# Patient Record
Sex: Female | Born: 1950 | Race: White | Hispanic: No | Smoking: Never smoker
Health system: Southern US, Community
[De-identification: ages and names within clinical notes are randomized; demographics above are authoritative.]

## PROBLEM LIST (undated history)

## (undated) DIAGNOSIS — I10 Essential (primary) hypertension: Secondary | ICD-10-CM

## (undated) DIAGNOSIS — E119 Type 2 diabetes mellitus without complications: Secondary | ICD-10-CM

## (undated) HISTORY — PX: CARDIAC CATHETERIZATION: SHX172

## (undated) HISTORY — PX: CHOLECYSTECTOMY: SHX55

---

## 2018-01-09 DIAGNOSIS — Z794 Long term (current) use of insulin: Secondary | ICD-10-CM

## 2018-01-10 DIAGNOSIS — E0590 Thyrotoxicosis, unspecified without thyrotoxic crisis or storm: Secondary | ICD-10-CM | POA: Diagnosis present

## 2018-02-15 DIAGNOSIS — I483 Typical atrial flutter: Secondary | ICD-10-CM | POA: Insufficient documentation

## 2018-02-16 DIAGNOSIS — Z6841 Body Mass Index (BMI) 40.0 and over, adult: Secondary | ICD-10-CM

## 2018-02-16 DIAGNOSIS — Z7901 Long term (current) use of anticoagulants: Secondary | ICD-10-CM

## 2018-02-16 DIAGNOSIS — I25810 Atherosclerosis of coronary artery bypass graft(s) without angina pectoris: Secondary | ICD-10-CM | POA: Diagnosis present

## 2018-09-26 DIAGNOSIS — J9611 Chronic respiratory failure with hypoxia: Secondary | ICD-10-CM | POA: Diagnosis present

## 2018-09-26 DIAGNOSIS — E785 Hyperlipidemia, unspecified: Secondary | ICD-10-CM | POA: Diagnosis present

## 2018-09-26 DIAGNOSIS — I480 Paroxysmal atrial fibrillation: Secondary | ICD-10-CM | POA: Diagnosis present

## 2019-02-05 DIAGNOSIS — I361 Nonrheumatic tricuspid (valve) insufficiency: Secondary | ICD-10-CM

## 2019-02-05 DIAGNOSIS — I340 Nonrheumatic mitral (valve) insufficiency: Secondary | ICD-10-CM | POA: Diagnosis not present

## 2019-12-07 ENCOUNTER — Encounter (HOSPITAL_COMMUNITY): Payer: Self-pay | Admitting: Emergency Medicine

## 2019-12-07 ENCOUNTER — Inpatient Hospital Stay (HOSPITAL_COMMUNITY)
Admission: EM | Admit: 2019-12-07 | Discharge: 2019-12-17 | DRG: 481 | Disposition: A | Discharge status: Skilled Nursing Facility | Payer: Medicare PPO | Attending: Internal Medicine | Admitting: Internal Medicine

## 2019-12-07 ENCOUNTER — Other Ambulatory Visit: Payer: Self-pay

## 2019-12-07 ENCOUNTER — Emergency Department (HOSPITAL_COMMUNITY): Payer: Medicare PPO

## 2019-12-07 DIAGNOSIS — K5900 Constipation, unspecified: Secondary | ICD-10-CM | POA: Diagnosis present

## 2019-12-07 DIAGNOSIS — J45909 Unspecified asthma, uncomplicated: Secondary | ICD-10-CM | POA: Diagnosis present

## 2019-12-07 DIAGNOSIS — Y92018 Other place in single-family (private) house as the place of occurrence of the external cause: Secondary | ICD-10-CM | POA: Diagnosis not present

## 2019-12-07 DIAGNOSIS — D62 Acute posthemorrhagic anemia: Secondary | ICD-10-CM | POA: Diagnosis not present

## 2019-12-07 DIAGNOSIS — Y839 Surgical procedure, unspecified as the cause of abnormal reaction of the patient, or of later complication, without mention of misadventure at the time of the procedure: Secondary | ICD-10-CM | POA: Diagnosis not present

## 2019-12-07 DIAGNOSIS — Z880 Allergy status to penicillin: Secondary | ICD-10-CM | POA: Diagnosis not present

## 2019-12-07 DIAGNOSIS — Z20822 Contact with and (suspected) exposure to covid-19: Secondary | ICD-10-CM | POA: Diagnosis present

## 2019-12-07 DIAGNOSIS — M62838 Other muscle spasm: Secondary | ICD-10-CM | POA: Diagnosis present

## 2019-12-07 DIAGNOSIS — Z6841 Body Mass Index (BMI) 40.0 and over, adult: Secondary | ICD-10-CM

## 2019-12-07 DIAGNOSIS — S72142A Displaced intertrochanteric fracture of left femur, initial encounter for closed fracture: Principal | ICD-10-CM

## 2019-12-07 DIAGNOSIS — Z881 Allergy status to other antibiotic agents status: Secondary | ICD-10-CM

## 2019-12-07 DIAGNOSIS — T148XXA Other injury of unspecified body region, initial encounter: Secondary | ICD-10-CM

## 2019-12-07 DIAGNOSIS — Z951 Presence of aortocoronary bypass graft: Secondary | ICD-10-CM | POA: Diagnosis not present

## 2019-12-07 DIAGNOSIS — S40011A Contusion of right shoulder, initial encounter: Secondary | ICD-10-CM | POA: Diagnosis present

## 2019-12-07 DIAGNOSIS — Z79899 Other long term (current) drug therapy: Secondary | ICD-10-CM

## 2019-12-07 DIAGNOSIS — I252 Old myocardial infarction: Secondary | ICD-10-CM

## 2019-12-07 DIAGNOSIS — I25810 Atherosclerosis of coronary artery bypass graft(s) without angina pectoris: Secondary | ICD-10-CM | POA: Diagnosis present

## 2019-12-07 DIAGNOSIS — E785 Hyperlipidemia, unspecified: Secondary | ICD-10-CM | POA: Diagnosis present

## 2019-12-07 DIAGNOSIS — E0590 Thyrotoxicosis, unspecified without thyrotoxic crisis or storm: Secondary | ICD-10-CM | POA: Diagnosis present

## 2019-12-07 DIAGNOSIS — Z955 Presence of coronary angioplasty implant and graft: Secondary | ICD-10-CM | POA: Diagnosis not present

## 2019-12-07 DIAGNOSIS — I110 Hypertensive heart disease with heart failure: Secondary | ICD-10-CM | POA: Diagnosis present

## 2019-12-07 DIAGNOSIS — Z8781 Personal history of (healed) traumatic fracture: Secondary | ICD-10-CM | POA: Diagnosis not present

## 2019-12-07 DIAGNOSIS — W010XXA Fall on same level from slipping, tripping and stumbling without subsequent striking against object, initial encounter: Secondary | ICD-10-CM | POA: Diagnosis present

## 2019-12-07 DIAGNOSIS — Z794 Long term (current) use of insulin: Secondary | ICD-10-CM

## 2019-12-07 DIAGNOSIS — I480 Paroxysmal atrial fibrillation: Secondary | ICD-10-CM | POA: Diagnosis present

## 2019-12-07 DIAGNOSIS — I5032 Chronic diastolic (congestive) heart failure: Secondary | ICD-10-CM | POA: Diagnosis present

## 2019-12-07 DIAGNOSIS — E119 Type 2 diabetes mellitus without complications: Secondary | ICD-10-CM

## 2019-12-07 DIAGNOSIS — W19XXXA Unspecified fall, initial encounter: Secondary | ICD-10-CM

## 2019-12-07 DIAGNOSIS — I10 Essential (primary) hypertension: Secondary | ICD-10-CM | POA: Diagnosis present

## 2019-12-07 DIAGNOSIS — N3281 Overactive bladder: Secondary | ICD-10-CM | POA: Diagnosis present

## 2019-12-07 DIAGNOSIS — Z6838 Body mass index (BMI) 38.0-38.9, adult: Secondary | ICD-10-CM

## 2019-12-07 DIAGNOSIS — I2510 Atherosclerotic heart disease of native coronary artery without angina pectoris: Secondary | ICD-10-CM | POA: Diagnosis present

## 2019-12-07 DIAGNOSIS — J9611 Chronic respiratory failure with hypoxia: Secondary | ICD-10-CM | POA: Diagnosis present

## 2019-12-07 DIAGNOSIS — Z419 Encounter for procedure for purposes other than remedying health state, unspecified: Secondary | ICD-10-CM

## 2019-12-07 DIAGNOSIS — E039 Hypothyroidism, unspecified: Secondary | ICD-10-CM | POA: Diagnosis present

## 2019-12-07 DIAGNOSIS — D649 Anemia, unspecified: Secondary | ICD-10-CM

## 2019-12-07 DIAGNOSIS — Z9981 Dependence on supplemental oxygen: Secondary | ICD-10-CM

## 2019-12-07 DIAGNOSIS — M25562 Pain in left knee: Secondary | ICD-10-CM

## 2019-12-07 DIAGNOSIS — Z7901 Long term (current) use of anticoagulants: Secondary | ICD-10-CM

## 2019-12-07 HISTORY — DX: Essential (primary) hypertension: I10

## 2019-12-07 HISTORY — DX: Type 2 diabetes mellitus without complications: E11.9

## 2019-12-07 LAB — CBC WITH DIFFERENTIAL/PLATELET
Abs Immature Granulocytes: 0.09 10*3/uL — ABNORMAL HIGH (ref 0.00–0.07)
Basophils Absolute: 0 10*3/uL (ref 0.0–0.1)
Basophils Relative: 0 %
Eosinophils Absolute: 0.1 10*3/uL (ref 0.0–0.5)
Eosinophils Relative: 1 %
HCT: 36.2 % (ref 36.0–46.0)
Hemoglobin: 11.6 g/dL — ABNORMAL LOW (ref 12.0–15.0)
Immature Granulocytes: 1 %
Lymphocytes Relative: 17 %
Lymphs Abs: 1.9 10*3/uL (ref 0.7–4.0)
MCH: 28.9 pg (ref 26.0–34.0)
MCHC: 32 g/dL (ref 30.0–36.0)
MCV: 90.3 fL (ref 80.0–100.0)
Monocytes Absolute: 0.6 10*3/uL (ref 0.1–1.0)
Monocytes Relative: 6 %
Neutro Abs: 8.4 10*3/uL — ABNORMAL HIGH (ref 1.7–7.7)
Neutrophils Relative %: 75 %
Platelets: 245 10*3/uL (ref 150–400)
RBC: 4.01 MIL/uL (ref 3.87–5.11)
RDW: 13.2 % (ref 11.5–15.5)
WBC: 11.2 10*3/uL — ABNORMAL HIGH (ref 4.0–10.5)
nRBC: 0 % (ref 0.0–0.2)

## 2019-12-07 LAB — HEMOGLOBIN A1C
Hgb A1c MFr Bld: 7.5 % — ABNORMAL HIGH (ref 4.8–5.6)
Mean Plasma Glucose: 168.55 mg/dL

## 2019-12-07 LAB — TYPE AND SCREEN
ABO/RH(D): O POS
Antibody Screen: NEGATIVE

## 2019-12-07 LAB — RESPIRATORY PANEL BY RT PCR (FLU A&B, COVID)
Influenza A by PCR: NEGATIVE
Influenza B by PCR: NEGATIVE
SARS Coronavirus 2 by RT PCR: NEGATIVE

## 2019-12-07 LAB — COMPREHENSIVE METABOLIC PANEL
ALT: 12 U/L (ref 0–44)
AST: 16 U/L (ref 15–41)
Albumin: 3.4 g/dL — ABNORMAL LOW (ref 3.5–5.0)
Alkaline Phosphatase: 52 U/L (ref 38–126)
Anion gap: 6 (ref 5–15)
BUN: 14 mg/dL (ref 8–23)
CO2: 27 mmol/L (ref 22–32)
Calcium: 8.4 mg/dL — ABNORMAL LOW (ref 8.9–10.3)
Chloride: 103 mmol/L (ref 98–111)
Creatinine, Ser: 0.73 mg/dL (ref 0.44–1.00)
GFR calc Af Amer: >60 mL/min (ref >60)
GFR calc non Af Amer: >60 mL/min (ref >60)
Glucose, Bld: 180 mg/dL — ABNORMAL HIGH (ref 70–99)
Potassium: 4.3 mmol/L (ref 3.5–5.1)
Sodium: 136 mmol/L (ref 135–145)
Total Bilirubin: 0.8 mg/dL (ref 0.3–1.2)
Total Protein: 6.7 g/dL (ref 6.5–8.1)

## 2019-12-07 LAB — ABO/RH: ABO/RH(D): O POS

## 2019-12-07 LAB — GLUCOSE, CAPILLARY: Glucose-Capillary: 286 mg/dL — ABNORMAL HIGH (ref 70–99)

## 2019-12-07 MED ORDER — PANTOPRAZOLE SODIUM 40 MG PO TBEC
40.0000 mg | DELAYED_RELEASE_TABLET | Freq: Every day | ORAL | Status: DC
  Administered 2019-12-08 – 2019-12-17 (×9): 40 mg via ORAL
  Filled 2019-12-07 (×10): qty 1

## 2019-12-07 MED ORDER — EZETIMIBE 10 MG PO TABS
10.0000 mg | ORAL_TABLET | Freq: Every day | ORAL | Status: DC
  Administered 2019-12-08 – 2019-12-16 (×9): 10 mg via ORAL
  Filled 2019-12-07 (×10): qty 1

## 2019-12-07 MED ORDER — ONDANSETRON HCL 4 MG PO TABS: 4.0000 mg | ORAL_TABLET | Freq: Four times a day (QID) | ORAL | Status: DC | PRN

## 2019-12-07 MED ORDER — MIRABEGRON ER 50 MG PO TB24
50.0000 mg | ORAL_TABLET | Freq: Every day | ORAL | Status: DC
  Administered 2019-12-08 – 2019-12-17 (×9): 50 mg via ORAL
  Filled 2019-12-07 (×11): qty 1

## 2019-12-07 MED ORDER — TIZANIDINE HCL 4 MG PO TABS
4.0000 mg | ORAL_TABLET | Freq: Three times a day (TID) | ORAL | Status: DC | PRN
  Administered 2019-12-08 – 2019-12-16 (×13): 4 mg via ORAL
  Filled 2019-12-07 (×13): qty 1

## 2019-12-07 MED ORDER — FUROSEMIDE 20 MG PO TABS
20.0000 mg | ORAL_TABLET | Freq: Every day | ORAL | Status: DC
  Administered 2019-12-10 – 2019-12-17 (×5): 20 mg via ORAL
  Filled 2019-12-07 (×10): qty 1

## 2019-12-07 MED ORDER — ONDANSETRON HCL 4 MG/2ML IJ SOLN: 4.0000 mg | Freq: Four times a day (QID) | INTRAMUSCULAR | Status: DC | PRN

## 2019-12-07 MED ORDER — LINAGLIPTIN 5 MG PO TABS
5.0000 mg | ORAL_TABLET | Freq: Every day | ORAL | Status: DC
  Administered 2019-12-08 – 2019-12-17 (×9): 5 mg via ORAL
  Filled 2019-12-07 (×10): qty 1

## 2019-12-07 MED ORDER — HYDROMORPHONE HCL 1 MG/ML IJ SOLN
1.0000 mg | Freq: Once | INTRAMUSCULAR | Status: AC
  Administered 2019-12-07: 1 mg via INTRAVENOUS
  Filled 2019-12-07: qty 1

## 2019-12-07 MED ORDER — RANOLAZINE ER 500 MG PO TB12
500.0000 mg | ORAL_TABLET | Freq: Two times a day (BID) | ORAL | Status: DC
  Administered 2019-12-08 – 2019-12-17 (×18): 500 mg via ORAL
  Filled 2019-12-07 (×21): qty 1

## 2019-12-07 MED ORDER — ONDANSETRON HCL 4 MG/2ML IJ SOLN
4.0000 mg | Freq: Once | INTRAMUSCULAR | Status: AC
  Administered 2019-12-07: 17:00:00 4 mg via INTRAVENOUS
  Filled 2019-12-07: qty 2

## 2019-12-07 MED ORDER — INSULIN ASPART 100 UNIT/ML ~~LOC~~ SOLN
0.0000 [IU] | Freq: Every day | SUBCUTANEOUS | Status: DC
  Administered 2019-12-07: 3 [IU] via SUBCUTANEOUS
  Administered 2019-12-09: 4 [IU] via SUBCUTANEOUS
  Administered 2019-12-10: 22:00:00 3 [IU] via SUBCUTANEOUS

## 2019-12-07 MED ORDER — INSULIN ASPART 100 UNIT/ML ~~LOC~~ SOLN
0.0000 [IU] | Freq: Three times a day (TID) | SUBCUTANEOUS | Status: DC
  Administered 2019-12-08: 8 [IU] via SUBCUTANEOUS
  Administered 2019-12-08 (×2): 3 [IU] via SUBCUTANEOUS
  Administered 2019-12-09: 18:00:00 5 [IU] via SUBCUTANEOUS
  Administered 2019-12-09: 08:00:00 3 [IU] via SUBCUTANEOUS
  Administered 2019-12-10: 8 [IU] via SUBCUTANEOUS
  Administered 2019-12-10: 5 [IU] via SUBCUTANEOUS

## 2019-12-07 MED ORDER — INSULIN GLARGINE 100 UNIT/ML ~~LOC~~ SOLN
33.0000 [IU] | Freq: Every day | SUBCUTANEOUS | Status: DC
  Administered 2019-12-07 – 2019-12-09 (×3): 33 [IU] via SUBCUTANEOUS
  Filled 2019-12-07 (×4): qty 0.33

## 2019-12-07 MED ORDER — HYDROMORPHONE HCL 1 MG/ML IJ SOLN
1.0000 mg | INTRAMUSCULAR | Status: DC | PRN
  Administered 2019-12-07 – 2019-12-09 (×8): 1 mg via INTRAVENOUS
  Filled 2019-12-07 (×8): qty 1

## 2019-12-07 MED ORDER — PRAVASTATIN SODIUM 40 MG PO TABS
40.0000 mg | ORAL_TABLET | Freq: Every day | ORAL | Status: DC
  Administered 2019-12-08 – 2019-12-16 (×9): 40 mg via ORAL
  Filled 2019-12-07 (×9): qty 1

## 2019-12-07 MED ORDER — METOPROLOL SUCCINATE ER 25 MG PO TB24
25.0000 mg | ORAL_TABLET | Freq: Every day | ORAL | Status: DC
  Administered 2019-12-09 – 2019-12-17 (×6): 25 mg via ORAL
  Filled 2019-12-07 (×10): qty 1

## 2019-12-07 NOTE — ED Triage Notes (Signed)
Pt arrives via EMS from home with reports of a fall. Pt got tangled in O2 cord and fell. Deformity to left hip. 100 mcg fentanyl given.

## 2019-12-07 NOTE — ED Notes (Signed)
ED TO INPATIENT HANDOFF REPORT  ED Nurse Name and Phone #:  [phone]   S Name/Age/Gender Kelly Lee 69 y.o. female Room/Bed: 025C/025C  Code Status   Code Status: Not on file  Home/SNF/Other Home Patient oriented to: self, place, time and situation Is this baseline? Yes   Triage Complete: Triage complete  Chief Complaint S/p left hip fracture [Z87.81]  Triage Note Pt arrives via EMS from home with reports of a fall. Pt got tangled in O2 cord and fell. Deformity to left hip. 100 mcg fentanyl given.     Allergies Allergies  Allergen Reactions  . Penicillins Hives and Shortness Of Breath  . Azithromycin Hives  . Drug Ingredient [Cephalexin] Hives    Level of Care/Admitting Diagnosis ED Disposition    ED Disposition Condition Comment   Admit  [facility] [[id number]]  Level of Care: Telemetry Medical [104]  Covid Evaluation: Confirmed COVID Negative  Date Laboratory Confirmed COVID Negative: 12/07/2019  Diagnosis: S/p left hip fracture [[id number]]  Admitting Physician: Truett Mainland [[id number]]  Attending Physician: Truett Mainland [[id number]]  Estimated length of stay: past midnight tomorrow  Certification:: I certify this patient will need inpatient services for at least 2 midnights       B Medical/Surgery History Past Medical History:  Diagnosis Date  . Diabetes mellitus without complication ([place])   . Hypertension    Past Surgical History:  Procedure Laterality Date  . CARDIAC CATHETERIZATION    . CHOLECYSTECTOMY       A IV Location/Drains/Wounds Patient Lines/Drains/Airways Status   Active Line/Drains/Airways    Name:   Placement date:   Placement time:   Site:   Days:   Peripheral IV 12/07/19 Right Antecubital   12/07/19    1500    Antecubital   less than 1          Intake/Output Last 24 hours No intake or output data in the 24 hours ending 12/07/19 1928  Labs/Imaging Results for orders placed or performed during  the hospital encounter of 12/07/19 (from the past 48 hour(s))  Type and screen [place]     Status: None   Collection Time: 12/07/19  3:31 PM  Result Value Ref Range   ABO/RH(D) O POS    Antibody Screen NEG    Sample Expiration      12/10/2019,2359 Performed at [facility], [place] [address], [place], [place] [address]   ABO/Rh     Status: None   Collection Time: 12/07/19  3:31 PM  Result Value Ref Range   ABO/RH(D)      O POS Performed at [facility] [address] [place], [place] [address]   Comprehensive metabolic panel     Status: Abnormal   Collection Time: 12/07/19  3:32 PM  Result Value Ref Range   Sodium 136 135 - 145 mmol/L   Potassium 4.3 3.5 - 5.1 mmol/L   Chloride 103 98 - 111 mmol/L   CO2 27 22 - 32 mmol/L   Glucose, Bld 180 (H) 70 - 99 mg/dL   BUN 14 8 - 23 mg/dL   Creatinine, Ser 0.73 0.44 - 1.00 mg/dL   Calcium 8.4 (L) 8.9 - 10.3 mg/dL   Total Protein 6.7 6.5 - 8.1 g/dL   Albumin 3.4 (L) 3.5 - 5.0 g/dL   AST 16 15 - 41 U/L   ALT 12 0 - 44 U/L   Alkaline Phosphatase 52 38 - 126  U/L   Total Bilirubin 0.8 0.3 - 1.2 mg/dL   GFR calc non Af Amer >60 >60 mL/min   GFR calc Af Amer >60 >60 mL/min   Anion gap 6 5 - 15    Comment: Performed at [facility], [address] [place], [place] [address]  CBC with Differential     Status: Abnormal   Collection Time: 12/07/19  3:32 PM  Result Value Ref Range   WBC 11.2 (H) 4.0 - 10.5 K/uL   RBC 4.01 3.87 - 5.11 MIL/uL   Hemoglobin 11.6 (L) 12.0 - 15.0 g/dL   HCT 87.5 64.3 - 32.9 %   MCV 90.3 80.0 - 100.0 fL   MCH 28.9 26.0 - 34.0 pg   MCHC 32.0 30.0 - 36.0 g/dL   RDW 51.8 84.1 - 66.0 %   Platelets 245 150 - 400 K/uL   nRBC 0.0 0.0 - 0.2 %   Neutrophils Relative % 75 %   Neutro Abs 8.4 (H) 1.7 - 7.7 K/uL   Lymphocytes Relative 17 %   Lymphs Abs 1.9 0.7 - 4.0 K/uL   Monocytes Relative 6 %   Monocytes Absolute 0.6 0.1 - 1.0 K/uL   Eosinophils Relative 1 %   Eosinophils  Absolute 0.1 0.0 - 0.5 K/uL   Basophils Relative 0 %   Basophils Absolute 0.0 0.0 - 0.1 K/uL   Immature Granulocytes 1 %   Abs Immature Granulocytes 0.09 (H) 0.00 - 0.07 K/uL    Comment: Performed at [facility], [address], [place], [place] [address]  Respiratory Panel by RT PCR (Flu A&B, Covid) - Nasopharyngeal Swab     Status: None   Collection Time: 12/07/19  4:53 PM   Specimen: Nasopharyngeal Swab  Result Value Ref Range   SARS Coronavirus 2 by RT PCR NEGATIVE NEGATIVE    Comment: (NOTE) SARS-CoV-2 target nucleic acids are NOT DETECTED. The SARS-CoV-2 RNA is generally detectable in upper respiratoy specimens during the acute phase of infection. The lowest concentration of SARS-CoV-2 viral copies this assay can detect is 131 copies/mL. A negative result does not preclude SARS-Cov-2 infection and should not be used as the sole basis for treatment or other patient management decisions. A negative result may occur with  improper specimen collection/handling, submission of specimen other than nasopharyngeal swab, presence of viral mutation(s) within the areas targeted by this assay, and inadequate number of viral copies (<131 copies/mL). A negative result must be combined with clinical observations, patient history, and epidemiological information. The expected result is Negative. Fact Sheet for Patients:  https://www.moore.com/ Fact Sheet for Healthcare Providers:  https://www.young.biz/ This test is not yet ap proved or cleared by the [place] FDA and  has been authorized for detection and/or diagnosis of SARS-CoV-2 by FDA under an Emergency Use Authorization (EUA). This EUA will remain  in effect (meaning this test can be used) for the duration of the COVID-19 declaration under Section 564(b)(1) of the Act, 21 U.S.C. section 360bbb-3(b)(1), unless the authorization is terminated or revoked sooner.    Influenza A by PCR  NEGATIVE NEGATIVE   Influenza B by PCR NEGATIVE NEGATIVE    Comment: (NOTE) The Xpert Xpress SARS-CoV-2/FLU/RSV assay is intended as an aid in  the diagnosis of influenza from Nasopharyngeal swab specimens and  should not be used as a sole basis for treatment. Nasal washings and  aspirates are unacceptable for Xpert Xpress SARS-CoV-2/FLU/RSV  testing. Fact Sheet for Patients: https://www.moore.com/ Fact Sheet for Healthcare Providers:  https://www.young.biz/ This test is not yet approved or cleared by the [place] and  has been authorized for detection and/or diagnosis of SARS-CoV-2 by  FDA under an Emergency Use Authorization (EUA). This EUA will remain  in effect (meaning this test can be used) for the duration of the  Covid-19 declaration under Section 564(b)(1) of the Act, 21  U.S.C. section 360bbb-3(b)(1), unless the authorization is  terminated or revoked. Performed at [facility], [address], [place], [place] [address]    DG Chest 1 View  Result Date: 12/07/2019 CLINICAL DATA:  Status post fall. EXAM: CHEST  1 VIEW COMPARISON:  February 15, 2018 FINDINGS: Multiple sternal wires and vascular clips are seen. Mild to moderate severity diffusely increased interstitial lung markings are noted. There is no evidence of focal consolidation, pleural effusion or pneumothorax. The cardiac silhouette is markedly enlarged. Multiple left-sided rib fractures of indeterminate age are seen. Multilevel degenerative changes seen throughout the thoracic spine. IMPRESSION: 1. Evidence of prior median sternotomy/CABG. 2. Chronic appearing increased interstitial lung markings, without evidence of acute or active cardiopulmonary disease. 3. Multiple left-sided rib fractures of indeterminate age. These are not clearly identified on the prior study and may be acute in nature. Electronically Signed   By: Aram Candela M.D.   On: 12/07/2019 16:48   DG  Shoulder Right  Result Date: 12/07/2019 CLINICAL DATA:  Status post fall. EXAM: RIGHT SHOULDER - 2+ VIEW COMPARISON:  None. FINDINGS: There is no evidence of fracture or dislocation. A single radiopaque surgical screw is seen overlying the right humeral head. Approximately 1.8 cm widening of the right acromioclavicular joint is noted. Soft tissues are unremarkable. IMPRESSION: 1. Postoperative changes without evidence of an acute osseous abnormality. Electronically Signed   By: Aram Candela M.D.   On: 12/07/2019 16:45   DG Hip Unilat With Pelvis 2-3 Views Left  Result Date: 12/07/2019 CLINICAL DATA:  Status post fall. EXAM: DG HIP (WITH OR WITHOUT PELVIS) 2-3V LEFT COMPARISON:  None. FINDINGS: Acute fracture deformity is seen extending through the inter trochanteric region of the proximal left femur. There is superior and lateral displacement of the distal fracture site with superior angulation of the fracture apex. There is no evidence of dislocation. Soft tissue structures are unremarkable. IMPRESSION: 1. Acute inter trochanteric fracture of the proximal left femur. Electronically Signed   By: Aram Candela M.D.   On: 12/07/2019 16:44    Pending Labs [employer] (From admission, onward)    Start     Ordered   Signed and Held  Hemoglobin A1c  Once,   R    Comments: To assess prior glycemic control    Signed and Held   Signed and Held  Basic metabolic panel  Tomorrow morning,   R     Signed and Held   Signed and Held  CBC  Tomorrow morning,   R     Signed and Held          Vitals/Pain Today's Vitals   12/07/19 1845 12/07/19 1900 12/07/19 1915 12/07/19 1925  BP:  (!) 107/91    Pulse: (!) 45  (!) 47   Resp: (!) 21 13 18    Temp:      TempSrc:      SpO2: 99%  97%   Weight:      Height:      PainSc:    2     Isolation Precautions No active isolations  Medications Medications  HYDROmorphone (DILAUDID)  injection 1 mg (1 mg Intravenous Given 12/07/19 1827)   HYDROmorphone (DILAUDID) injection 1 mg (1 mg Intravenous Given 12/07/19 1528)  ondansetron (ZOFRAN) injection 4 mg (4 mg Intravenous Given 12/07/19 1655)    Mobility non-ambulatory High fall risk   Focused Assessments [facility]CU   R Recommendations: See Admitting Provider Note  Report given to:   Additional Notes:

## 2019-12-07 NOTE — H&P (Signed)
History and Physical  Kelly Lee WUJ:[id number] DOB: 1951-08-28 DOA: 12/07/2019  Referring physician: Eustaquio Maize, Hershal Coria, ED physician PCP: Theodosia Blender, PA-C  Outpatient Specialists:   Patient Coming From: home  Chief Complaint: left hip pain  HPI: Kelly Lee is a 69 y.o. female with a history of atrial fib/flutter on anticoagulation, hypertension, diabetes, chronic systolic heart failure with EF 40 to 45% in 2019.  Patient had a fall at home: She tripped over her shoes and fell on her left hip.  She immediately had pain and difficulty moving.  Pain worse with movement.  Pain is gotten worse as time is progressed.  Pain is improved with rest and pain medicine.  No other palliating or provoking factors.  Emergency Department Course: X-ray shows left trochanteric fracture.  Otherwise stable  Review of Systems:   Pt denies any fevers, chills, nausea, vomiting, diarrhea, constipation, abdominal pain, shortness of breath, dyspnea on exertion, orthopnea, cough, wheezing, palpitations, headache, vision changes, lightheadedness, dizziness, melena, rectal bleeding.  Review of systems are otherwise negative  Past Medical History:  Diagnosis Date  . Diabetes mellitus without complication ([place])   . Hypertension    History of cardiac surgery, cholecystectomy Social History:  reports that she has never smoked. She has never used smokeless tobacco. She reports that she does not use drugs. No history on file for alcohol. Patient lives at home  Allergies  Allergen Reactions  . Penicillins Hives and Shortness Of Breath  . Azithromycin Hives  . Drug Ingredient [Cephalexin] Hives    No family history on file.  Family history of hypertension and diabetes  Prior to Admission medications   Medication Sig Start Date End Date Taking? Authorizing Provider  aspirin 81 MG EC tablet Take 81 mg by mouth daily as needed for pain.    Yes [provider]  ELIQUIS 5 MG TABS tablet Take  5 mg by mouth every 12 (twelve) hours. 09/24/19  Yes [provider]  ezetimibe (ZETIA) 10 MG tablet Take 10 mg by mouth daily. 11/16/19  Yes [provider]  furosemide (LASIX) 40 MG tablet Take 20 mg by mouth daily.  10/17/19  Yes [provider]  JANUVIA 100 MG tablet Take 100 mg by mouth daily. 10/17/19  Yes [provider]  LANTUS SOLOSTAR 100 UNIT/ML Solostar Pen Inject 33 Units into the skin at bedtime. 11/21/19  Yes [provider]  metFORMIN (GLUCOPHAGE) 500 MG tablet Take 500 mg by mouth 3 (three) times daily. 11/16/19  Yes [provider]  metoprolol succinate (TOPROL-XL) 50 MG 24 hr tablet Take 25 mg by mouth daily. 10/09/19  Yes [provider]  MYRBETRIQ 50 MG TB24 tablet Take 50 mg by mouth daily. 10/09/19  Yes [provider]  nitroGLYCERIN (NITROSTAT) 0.4 MG SL tablet Place 0.4 mg under the tongue every 5 (five) minutes as needed for chest pain.    Yes [provider]  pantoprazole (PROTONIX) 40 MG tablet Take 40 mg by mouth daily. 10/17/19  Yes [provider]  pravastatin (PRAVACHOL) 40 MG tablet Take 40 mg by mouth at bedtime. 11/19/19  Yes [provider]  ranolazine (RANEXA) 500 MG 12 hr tablet Take 500 mg by mouth 2 (two) times daily. 11/04/19  Yes [provider]  tiZANidine (ZANAFLEX) 4 MG tablet Take 4 mg by mouth 3 (three) times daily as needed for muscle spasms.  11/13/19  Yes [provider]    Physical Exam: BP (!) 128/95   Pulse (!) 42  Temp 98.5 F (36.9 C) (Oral)   Resp 16   Ht 5\' 1"  (1.549 m)   Wt 93 kg   SpO2 97%   BMI 38.73 kg/m   . General: Elderly female. Awake and alert and oriented x3. No acute cardiopulmonary distress.  HEENT: Normocephalic atraumatic.  Right and left ears normal in appearance.  Pupils equal, round, reactive to light. Extraocular muscles are intact. Sclerae anicteric and noninjected.  Moist mucosal membranes. No mucosal  lesions.  . Neck: Neck supple without lymphadenopathy. No carotid bruits. No masses palpated.  . Cardiovascular: Regular rate with normal S1-S2 sounds. No murmurs, rubs, gallops auscultated. No JVD.  Marland Kitchen Respiratory: Good respiratory effort with no wheezes, rales, rhonchi. Lungs clear to auscultation bilaterally.  No accessory muscle use. . Abdomen: Soft, nontender, nondistended. Active bowel sounds. No masses or hepatosplenomegaly  . Skin: No rashes, lesions, or ulcerations.  Dry, warm to touch. 2+ dorsalis pedis and radial pulses. . Musculoskeletal: Right leg shortened and externally rotated.  Pain in left hip.  Neurovascularly intact distal to the hip.  No other major joint problems no contractures  . Psychiatric: Intact judgment and insight. Pleasant and cooperative. . Neurologic: No focal neurological deficits. Strength is 5/5 and symmetric in upper and lower extremities.  Cranial nerves II through XII are grossly intact.           Labs on Admission: I have personally reviewed following labs and imaging studies  CBC: Recent Labs  Lab 12/07/19 1532  WBC 11.2*  NEUTROABS 8.4*  HGB 11.6*  HCT 36.2  MCV 90.3  PLT 245   Basic Metabolic Panel: Recent Labs  Lab 12/07/19 1532  NA 136  K 4.3  CL 103  CO2 27  GLUCOSE 180*  BUN 14  CREATININE 0.73  CALCIUM 8.4*   GFR: Estimated Creatinine Clearance: 69 mL/min (by C-G formula based on SCr of 0.73 mg/dL). Liver Function Tests: Recent Labs  Lab 12/07/19 1532  AST 16  ALT 12  ALKPHOS 52  BILITOT 0.8  PROT 6.7  ALBUMIN 3.4*   No results for input(s): LIPASE, AMYLASE in the last 168 hours. No results for input(s): AMMONIA in the last 168 hours. Coagulation Profile: No results for input(s): INR, PROTIME in the last 168 hours. Cardiac Enzymes: No results for input(s): CKTOTAL, CKMB, CKMBINDEX, TROPONINI in the last 168 hours. BNP (last 3 results) No results for input(s): PROBNP in the last 8760 hours. HbA1C: No results  for input(s): HGBA1C in the last 72 hours. CBG: No results for input(s): GLUCAP in the last 168 hours. Lipid Profile: No results for input(s): CHOL, HDL, LDLCALC, TRIG, CHOLHDL, LDLDIRECT in the last 72 hours. Thyroid Function Tests: No results for input(s): TSH, T4TOTAL, FREET4, T3FREE, THYROIDAB in the last 72 hours. Anemia Panel: No results for input(s): VITAMINB12, FOLATE, FERRITIN, TIBC, IRON, RETICCTPCT in the last 72 hours. Urine analysis: No results found for: COLORURINE, APPEARANCEUR, LABSPEC, PHURINE, GLUCOSEU, HGBUR, BILIRUBINUR, KETONESUR, PROTEINUR, UROBILINOGEN, NITRITE, LEUKOCYTESUR Sepsis Labs: @LABRCNTIP (procalcitonin:4,lacticidven:4) ) Recent Results (from the past 240 hour(s))  Respiratory Panel by RT PCR (Flu A&B, Covid) - Nasopharyngeal Swab     Status: None   Collection Time: 12/07/19  4:53 PM   Specimen: Nasopharyngeal Swab  Result Value Ref Range Status   SARS Coronavirus 2 by RT PCR NEGATIVE NEGATIVE Final    Comment: (NOTE) SARS-CoV-2 target nucleic acids are NOT DETECTED. The SARS-CoV-2 RNA is generally detectable in upper respiratoy specimens during the acute phase of infection. The lowest  concentration of SARS-CoV-2 viral copies this assay can detect is 131 copies/mL. A negative result does not preclude SARS-Cov-2 infection and should not be used as the sole basis for treatment or other patient management decisions. A negative result may occur with  improper specimen collection/handling, submission of specimen other than nasopharyngeal swab, presence of viral mutation(s) within the areas targeted by this assay, and inadequate number of viral copies (<131 copies/mL). A negative result must be combined with clinical observations, patient history, and epidemiological information. The expected result is Negative. Fact Sheet for Patients:  https://www.moore.com/ Fact Sheet for Healthcare Providers:   https://www.young.biz/ This test is not yet ap proved or cleared by the [place] FDA and  has been authorized for detection and/or diagnosis of SARS-CoV-2 by FDA under an Emergency Use Authorization (EUA). This EUA will remain  in effect (meaning this test can be used) for the duration of the COVID-19 declaration under Section 564(b)(1) of the Act, 21 U.S.C. section 360bbb-3(b)(1), unless the authorization is terminated or revoked sooner.    Influenza A by PCR NEGATIVE NEGATIVE Final   Influenza B by PCR NEGATIVE NEGATIVE Final    Comment: (NOTE) The Xpert Xpress SARS-CoV-2/FLU/RSV assay is intended as an aid in  the diagnosis of influenza from Nasopharyngeal swab specimens and  should not be used as a sole basis for treatment. Nasal washings and  aspirates are unacceptable for Xpert Xpress SARS-CoV-2/FLU/RSV  testing. Fact Sheet for Patients: https://www.moore.com/ Fact Sheet for Healthcare Providers: https://www.young.biz/ This test is not yet approved or cleared by the [place] FDA and  has been authorized for detection and/or diagnosis of SARS-CoV-2 by  FDA under an Emergency Use Authorization (EUA). This EUA will remain  in effect (meaning this test can be used) for the duration of the  Covid-19 declaration under Section 564(b)(1) of the Act, 21  U.S.C. section 360bbb-3(b)(1), unless the authorization is  terminated or revoked. Performed at [facility], [address], [place], [place] [address]      Radiological Exams on Admission: DG Chest 1 View  Result Date: 12/07/2019 CLINICAL DATA:  Status post fall. EXAM: CHEST  1 VIEW COMPARISON:  February 15, 2018 FINDINGS: Multiple sternal wires and vascular clips are seen. Mild to moderate severity diffusely increased interstitial lung markings are noted. There is no evidence of focal consolidation, pleural effusion or pneumothorax. The cardiac silhouette is  markedly enlarged. Multiple left-sided rib fractures of indeterminate age are seen. Multilevel degenerative changes seen throughout the thoracic spine. IMPRESSION: 1. Evidence of prior median sternotomy/CABG. 2. Chronic appearing increased interstitial lung markings, without evidence of acute or active cardiopulmonary disease. 3. Multiple left-sided rib fractures of indeterminate age. These are not clearly identified on the prior study and may be acute in nature. Electronically Signed   By: Aram Candela M.D.   On: 12/07/2019 16:48   DG Shoulder Right  Result Date: 12/07/2019 CLINICAL DATA:  Status post fall. EXAM: RIGHT SHOULDER - 2+ VIEW COMPARISON:  None. FINDINGS: There is no evidence of fracture or dislocation. A single radiopaque surgical screw is seen overlying the right humeral head. Approximately 1.8 cm widening of the right acromioclavicular joint is noted. Soft tissues are unremarkable. IMPRESSION: 1. Postoperative changes without evidence of an acute osseous abnormality. Electronically Signed   By: Aram Candela M.D.   On: 12/07/2019 16:45   DG Hip Unilat With Pelvis 2-3 Views Left  Result Date: 12/07/2019 CLINICAL DATA:  Status post fall. EXAM: DG HIP (WITH OR WITHOUT  PELVIS) 2-3V LEFT COMPARISON:  None. FINDINGS: Acute fracture deformity is seen extending through the inter trochanteric region of the proximal left femur. There is superior and lateral displacement of the distal fracture site with superior angulation of the fracture apex. There is no evidence of dislocation. Soft tissue structures are unremarkable. IMPRESSION: 1. Acute inter trochanteric fracture of the proximal left femur. Electronically Signed   By: Aram Candela M.D.   On: 12/07/2019 16:44    EKG: Independently reviewed.   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  We also discussed care given by the ED provider.  1. Left hip fracture a. Admit b. Pain control c. Surgery likely on Tuesday d. No anticoagulation e. SCDs 2. Diabetes a. Hold [place] b. Continue Tradjenta c. Insulin with sliding scale 3. Coronary artery disease, PAF on long-term anticoagulation a. Continue beta-blocker b. Hold anticoagulation 4. Chronic respiratory failure with hypoxia a. Continue oxygen 5. Hypothyroidism a. Continue Synthroid 6. Obesity 7. Hypertension a. Continue antihypertensives  DVT prophylaxis: SCDs Consultants: Ortho Code Status: Full code Family Communication: None Disposition Plan: SNF   Levie Heritage, DO

## 2019-12-07 NOTE — ED Provider Notes (Signed)
[facility] EMERGENCY DEPARTMENT Provider Note   CSN: [id number] Arrival date & time: 12/07/19  1501     History Chief Complaint  Patient presents with  . Fall  . Hip Injury    Kelly Lee is a 69 y.o. female with PMHx HTN and Diabetes who presents to the ED today with complaint of sudden onset, constant, 10/10, sharp, left hip pain s/p mechanical fall that occurred just PTA.  Patient reports that she thinks she either got tangled in her oxygen cord or tripped over her dogs and fell landing onto her hip causing immediate pain.  No head injury or loss of consciousness.  Patient states she was unable to get up on her own and called EMS.  Obvious deformity noted to left hip on EMS arrival.  Patient was given 100 mcg of fentanyl in route however she states that with the bumpy drive on the trunk she is having the same amount of pain prior to receiving fentanyl.  Patient denies head injury or loss of consciousness.  States her right shoulder also hurts minimally.  She denies any other complaints today.   The history is provided by the patient and the EMS personnel.       Past Medical History:  Diagnosis Date  . Diabetes mellitus without complication ([place])   . Hypertension     Patient Active Problem List   Diagnosis Date Noted  . S/p left hip fracture 12/07/2019  . Diabetes mellitus without complication ([place])   . Hypertension   . Chronic respiratory failure with hypoxia ([place]) 09/26/2018  . Dyslipidemia 09/26/2018  . PAF (paroxysmal atrial fibrillation) ([place]) 09/26/2018  . Anticoagulant long-term use 02/16/2018  . 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 Morbid obesity with BMI of 40.0-44.9, adult ([place]) 02/16/2018  . Typical atrial flutter ([place]) 02/15/2018  . Hypothyroidism 01/10/2018  . Type 2 diabetes mellitus, with long-term current use of insulin ([place]) 01/09/2018    Past  Surgical History:  Procedure Laterality Date  . CARDIAC CATHETERIZATION    . CHOLECYSTECTOMY       OB History   No obstetric history on file.     History reviewed. No pertinent family history.  Social History   Tobacco Use  . Smoking status: Never Smoker  . Smokeless tobacco: Never Used  Substance Use Topics  . Alcohol use: Not on file  . Drug use: Never    Home Medications Prior to Admission medications   Medication Sig Start Date End Date Taking? Authorizing Provider  aspirin 81 MG EC tablet Take 81 mg by mouth daily as needed for pain.    Yes [provider]  ELIQUIS 5 MG TABS tablet Take 5 mg by mouth every 12 (twelve) hours. 09/24/19  Yes [provider]  ezetimibe (ZETIA) 10 MG tablet Take 10 mg by mouth daily. 11/16/19  Yes [provider]  furosemide (LASIX) 40 MG tablet Take 20 mg by mouth daily.  10/17/19  Yes [provider]  JANUVIA 100 MG tablet Take 100 mg by mouth daily. 10/17/19  Yes [provider]  LANTUS SOLOSTAR 100 UNIT/ML Solostar Pen Inject 33 Units into the skin at bedtime. 11/21/19  Yes [provider]  metFORMIN (GLUCOPHAGE) 500 MG tablet Take 500 mg by mouth 3 (three) times daily. 11/16/19  Yes [provider]  metoprolol succinate (TOPROL-XL) 50 MG 24 hr tablet Take 25 mg by mouth daily. 10/09/19  Yes [provider]  MYRBETRIQ 50 MG TB24 tablet Take 50 mg by mouth daily. 10/09/19  Yes [provider]  nitroGLYCERIN (NITROSTAT) 0.4 MG SL tablet Place 0.4 mg under the tongue every 5 (five) minutes as needed for chest pain.    Yes [provider]  pantoprazole (PROTONIX) 40 MG tablet Take 40 mg by mouth daily. 10/17/19  Yes [provider]  pravastatin (PRAVACHOL) 40 MG tablet Take 40 mg by mouth at bedtime. 11/19/19  Yes [provider]  ranolazine (RANEXA) 500 MG 12 hr tablet Take 500 mg by mouth 2 (two) times daily. 11/04/19  Yes [provider]   tiZANidine (ZANAFLEX) 4 MG tablet Take 4 mg by mouth 3 (three) times daily as needed for muscle spasms.  11/13/19  Yes [provider]    Allergies    Penicillins, Azithromycin, and Drug ingredient [cephalexin]  Review of Systems   Review of Systems  Constitutional: Negative for chills and fever.  Gastrointestinal: Negative for abdominal pain, nausea and vomiting.  Musculoskeletal: Positive for arthralgias.  Neurological: Negative for headaches.  All other systems reviewed and are negative.   Physical Exam Updated Vital Signs BP (!) 149/86   Pulse 67   Temp (!) 97.4 F (36.3 C) (Oral)   Resp (!) 22   Ht 5\' 1"  (1.549 m)   Wt 93 kg   SpO2 98%   BMI 38.73 kg/m   Physical Exam Vitals and nursing note reviewed.  Constitutional:      Appearance: She is not ill-appearing.  HENT:     Head: Normocephalic and atraumatic.     Comments: No raccoon's sign or battle's sign. Negative hemotympanum bilaterally.     Right Ear: Tympanic membrane normal.     Left Ear: Tympanic membrane normal.  Eyes:     Extraocular Movements: Extraocular movements intact.     Conjunctiva/sclera: Conjunctivae normal.     Pupils: Pupils are equal, round, and reactive to light.  Cardiovascular:     Rate and Rhythm: Normal rate and regular rhythm.  Pulmonary:     Effort: Pulmonary effort is normal.     Breath sounds: Normal breath sounds. No wheezing, rhonchi or rales.  Abdominal:     Palpations: Abdomen is soft.     Tenderness: There is no abdominal tenderness. There is no guarding or rebound.  Musculoskeletal:     Cervical back: Neck supple. No tenderness.     Comments: Left leg externally rotated and shortened with TTP to left hip joint. No tenderness to distal femur, knee, tib/fib, ankle, or foot. Sensation intact throughout. Compartments soft. 2+ DP pulse to left. Able to wiggle toes.   Mild tenderness noted to right shoulder however no obvious deformity, ecchymosis, or swelling. ROM  intact to right arm. No tenderness throughout remainder of right arm. Grip strength 5/5 bilaterally.   No tenderness to midline spine.   Skin:    General: Skin is warm and dry.  Neurological:     General: No focal deficit present.     Mental Status: She is alert and oriented to person, place, and time.     Cranial Nerves: No cranial nerve deficit.     Sensory: No sensory deficit.     ED Results / Procedures / Treatments   Labs (all labs ordered are listed, but only abnormal results are displayed) Labs Reviewed  COMPREHENSIVE METABOLIC PANEL - Abnormal; Notable for the following components:      Result Value   Glucose, Bld 180 (*)  Calcium 8.4 (*)    Albumin 3.4 (*)    All other components within normal limits  CBC WITH DIFFERENTIAL/PLATELET - Abnormal; Notable for the following components:   WBC 11.2 (*)    Hemoglobin 11.6 (*)    Neutro Abs 8.4 (*)    Abs Immature Granulocytes 0.09 (*)    All other components within normal limits  RESPIRATORY PANEL BY RT PCR (FLU A&B, COVID)  TYPE AND SCREEN  ABO/RH    EKG EKG Interpretation  Date/Time:  Sunday December 07 2019 15:27:52 EST Ventricular Rate:  67 PR Interval:    QRS Duration: 133 QT Interval:  418 QTC Calculation: 435 R Axis:   38 Text Interpretation: Sinus rhythm No STMEI Confirmed by Alvester Chou [phone]) on 12/07/2019 3:32:48 PM   Radiology DG Chest 1 View  Result Date: 12/07/2019 CLINICAL DATA:  Status post fall. EXAM: CHEST  1 VIEW COMPARISON:  February 15, 2018 FINDINGS: Multiple sternal wires and vascular clips are seen. Mild to moderate severity diffusely increased interstitial lung markings are noted. There is no evidence of focal consolidation, pleural effusion or pneumothorax. The cardiac silhouette is markedly enlarged. Multiple left-sided rib fractures of indeterminate age are seen. Multilevel degenerative changes seen throughout the thoracic spine. IMPRESSION: 1. Evidence of prior median sternotomy/CABG.  2. Chronic appearing increased interstitial lung markings, without evidence of acute or active cardiopulmonary disease. 3. Multiple left-sided rib fractures of indeterminate age. These are not clearly identified on the prior study and may be acute in nature. Electronically Signed   By: Aram Candela M.D.   On: 12/07/2019 16:48   DG Shoulder Right  Result Date: 12/07/2019 CLINICAL DATA:  Status post fall. EXAM: RIGHT SHOULDER - 2+ VIEW COMPARISON:  None. FINDINGS: There is no evidence of fracture or dislocation. A single radiopaque surgical screw is seen overlying the right humeral head. Approximately 1.8 cm widening of the right acromioclavicular joint is noted. Soft tissues are unremarkable. IMPRESSION: 1. Postoperative changes without evidence of an acute osseous abnormality. Electronically Signed   By: Aram Candela M.D.   On: 12/07/2019 16:45   DG Hip Unilat With Pelvis 2-3 Views Left  Result Date: 12/07/2019 CLINICAL DATA:  Status post fall. EXAM: DG HIP (WITH OR WITHOUT PELVIS) 2-3V LEFT COMPARISON:  None. FINDINGS: Acute fracture deformity is seen extending through the inter trochanteric region of the proximal left femur. There is superior and lateral displacement of the distal fracture site with superior angulation of the fracture apex. There is no evidence of dislocation. Soft tissue structures are unremarkable. IMPRESSION: 1. Acute inter trochanteric fracture of the proximal left femur. Electronically Signed   By: Aram Candela M.D.   On: 12/07/2019 16:44    Procedures Procedures (including critical care time)  Medications Ordered in ED Medications  HYDROmorphone (DILAUDID) injection 1 mg (1 mg Intravenous Given 12/07/19 1827)  HYDROmorphone (DILAUDID) injection 1 mg (1 mg Intravenous Given 12/07/19 1528)  ondansetron (ZOFRAN) injection 4 mg (4 mg Intravenous Given 12/07/19 1655)    ED Course  I have reviewed the triage vital signs and the nursing notes.  Pertinent labs &  imaging results that were available during my care of the patient were reviewed by me and considered in my medical decision making (see chart for details).  69 year old female presents the ED after mechanical fall from home with obvious deformity to left hip.  Given 100 mcg of fentanyl in route.  No head injury or loss of consciousness.  Her left leg is shortened  and externally rotated with tenderness to palpation along the left hip, there is concern for fracture.  Will obtain x-ray.  Will obtain screening labs, swab for 2-hour Covid test with plan for likely admission for surgical intervention.  Patient still complaining of pain, 1 mg Dilaudid given.  X-ray with intertrochanteric fracture of the left hip.  Will consult surgery at this time.  Patient reports she is nauseated after returning from x-ray, will give Zofran.   Attending physician Dr. Renaye Rakers has evaluated patient as well and agrees with plan.   Discussed case with Dr. Charlann Boxer who plans for surgery sometime tomorrow. Pt to be made NPO at midnight tonight.   Dr. Adrian Blackwater with [facility] to admit.   Clinical Course as of Dec 06 1920  Wynelle Link Dec 07, 2019  1814 Patient was seen by myself as well as PA provider.  Briefly 69 year old female presenting to ED with mechanical fall on her right hip.  She is found to have an intratrochanteric fracture of the right hip.  She is neurovascularly intact..  She continues to report pain in her hip.  Giving her pain medicine and reach out to orthopedics.  She will be admitted to the hospital.  She denies any head trauma from her fall.  She denies any headache.  X-rays noted some possible rib fractures, she denies any chest wall pain and has no tenderness on exam, she does report she has had falls in the past and believes may be old.   [MT]    Clinical Course User Index [MT] Trifan, Kermit Balo, MD    MDM Rules/Calculators/A&P                       Final Clinical Impression(s) / ED Diagnoses Final diagnoses:   Closed displaced intertrochanteric fracture of left femur, initial encounter [facility])    Rx / DC Orders ED Discharge Orders    None       Tanda Rockers, Cordelia Poche 12/07/19 Lynnea Ferrier, MD 12/07/19 2100

## 2019-12-08 DIAGNOSIS — S72142A Displaced intertrochanteric fracture of left femur, initial encounter for closed fracture: Secondary | ICD-10-CM

## 2019-12-08 HISTORY — DX: Displaced intertrochanteric fracture of left femur, initial encounter for closed fracture: S72.142A

## 2019-12-08 LAB — CBC
HCT: 33.8 % — ABNORMAL LOW (ref 36.0–46.0)
Hemoglobin: 10.6 g/dL — ABNORMAL LOW (ref 12.0–15.0)
MCH: 28.2 pg (ref 26.0–34.0)
MCHC: 31.4 g/dL (ref 30.0–36.0)
MCV: 89.9 fL (ref 80.0–100.0)
Platelets: 229 10*3/uL (ref 150–400)
RBC: 3.76 MIL/uL — ABNORMAL LOW (ref 3.87–5.11)
RDW: 13.4 % (ref 11.5–15.5)
WBC: 10.9 10*3/uL — ABNORMAL HIGH (ref 4.0–10.5)
nRBC: 0 % (ref 0.0–0.2)

## 2019-12-08 LAB — BASIC METABOLIC PANEL
Anion gap: 11 (ref 5–15)
BUN: 12 mg/dL (ref 8–23)
CO2: 26 mmol/L (ref 22–32)
Calcium: 8.9 mg/dL (ref 8.9–10.3)
Chloride: 98 mmol/L (ref 98–111)
Creatinine, Ser: 0.92 mg/dL (ref 0.44–1.00)
GFR calc Af Amer: >60 mL/min (ref >60)
GFR calc non Af Amer: >60 mL/min (ref >60)
Glucose, Bld: 296 mg/dL — ABNORMAL HIGH (ref 70–99)
Potassium: 4.6 mmol/L (ref 3.5–5.1)
Sodium: 135 mmol/L (ref 135–145)

## 2019-12-08 LAB — GLUCOSE, CAPILLARY
Glucose-Capillary: 260 mg/dL — ABNORMAL HIGH (ref 70–99)
Glucose-Capillary: 192 mg/dL — ABNORMAL HIGH (ref 70–99)
Glucose-Capillary: 176 mg/dL — ABNORMAL HIGH (ref 70–99)
Glucose-Capillary: 158 mg/dL — ABNORMAL HIGH (ref 70–99)

## 2019-12-08 LAB — SURGICAL PCR SCREEN
MRSA, PCR: NEGATIVE
Staphylococcus aureus: NEGATIVE

## 2019-12-08 NOTE — Plan of Care (Signed)
  Problem: Pain Managment: Goal: General experience of comfort will improve Outcome: Progressing   

## 2019-12-08 NOTE — Consult Note (Signed)
Reason for Consult: Left hip fracture  Referring Physician:  Nehemiah Settle, MD (Hospitalist)  Kelly Lee is an 69 y.o. female.  HPI: Kelly Lee is a 69 y.o. female with a history of atrial fib/flutter on anticoagulation, hypertension, diabetes, chronic systolic heart failure with EF 40 to 45% in 2019.  Patient had a fall at home: She tripped over her shoes and fell on her left hip.  She immediately had pain and difficulty moving.  Pain worse with movement.  Pain is gotten worse as time is progressed.  Pain is improved with rest and pain medicine.  No other palliating or provoking factors.  Also reports some pain in the right shoulder after her fall.  History of rotator cuff surgery, distal clavicle resection in [place].   Past Medical History:  Diagnosis Date  . Diabetes mellitus without complication ([place])   . Hypertension     Past Surgical History:  Procedure Laterality Date  . CARDIAC CATHETERIZATION    . CHOLECYSTECTOMY      History reviewed. No pertinent family history.  Social History:  reports that she has never smoked. She has never used smokeless tobacco. She reports that she does not use drugs. No history on file for alcohol.  Allergies:  Allergies  Allergen Reactions  . Penicillins Hives and Shortness Of Breath  . Azithromycin Hives  . Drug Ingredient [Cephalexin] Hives    Medications:  I have reviewed the patient's current medications. Scheduled: . ezetimibe  10 mg Oral Daily  . furosemide  20 mg Oral Daily  . insulin aspart  0-15 Units Subcutaneous TID WC  . insulin aspart  0-5 Units Subcutaneous QHS  . insulin glargine  33 Units Subcutaneous QHS  . linagliptin  5 mg Oral Daily  . metoprolol succinate  25 mg Oral Daily  . mirabegron ER  50 mg Oral Daily  . pantoprazole  40 mg Oral Daily  . pravastatin  40 mg Oral QHS  . ranolazine  500 mg Oral BID    Results for orders placed or performed during the hospital encounter of 12/07/19 (from the past 24  hour(s))  Type and screen [place]     Status: None   Collection Time: 12/07/19  3:31 PM  Result Value Ref Range   ABO/RH(D) O POS    Antibody Screen NEG    Sample Expiration      12/10/2019,2359 Performed at [facility], [place] [address], [place], [place] [address]   ABO/Rh     Status: None   Collection Time: 12/07/19  3:31 PM  Result Value Ref Range   ABO/RH(D)      O POS Performed at [facility] [address] [place], [place] [address]   Comprehensive metabolic panel     Status: Abnormal   Collection Time: 12/07/19  3:32 PM  Result Value Ref Range   Sodium 136 135 - 145 mmol/L   Potassium 4.3 3.5 - 5.1 mmol/L   Chloride 103 98 - 111 mmol/L   CO2 27 22 - 32 mmol/L   Glucose, Bld 180 (H) 70 - 99 mg/dL   BUN 14 8 - 23 mg/dL   Creatinine, Ser 0.73 0.44 - 1.00 mg/dL   Calcium 8.4 (L) 8.9 - 10.3 mg/dL   Total Protein 6.7 6.5 - 8.1 g/dL   Albumin 3.4 (L) 3.5 - 5.0 g/dL   AST 16 15 - 41 U/L   ALT 12 0 - 44 U/L   Alkaline Phosphatase 52  38 - 126 U/L   Total Bilirubin 0.8 0.3 - 1.2 mg/dL   GFR calc non Af Amer >60 >60 mL/min   GFR calc Af Amer >60 >60 mL/min   Anion gap 6 5 - 15  CBC with Differential     Status: Abnormal   Collection Time: 12/07/19  3:32 PM  Result Value Ref Range   WBC 11.2 (H) 4.0 - 10.5 K/uL   RBC 4.01 3.87 - 5.11 MIL/uL   Hemoglobin 11.6 (L) 12.0 - 15.0 g/dL   HCT 40.9 81.1 - 91.4 %   MCV 90.3 80.0 - 100.0 fL   MCH 28.9 26.0 - 34.0 pg   MCHC 32.0 30.0 - 36.0 g/dL   RDW 78.2 95.6 - 21.3 %   Platelets 245 150 - 400 K/uL   nRBC 0.0 0.0 - 0.2 %   Neutrophils Relative % 75 %   Neutro Abs 8.4 (H) 1.7 - 7.7 K/uL   Lymphocytes Relative 17 %   Lymphs Abs 1.9 0.7 - 4.0 K/uL   Monocytes Relative 6 %   Monocytes Absolute 0.6 0.1 - 1.0 K/uL   Eosinophils Relative 1 %   Eosinophils Absolute 0.1 0.0 - 0.5 K/uL   Basophils Relative 0 %   Basophils Absolute 0.0 0.0 - 0.1 K/uL   Immature Granulocytes 1 %   Abs Immature  Granulocytes 0.09 (H) 0.00 - 0.07 K/uL  Hemoglobin A1c     Status: Abnormal   Collection Time: 12/07/19  3:32 PM  Result Value Ref Range   Hgb A1c MFr Bld 7.5 (H) 4.8 - 5.6 %   Mean Plasma Glucose 168.55 mg/dL  Respiratory Panel by RT PCR (Flu A&B, Covid) - Nasopharyngeal Swab     Status: None   Collection Time: 12/07/19  4:53 PM   Specimen: Nasopharyngeal Swab  Result Value Ref Range   SARS Coronavirus 2 by RT PCR NEGATIVE NEGATIVE   Influenza A by PCR NEGATIVE NEGATIVE   Influenza B by PCR NEGATIVE NEGATIVE  Glucose, capillary     Status: Abnormal   Collection Time: 12/07/19 11:25 PM  Result Value Ref Range   Glucose-Capillary 286 (H) 70 - 99 mg/dL  Surgical pcr screen     Status: None   Collection Time: 12/08/19  2:22 AM   Specimen: Nasal Mucosa; Nasal Swab  Result Value Ref Range   MRSA, PCR NEGATIVE NEGATIVE   Staphylococcus aureus NEGATIVE NEGATIVE  Basic metabolic panel     Status: Abnormal   Collection Time: 12/08/19  3:02 AM  Result Value Ref Range   Sodium 135 135 - 145 mmol/L   Potassium 4.6 3.5 - 5.1 mmol/L   Chloride 98 98 - 111 mmol/L   CO2 26 22 - 32 mmol/L   Glucose, Bld 296 (H) 70 - 99 mg/dL   BUN 12 8 - 23 mg/dL   Creatinine, Ser 0.86 0.44 - 1.00 mg/dL   Calcium 8.9 8.9 - 57.8 mg/dL   GFR calc non Af Amer >60 >60 mL/min   GFR calc Af Amer >60 >60 mL/min   Anion gap 11 5 - 15  CBC     Status: Abnormal   Collection Time: 12/08/19  3:02 AM  Result Value Ref Range   WBC 10.9 (H) 4.0 - 10.5 K/uL   RBC 3.76 (L) 3.87 - 5.11 MIL/uL   Hemoglobin 10.6 (L) 12.0 - 15.0 g/dL   HCT 46.9 (L) 62.9 - 52.8 %   MCV 89.9 80.0 - 100.0 fL   MCH  28.2 26.0 - 34.0 pg   MCHC 31.4 30.0 - 36.0 g/dL   RDW 81.8 29.9 - 37.1 %   Platelets 229 150 - 400 K/uL   nRBC 0.0 0.0 - 0.2 %  Glucose, capillary     Status: Abnormal   Collection Time: 12/08/19  6:37 AM  Result Value Ref Range   Glucose-Capillary 260 (H) 70 - 99 mg/dL     X-ray: CLINICAL DATA:  Status post  fall.  EXAM: DG HIP (WITH OR WITHOUT PELVIS) 2-3V LEFT  COMPARISON:  None.  FINDINGS: Acute fracture deformity is seen extending through the inter trochanteric region of the proximal left femur. There is superior and lateral displacement of the distal fracture site with superior angulation of the fracture apex. There is no evidence of dislocation. Soft tissue structures are unremarkable.  IMPRESSION: 1. Acute inter trochanteric fracture of the proximal left femur.   Electronically Signed   By: Aram Candela M.D.  CLINICAL DATA:  Status post fall.  EXAM: RIGHT SHOULDER - 2+ VIEW  COMPARISON:  None.  FINDINGS: There is no evidence of fracture or dislocation. A single radiopaque surgical screw is seen overlying the right humeral head. Approximately 1.8 cm widening of the right acromioclavicular joint is noted. Soft tissues are unremarkable.  IMPRESSION: 1. Postoperative changes without evidence of an acute osseous abnormality.   Electronically Signed   By: Aram Candela M.D.  ROS  As per reviewed from HPI - new onset left hip pain and right shoulder pain  Pt denies any fevers, chills, nausea, vomiting, diarrhea, constipation, abdominal pain, shortness of breath, dyspnea on exertion, orthopnea, cough, wheezing, palpitations, headache, vision changes, lightheadedness, dizziness, melena, rectal bleeding.  Review of systems are otherwise negative   Blood pressure (!) 119/51, pulse 100, temperature 98.5 F (36.9 C), temperature source Oral, resp. rate 20, height 5\' 1"  (1.549 m), weight 93 kg, SpO2 100 %.  Physical Exam   General: Elderly female. Awake and alert and oriented x3. No acute cardiopulmonary distress.   HEENT: Normocephalic atraumatic.  Right and left ears normal in appearance.  Pupils equal, round, reactive to light. Extraocular muscles are intact. Sclerae anicteric and noninjected.  Moist mucosal membranes. No mucosal lesions.   Neck:  Neck supple without lymphadenopathy. No carotid bruits. No masses palpated.   Cardiovascular: Regular rate with normal S1-S2 sounds. No murmurs, rubs, gallops auscultated. No JVD.   Respiratory: Good respiratory effort with no wheezes, rales, rhonchi. Lungs clear to auscultation bilaterally.  No accessory muscle use.  Abdomen: Soft, nontender, nondistended. Active bowel sounds. No masses or hepatosplenomegaly   Skin: No rashes, lesions, or ulcerations.  Dry, warm to touch. 2+ dorsalis pedis and radial pulses.  Musculoskeletal: LLE shortened and externally rotated.  Pain in left hip.  Neurovascularly intact distal to the hip.  No other major joint problems no contractures   Normal BUE active and passive motion.  Some tenderness to palpation around right shoulder  Psychiatric: Intact judgment and insight. Pleasant and cooperative.  Neurologic: No focal neurological deficits. Strength is 5/5 and symmetric in upper and lower extremities.  Cranial nerves II through XII are grossly intact.      Assessment/Plan: 1. Complex, comminuted peri-trochanteric left hip fracture 2. Right shoulder contusion  Plan: I appreciate the admission to the Hospitalist service during these times due to her medical co-morbidities. She will need ORIF of this left hip Due to her chronic anticoagulation, ELiquis, which we will hold for now, I will plan to take her  to the OR Tuesday. Peri-op oral intake protocol as well as urgent OR orders to be placed today. Post op we will resume her Eliquis as well as determine functional status and limitations with therapy.  Conservative treatment for right shoulder contusion  [name] 12/08/2019, 6:52 AM

## 2019-12-08 NOTE — Progress Notes (Signed)
PROGRESS NOTE    Kelly Lee  WEX:[id number] DOB: 04-10-1951 DOA: 12/07/2019 PCP: Oval Linsey, PA-C     Brief Narrative:  Patient is a 69 year old female with a history of atrial fibrillation/flutter on Eliquis, hypertension, diabetes, and CHF.  She had a fall at home on 1/24 and was admitted for a left trochanteric fracture. Pain medicine started in ED.    New events last 24 hours / Subjective: Her last dose of Dilaudid was over 2 hours ago and she is in quite a bit of pain.  She is very hungry.  She is frustrated that surgery is tomorrow (1/26).  No chest pain or shortness of breath.  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  Continue pravastatin 40 mg daily  Continue Ranexa 500 mg twice daily    OAB  Continue Myrbetriq 50 mg daily    Morbid obesity with BMI of 40.0-44.9, adult (HCC)   PAF (paroxysmal atrial fibrillation) (HCC)  Continue metoprolol    Type 2 diabetes mellitus, with long-term current use of insulin (HCC)  Sliding scale insulin  Basal bolus insulin   Tradjenta 5 mg daily  Low-carb diet when able to eat  Dyslipidemia  Zetia 10 mg daily   DVT prophylaxis: SCDs when off of Eliquis Code Status: Full Family Communication: Self Disposition Plan: Home   Consultants:   Orthopedic surgery   Objective: Vitals:   12/07/19 1915 12/07/19 2017 12/08/19 0505 12/08/19 0857  BP:  (!) 147/71  (!) 119/51 (!) 122/51  Pulse: (!) 47 (!) 44 100 (!) 52  Resp: 18 19 20 17   Temp:  98.4 F (36.9 C) 98.5 F (36.9 C) 98.7 F (37.1 C)  TempSrc:  Oral Oral Oral  SpO2: 97% 91% 100% 97%  Weight:      Height:        Intake/Output Summary (Last 24 hours) at 12/08/2019 1056 Last data filed at 12/08/2019 0500 Gross per 24 hour  Intake --  Output 0 ml  Net 0 ml   Filed Weights   12/07/19 1504  Weight: 93 kg    Examination:  General exam: Appears calm and comfortable  Respiratory system: Clear to auscultation. Respiratory effort normal. No respiratory distress. No conversational dyspnea.  Cardiovascular system: S1 & S2 heard, Irreg heartbeat. No murmurs. No pedal edema. Gastrointestinal system: Abdomen is nondistended, soft and nontender. Normal bowel sounds heard. Central nervous system: Alert and oriented. No focal neurological deficits. Speech clear.  Skin: No rashes, lesions or ulcers on exposed skin  Psychiatry: Judgement and insight appear normal. Mood & affect appropriate.   Data Reviewed: I have personally reviewed following labs and imaging studies  CBC: Recent Labs  Lab 12/07/19 1532 12/08/19 0302  WBC 11.2* 10.9*  NEUTROABS 8.4*  --   HGB 11.6* 10.6*  HCT 36.2 33.8*  MCV 90.3 89.9  PLT 245 229   Basic Metabolic Panel: Recent Labs  Lab 12/07/19 1532  12/08/19 0302  NA 136 135  K 4.3 4.6  CL 103 98  CO2 27 26  GLUCOSE 180* 296*  BUN 14 12  CREATININE 0.73 0.92  CALCIUM 8.4* 8.9   GFR: Estimated Creatinine Clearance: 60 mL/min (by C-G formula based on SCr of 0.92 mg/dL). Liver Function Tests: Recent Labs  Lab 12/07/19 1532  AST 16  ALT 12  ALKPHOS 52  BILITOT 0.8  PROT 6.7  ALBUMIN 3.4*   HbA1C: Recent Labs    12/07/19 1532  HGBA1C 7.5*   CBG: Recent Labs  Lab 12/07/19 2325 12/08/19 0637  GLUCAP 286* 260*   Recent Results (from the past 240 hour(s))  Respiratory Panel by RT PCR (Flu A&B, Covid) - Nasopharyngeal Swab     Status: None    Collection Time: 12/07/19  4:53 PM   Specimen: Nasopharyngeal Swab  Result Value Ref Range Status   SARS Coronavirus 2 by RT PCR NEGATIVE NEGATIVE Final    Comment: (NOTE) SARS-CoV-2 target nucleic acids are NOT DETECTED. The SARS-CoV-2 RNA is generally detectable in upper respiratoy specimens during the acute phase of infection. The lowest concentration of SARS-CoV-2 viral copies this assay can detect is 131 copies/mL. A negative result does not preclude SARS-Cov-2 infection and should not be used as the sole basis for treatment or other patient management decisions. A negative result may occur with  improper specimen collection/handling, submission of specimen other than nasopharyngeal swab, presence of viral mutation(s) within the areas targeted by this assay, and inadequate number of viral copies (<131 copies/mL). A negative result must be combined with clinical observations, patient history, and epidemiological information. The expected result is Negative. Fact Sheet for Patients:  https://www.moore.com/ Fact Sheet for Healthcare Providers:  https://www.young.biz/ This test is not yet ap proved or cleared by the [place] FDA and  has been authorized for detection and/or diagnosis of SARS-CoV-2 by FDA under an Emergency Use Authorization (EUA). This EUA will remain  in effect (meaning this test can be used) for the duration of the COVID-19 declaration under Section 564(b)(1) of the Act, 21 U.S.C. section 360bbb-3(b)(1), unless the authorization is terminated or revoked sooner.    Influenza A by PCR NEGATIVE NEGATIVE Final   Influenza B by PCR NEGATIVE NEGATIVE Final    Comment: (NOTE) The Xpert Xpress SARS-CoV-2/FLU/RSV assay is intended as an aid in  the diagnosis of influenza from Nasopharyngeal swab specimens and  should not be used as a sole basis for treatment. Nasal washings and  aspirates are unacceptable for Xpert Xpress  SARS-CoV-2/FLU/RSV  testing. Fact Sheet for Patients: https://www.moore.com/ Fact Sheet for Healthcare Providers: https://www.young.biz/ This test is not yet approved or cleared by the [place] FDA and  has been authorized for detection and/or diagnosis of SARS-CoV-2 by  FDA under an Emergency Use Authorization (EUA). This EUA will remain  in effect (meaning this test can be used) for the duration of the  Covid-19 declaration under Section 564(b)(1) of the Act, 21  U.S.C. section 360bbb-3(b)(1), unless the authorization is  terminated or revoked. Performed at [facility], [address], [place], [place] [address]   Surgical pcr screen     Status: None   Collection Time: 12/08/19  2:22 AM   Specimen: Nasal Mucosa; Nasal Swab  Result Value Ref Range Status   MRSA, PCR NEGATIVE NEGATIVE Final   Staphylococcus aureus NEGATIVE NEGATIVE Final    Comment: (NOTE) The Xpert SA Assay (FDA approved for NASAL specimens in patients 22 years  of age and older), is one component of a comprehensive surveillance program. It is not intended to diagnose infection nor to guide or monitor treatment. Performed at [facility], [place] [address], [place],  [address]       Radiology Studies: DG Chest 1 View  Result Date: 12/07/2019 CLINICAL DATA:  Status post fall. EXAM: CHEST  1 VIEW COMPARISON:  February 15, 2018 FINDINGS: Multiple sternal wires and vascular clips are seen. Mild to moderate severity diffusely increased interstitial lung markings are noted. There is no evidence of focal consolidation, pleural effusion or pneumothorax. The cardiac silhouette is markedly enlarged. Multiple left-sided rib fractures of indeterminate age are seen. Multilevel degenerative changes seen throughout the thoracic spine. IMPRESSION: 1. Evidence of prior median sternotomy/CABG. 2. Chronic appearing increased interstitial lung markings, without evidence of acute  or active cardiopulmonary disease. 3. Multiple left-sided rib fractures of indeterminate age. These are not clearly identified on the prior study and may be acute in nature. Electronically Signed   By: Virgina Norfolk M.D.   On: 12/07/2019 16:48   DG Shoulder Right  Result Date: 12/07/2019 CLINICAL DATA:  Status post fall. EXAM: RIGHT SHOULDER - 2+ VIEW COMPARISON:  None. FINDINGS: There is no evidence of fracture or dislocation. A single radiopaque surgical screw is seen overlying the right humeral head. Approximately 1.8 cm widening of the right acromioclavicular joint is noted. Soft tissues are unremarkable. IMPRESSION: 1. Postoperative changes without evidence of an acute osseous abnormality. Electronically Signed   By: Virgina Norfolk M.D.   On: 12/07/2019 16:45   DG Hip Unilat With Pelvis 2-3 Views Left  Result Date: 12/07/2019 CLINICAL DATA:  Status post fall. EXAM: DG HIP (WITH OR WITHOUT PELVIS) 2-3V LEFT COMPARISON:  None. FINDINGS: Acute fracture deformity is seen extending through the inter trochanteric region of the proximal left femur. There is superior and lateral displacement of the distal fracture site with superior angulation of the fracture apex. There is no evidence of dislocation. Soft tissue structures are unremarkable. IMPRESSION: 1. Acute inter trochanteric fracture of the proximal left femur. Electronically Signed   By: Virgina Norfolk M.D.   On: 12/07/2019 16:44      Scheduled Meds: . ezetimibe  10 mg Oral Daily  . furosemide  20 mg Oral Daily  . insulin aspart  0-15 Units Subcutaneous TID WC  . insulin aspart  0-5 Units Subcutaneous QHS  . insulin glargine  33 Units Subcutaneous QHS  . linagliptin  5 mg Oral Daily  . metoprolol succinate  25 mg Oral Daily  . mirabegron ER  50 mg Oral Daily  . pantoprazole  40 mg Oral Daily  . pravastatin  40 mg Oral QHS  . ranolazine  500 mg Oral BID   Continuous Infusions:   LOS: 1 day    Time spent: 20 minutes    [name], DO [facility] 12/08/2019, 10:56 AM   Available via Epic secure chat 7am-7pm After these hours, please refer to coverage provider listed on [email or web address]

## 2019-12-08 NOTE — Plan of Care (Signed)
  Problem: Education: Goal: Knowledge of General Education information will improve Description: Including pain rating scale, medication(s)/side effects and non-pharmacologic comfort measures Outcome: Progressing   Problem: Clinical Measurements: Goal: Ability to maintain clinical measurements within normal limits will improve Outcome: Progressing   Problem: Activity: Goal: Risk for activity intolerance will decrease Outcome: Progressing   Problem: Coping: Goal: Level of anxiety will decrease Outcome: Progressing   Problem: Pain Managment: Goal: General experience of comfort will improve Outcome: Progressing   Problem: Skin Integrity: Goal: Risk for impaired skin integrity will decrease Outcome: Progressing   

## 2019-12-08 NOTE — Progress Notes (Signed)
Inpatient Diabetes Program Recommendations  AACE/ADA: New Consensus Statement on Inpatient Glycemic Control (2015)  Target Ranges:  Prepandial:   less than 140 mg/dL      Peak postprandial:   less than 180 mg/dL (1-2 hours)      Critically ill patients:  140 - 180 mg/dL   Lab Results  Component Value Date   GLUCAP 192 (H) 12/08/2019   HGBA1C 7.5 (H) 12/07/2019    Review of Glycemic Control Results for Kelly Lee, Kelly Lee (MRN [id number]) as of 12/08/2019 11:39  Ref. Range 12/07/2019 23:25 12/08/2019 06:37 12/08/2019 11:22  Glucose-Capillary Latest Ref Range: 70 - 99 mg/dL 813 (H) 887 (H) 195 (H)   Diabetes history: Type 2 DM Outpatient Diabetes medications: Januvia 100 mg QD, Metformin 500 mg TID, Lantus 33 units QHS Current orders for Inpatient glycemic control: Lantus 33 units QHS, Tradjenta 5 mg QD, Novolog 0-15 units TID, Novolog 0-5 units QHS  Inpatient Diabetes Program Recommendations:    Consider increasing Lantus to 36 units QHS.   Thanks, Lujean Rave, MSN, RNC-OB Diabetes Coordinator [phone] (8a-5p)

## 2019-12-09 ENCOUNTER — Inpatient Hospital Stay (HOSPITAL_COMMUNITY): Payer: Medicare PPO

## 2019-12-09 ENCOUNTER — Encounter (HOSPITAL_COMMUNITY): Payer: Self-pay

## 2019-12-09 ENCOUNTER — Encounter (HOSPITAL_COMMUNITY)
Admission: EM | Disposition: A | Discharge status: Skilled Nursing Facility | Payer: Self-pay | Source: Home / Self Care | Attending: Family Medicine

## 2019-12-09 HISTORY — PX: FEMUR IM NAIL: SHX1597

## 2019-12-09 LAB — CBC
HCT: 28.3 % — ABNORMAL LOW (ref 36.0–46.0)
Hemoglobin: 9 g/dL — ABNORMAL LOW (ref 12.0–15.0)
MCH: 28.8 pg (ref 26.0–34.0)
MCHC: 31.8 g/dL (ref 30.0–36.0)
MCV: 90.7 fL (ref 80.0–100.0)
Platelets: 183 10*3/uL (ref 150–400)
RBC: 3.12 MIL/uL — ABNORMAL LOW (ref 3.87–5.11)
RDW: 13.6 % (ref 11.5–15.5)
WBC: 10.6 10*3/uL — ABNORMAL HIGH (ref 4.0–10.5)
nRBC: 0 % (ref 0.0–0.2)

## 2019-12-09 LAB — GLUCOSE, CAPILLARY
Glucose-Capillary: 161 mg/dL — ABNORMAL HIGH (ref 70–99)
Glucose-Capillary: 172 mg/dL — ABNORMAL HIGH (ref 70–99)
Glucose-Capillary: 180 mg/dL — ABNORMAL HIGH (ref 70–99)
Glucose-Capillary: 240 mg/dL — ABNORMAL HIGH (ref 70–99)
Glucose-Capillary: 306 mg/dL — ABNORMAL HIGH (ref 70–99)

## 2019-12-09 LAB — BASIC METABOLIC PANEL
Anion gap: 7 (ref 5–15)
BUN: 16 mg/dL (ref 8–23)
CO2: 29 mmol/L (ref 22–32)
Calcium: 8.7 mg/dL — ABNORMAL LOW (ref 8.9–10.3)
Chloride: 99 mmol/L (ref 98–111)
Creatinine, Ser: 0.82 mg/dL (ref 0.44–1.00)
GFR calc Af Amer: >60 mL/min (ref >60)
GFR calc non Af Amer: >60 mL/min (ref >60)
Glucose, Bld: 169 mg/dL — ABNORMAL HIGH (ref 70–99)
Potassium: 4.5 mmol/L (ref 3.5–5.1)
Sodium: 135 mmol/L (ref 135–145)

## 2019-12-09 SURGERY — INSERTION, INTRAMEDULLARY ROD, FEMUR
Anesthesia: General | Laterality: Left

## 2019-12-09 MED ORDER — VANCOMYCIN HCL 1000 MG IV SOLR
INTRAVENOUS | Status: DC | PRN
  Administered 2019-12-09: 13:00:00 1000 mg via INTRAVENOUS

## 2019-12-09 MED ORDER — ACETAMINOPHEN 325 MG PO TABS
325.0000 mg | ORAL_TABLET | Freq: Four times a day (QID) | ORAL | Status: DC | PRN
  Administered 2019-12-10: 23:00:00 650 mg via ORAL
  Filled 2019-12-09: qty 2

## 2019-12-09 MED ORDER — PHENOL 1.4 % MT LIQD: 1.0000 | OROMUCOSAL | Status: DC | PRN

## 2019-12-09 MED ORDER — 0.9 % SODIUM CHLORIDE (POUR BTL) OPTIME
TOPICAL | Status: DC | PRN
  Administered 2019-12-09: 1000 mL

## 2019-12-09 MED ORDER — FENTANYL CITRATE (PF) 100 MCG/2ML IJ SOLN
INTRAMUSCULAR | Status: DC | PRN
  Administered 2019-12-09: 50 ug via INTRAVENOUS

## 2019-12-09 MED ORDER — PROPOFOL 1000 MG/100ML IV EMUL
INTRAVENOUS | Status: AC
  Filled 2019-12-09: qty 100

## 2019-12-09 MED ORDER — ONDANSETRON HCL 4 MG/2ML IJ SOLN: 4.0000 mg | Freq: Four times a day (QID) | INTRAMUSCULAR | Status: DC | PRN

## 2019-12-09 MED ORDER — HYDROCODONE-ACETAMINOPHEN 5-325 MG PO TABS
1.0000 | ORAL_TABLET | ORAL | Status: DC | PRN
  Administered 2019-12-10 – 2019-12-17 (×12): 2 via ORAL
  Filled 2019-12-09 (×13): qty 2

## 2019-12-09 MED ORDER — MORPHINE SULFATE (PF) 2 MG/ML IV SOLN: 0.5000 mg | INTRAVENOUS | Status: DC | PRN

## 2019-12-09 MED ORDER — DEXAMETHASONE SODIUM PHOSPHATE 10 MG/ML IJ SOLN
INTRAMUSCULAR | Status: DC | PRN
  Administered 2019-12-09: 5 mg via INTRAVENOUS

## 2019-12-09 MED ORDER — HYDROCODONE-ACETAMINOPHEN 7.5-325 MG PO TABS
1.0000 | ORAL_TABLET | ORAL | Status: DC | PRN
  Administered 2019-12-12: 1 via ORAL
  Administered 2019-12-12: 22:00:00 2 via ORAL
  Administered 2019-12-12 (×2): 1 via ORAL
  Filled 2019-12-09: qty 2
  Filled 2019-12-09 (×3): qty 1

## 2019-12-09 MED ORDER — SUGAMMADEX SODIUM 200 MG/2ML IV SOLN
INTRAVENOUS | Status: DC | PRN
  Administered 2019-12-09: 200 mg via INTRAVENOUS

## 2019-12-09 MED ORDER — FENTANYL CITRATE (PF) 250 MCG/5ML IJ SOLN
INTRAMUSCULAR | Status: AC
  Filled 2019-12-09: qty 5

## 2019-12-09 MED ORDER — LIDOCAINE 2% (20 MG/ML) 5 ML SYRINGE
INTRAMUSCULAR | Status: AC
  Filled 2019-12-09: qty 5

## 2019-12-09 MED ORDER — ACETAMINOPHEN 500 MG PO TABS
1000.0000 mg | ORAL_TABLET | Freq: Once | ORAL | Status: AC
  Administered 2019-12-09: 11:00:00 1000 mg via ORAL
  Filled 2019-12-09: qty 2

## 2019-12-09 MED ORDER — ALBUMIN HUMAN 5 % IV SOLN: INTRAVENOUS | Status: DC | PRN

## 2019-12-09 MED ORDER — DOCUSATE SODIUM 100 MG PO CAPS
100.0000 mg | ORAL_CAPSULE | Freq: Two times a day (BID) | ORAL | Status: DC
  Administered 2019-12-09 – 2019-12-16 (×14): 100 mg via ORAL
  Filled 2019-12-09 (×15): qty 1

## 2019-12-09 MED ORDER — VANCOMYCIN HCL IN DEXTROSE 1-5 GM/200ML-% IV SOLN
1000.0000 mg | Freq: Two times a day (BID) | INTRAVENOUS | Status: AC
  Administered 2019-12-09: 1000 mg via INTRAVENOUS
  Filled 2019-12-09 (×2): qty 200

## 2019-12-09 MED ORDER — PHENYLEPHRINE 40 MCG/ML (10ML) SYRINGE FOR IV PUSH (FOR BLOOD PRESSURE SUPPORT)
PREFILLED_SYRINGE | INTRAVENOUS | Status: DC | PRN
  Administered 2019-12-09 (×3): 120 ug via INTRAVENOUS
  Administered 2019-12-09: 80 ug via INTRAVENOUS
  Administered 2019-12-09: 40 ug via INTRAVENOUS
  Administered 2019-12-09 (×2): 120 ug via INTRAVENOUS

## 2019-12-09 MED ORDER — ONDANSETRON HCL 4 MG/2ML IJ SOLN
INTRAMUSCULAR | Status: DC | PRN
  Administered 2019-12-09: 4 mg via INTRAVENOUS

## 2019-12-09 MED ORDER — METOCLOPRAMIDE HCL 5 MG/ML IJ SOLN: 5.0000 mg | Freq: Three times a day (TID) | INTRAMUSCULAR | Status: DC | PRN

## 2019-12-09 MED ORDER — MIDAZOLAM HCL 2 MG/2ML IJ SOLN
INTRAMUSCULAR | Status: AC
  Filled 2019-12-09: qty 2

## 2019-12-09 MED ORDER — ROCURONIUM BROMIDE 10 MG/ML (PF) SYRINGE
PREFILLED_SYRINGE | INTRAVENOUS | Status: AC
  Filled 2019-12-09: qty 10

## 2019-12-09 MED ORDER — ROCURONIUM BROMIDE 10 MG/ML (PF) SYRINGE
PREFILLED_SYRINGE | INTRAVENOUS | Status: DC | PRN
  Administered 2019-12-09: 50 mg via INTRAVENOUS

## 2019-12-09 MED ORDER — MENTHOL 3 MG MT LOZG: 1.0000 | LOZENGE | OROMUCOSAL | Status: DC | PRN

## 2019-12-09 MED ORDER — LACTATED RINGERS IV SOLN: INTRAVENOUS | Status: DC

## 2019-12-09 MED ORDER — LACTATED RINGERS IV SOLN: INTRAVENOUS | Status: DC | PRN

## 2019-12-09 MED ORDER — FENTANYL CITRATE (PF) 100 MCG/2ML IJ SOLN: 25.0000 ug | INTRAMUSCULAR | Status: DC | PRN

## 2019-12-09 MED ORDER — ACETAMINOPHEN 500 MG PO TABS
500.0000 mg | ORAL_TABLET | Freq: Four times a day (QID) | ORAL | Status: AC
  Administered 2019-12-09 – 2019-12-10 (×3): 500 mg via ORAL
  Filled 2019-12-09 (×2): qty 1

## 2019-12-09 MED ORDER — ONDANSETRON HCL 4 MG/2ML IJ SOLN: 4.0000 mg | Freq: Once | INTRAMUSCULAR | Status: DC | PRN

## 2019-12-09 MED ORDER — SODIUM CHLORIDE 0.9 % IV SOLN
INTRAVENOUS | Status: DC | PRN
  Administered 2019-12-09: 35 ug/min via INTRAVENOUS

## 2019-12-09 MED ORDER — ONDANSETRON HCL 4 MG PO TABS: 4.0000 mg | ORAL_TABLET | Freq: Four times a day (QID) | ORAL | Status: DC | PRN

## 2019-12-09 MED ORDER — PROPOFOL 10 MG/ML IV BOLUS
INTRAVENOUS | Status: DC | PRN
  Administered 2019-12-09: 100 mg via INTRAVENOUS

## 2019-12-09 MED ORDER — FERROUS SULFATE 325 (65 FE) MG PO TABS
325.0000 mg | ORAL_TABLET | Freq: Three times a day (TID) | ORAL | Status: DC
  Administered 2019-12-09 – 2019-12-17 (×24): 325 mg via ORAL
  Filled 2019-12-09 (×24): qty 1

## 2019-12-09 MED ORDER — LIDOCAINE 2% (20 MG/ML) 5 ML SYRINGE
INTRAMUSCULAR | Status: DC | PRN
  Administered 2019-12-09: 80 mg via INTRAVENOUS

## 2019-12-09 MED ORDER — SUCCINYLCHOLINE CHLORIDE 200 MG/10ML IV SOSY
PREFILLED_SYRINGE | INTRAVENOUS | Status: AC
  Filled 2019-12-09: qty 10

## 2019-12-09 MED ORDER — PHENYLEPHRINE 40 MCG/ML (10ML) SYRINGE FOR IV PUSH (FOR BLOOD PRESSURE SUPPORT)
PREFILLED_SYRINGE | INTRAVENOUS | Status: AC
  Filled 2019-12-09: qty 10

## 2019-12-09 MED ORDER — ONDANSETRON HCL 4 MG/2ML IJ SOLN
INTRAMUSCULAR | Status: AC
  Filled 2019-12-09: qty 2

## 2019-12-09 MED ORDER — METOCLOPRAMIDE HCL 5 MG PO TABS: 5.0000 mg | ORAL_TABLET | Freq: Three times a day (TID) | ORAL | Status: DC | PRN

## 2019-12-09 MED ORDER — POLYETHYLENE GLYCOL 3350 17 G PO PACK: 17.0000 g | PACK | Freq: Every day | ORAL | Status: DC | PRN

## 2019-12-09 MED ORDER — APIXABAN 2.5 MG PO TABS
2.5000 mg | ORAL_TABLET | Freq: Two times a day (BID) | ORAL | Status: DC
  Administered 2019-12-10 – 2019-12-11 (×3): 2.5 mg via ORAL
  Filled 2019-12-09 (×3): qty 1

## 2019-12-09 MED ORDER — MIDAZOLAM HCL 2 MG/2ML IJ SOLN
INTRAMUSCULAR | Status: DC | PRN
  Administered 2019-12-09: 1 mg via INTRAVENOUS

## 2019-12-09 SURGICAL SUPPLY — 56 items
BIT DRILL 4.3MMS DISTAL GRDTED (BIT) IMPLANT
COVER MAYO STAND STRL (DRAPES) ×2 IMPLANT
COVER PERINEAL POST (MISCELLANEOUS) ×3 IMPLANT
COVER WAND RF STERILE (DRAPES) ×3 IMPLANT
DERMABOND ADHESIVE PROPEN (GAUZE/BANDAGES/DRESSINGS) ×2
DERMABOND ADVANCED (GAUZE/BANDAGES/DRESSINGS) ×2
DERMABOND ADVANCED .7 DNX12 (GAUZE/BANDAGES/DRESSINGS) IMPLANT
DERMABOND ADVANCED .7 DNX6 (GAUZE/BANDAGES/DRESSINGS) IMPLANT
DRAPE INCISE IOBAN 66X45 STRL (DRAPES) ×2 IMPLANT
DRAPE STERI IOBAN 125X83 (DRAPES) ×3 IMPLANT
DRAPE SURG 17X23 STRL (DRAPES) ×3 IMPLANT
DRESSING AQUACEL AG SP 3.5X4 (GAUZE/BANDAGES/DRESSINGS) IMPLANT
DRILL 4.3MMS DISTAL GRADUATED (BIT) ×3
DRSG ADAPTIC 3X8 NADH LF (GAUZE/BANDAGES/DRESSINGS) ×3 IMPLANT
DRSG AQUACEL AG ADV 3.5X 6 (GAUZE/BANDAGES/DRESSINGS) ×2 IMPLANT
DRSG AQUACEL AG SP 3.5X4 (GAUZE/BANDAGES/DRESSINGS) ×3
DRSG EMULSION OIL 3X3 NADH (GAUZE/BANDAGES/DRESSINGS) ×2 IMPLANT
DRSG MEPILEX BORDER 4X12 (GAUZE/BANDAGES/DRESSINGS) IMPLANT
DRSG MEPILEX BORDER 4X4 (GAUZE/BANDAGES/DRESSINGS) ×4 IMPLANT
DRSG MEPILEX BORDER 4X8 (GAUZE/BANDAGES/DRESSINGS) ×2 IMPLANT
DURAPREP 26ML APPLICATOR (WOUND CARE) ×3 IMPLANT
ELECT REM PT RETURN 9FT ADLT (ELECTROSURGICAL) ×3
ELECTRODE REM PT RTRN 9FT ADLT (ELECTROSURGICAL) ×1 IMPLANT
EVACUATOR 1/8 PVC DRAIN (DRAIN) IMPLANT
GAUZE SPONGE 4X4 12PLY STRL (GAUZE/BANDAGES/DRESSINGS) ×3 IMPLANT
GLOVE BIOGEL PI IND STRL 7.5 (GLOVE) ×1 IMPLANT
GLOVE BIOGEL PI IND STRL 8 (GLOVE) ×1 IMPLANT
GLOVE BIOGEL PI INDICATOR 7.5 (GLOVE) ×2
GLOVE BIOGEL PI INDICATOR 8 (GLOVE) ×2
GLOVE ORTHO TXT STRL SZ7.5 (GLOVE) ×3 IMPLANT
GLOVE SURG ORTHO 8.0 STRL STRW (GLOVE) ×3 IMPLANT
GOWN STRL REUS W/ TWL LRG LVL3 (GOWN DISPOSABLE) ×3 IMPLANT
GOWN STRL REUS W/TWL LRG LVL3 (GOWN DISPOSABLE) ×6
GUIDEPIN 3.2X17.5 THRD DISP (PIN) ×2 IMPLANT
GUIDEWIRE BALL NOSE 80CM (WIRE) ×2 IMPLANT
HFN LAG SCREW 10.5MM X 115MM (Orthopedic Implant) ×2 IMPLANT
HFN LH 130 DEG 11MM X 340MM (Nail) ×2 IMPLANT
KIT BASIN OR (CUSTOM PROCEDURE TRAY) ×3 IMPLANT
KIT TURNOVER KIT B (KITS) ×3 IMPLANT
MANIFOLD NEPTUNE II (INSTRUMENTS) ×3 IMPLANT
NS IRRIG 1000ML POUR BTL (IV SOLUTION) ×3 IMPLANT
PACK GENERAL/GYN (CUSTOM PROCEDURE TRAY) ×3 IMPLANT
PAD ARMBOARD 7.5X6 YLW CONV (MISCELLANEOUS) ×6 IMPLANT
SCREW BONE CORTICAL 5.0X36 (Screw) ×2 IMPLANT
SCREWDRIVER HEX TIP 3.5MM (MISCELLANEOUS) ×2 IMPLANT
STAPLER VISISTAT 35W (STAPLE) ×3 IMPLANT
SUT MNCRL AB 4-0 PS2 18 (SUTURE) ×4 IMPLANT
SUT VIC AB 0 CT1 27 (SUTURE) ×4
SUT VIC AB 0 CT1 27XBRD ANBCTR (SUTURE) ×2 IMPLANT
SUT VIC AB 1 CT1 27 (SUTURE) ×2
SUT VIC AB 1 CT1 27XBRD ANBCTR (SUTURE) ×1 IMPLANT
SUT VIC AB 2-0 CT1 27 (SUTURE) ×4
SUT VIC AB 2-0 CT1 TAPERPNT 27 (SUTURE) ×1 IMPLANT
TOWEL GREEN STERILE (TOWEL DISPOSABLE) ×3 IMPLANT
TOWEL GREEN STERILE FF (TOWEL DISPOSABLE) ×3 IMPLANT
WATER STERILE IRR 1000ML POUR (IV SOLUTION) ×3 IMPLANT

## 2019-12-09 NOTE — Anesthesia Preprocedure Evaluation (Addendum)
Anesthesia Evaluation  Patient identified by MRN, date of birth, ID band Patient awake    Reviewed: Allergy & Precautions, NPO status , Patient's Chart, lab work & pertinent test results, reviewed documented beta blocker date and time   History of Anesthesia Complications Negative for: history of anesthetic complications  Airway Mallampati: II  TM Distance: >3 FB Neck ROM: Full    Dental  (+) Teeth Intact, Dental Advisory Given   Pulmonary asthma ,  2L O2 [place]   Pulmonary exam normal breath sounds clear to auscultation       Cardiovascular hypertension, Pt. on home beta blockers and Pt. on medications (-) angina+ CAD, + Past MI, + Cardiac Stents and + CABG (2000)  Normal cardiovascular exam+ dysrhythmias Atrial Fibrillation  Rhythm:Regular Rate:Normal     Neuro/Psych negative neurological ROS  negative psych ROS   GI/Hepatic Neg liver ROS, GERD  Medicated,  Endo/Other  diabetes, Type 2, Oral Hypoglycemic Agents, Insulin DependentHypothyroidism Obesity   Renal/GU negative Renal ROS     Musculoskeletal negative musculoskeletal ROS (+)   Abdominal   Peds  Hematology  (+) Blood dyscrasia (Eliquis), anemia ,   Anesthesia Other Findings Day of surgery medications reviewed with the patient.  Reproductive/Obstetrics                            Anesthesia Physical Anesthesia Plan  ASA: III  Anesthesia Plan: General   Post-op Pain Management:    Induction: Intravenous  PONV Risk Score and Plan: 3 and Midazolam, Dexamethasone and Ondansetron  Airway Management Planned: Oral ETT  Additional Equipment:   Intra-op Plan:   Post-operative Plan: Extubation in OR  Informed Consent: I have reviewed the patients History and Physical, chart, labs and discussed the procedure including the risks, benefits and alternatives for the proposed anesthesia with the patient or authorized representative who  has indicated his/her understanding and acceptance.     Dental advisory given  Plan Discussed with: CRNA  Anesthesia Plan Comments:         Anesthesia Quick Evaluation

## 2019-12-09 NOTE — Anesthesia Procedure Notes (Addendum)
Procedure Name: Intubation Date/Time: 12/09/2019 12:15 PM Performed by: Jodell Cipro, CRNA Pre-anesthesia Checklist: Patient identified, Emergency Drugs available, Suction available and Patient being monitored Patient Re-evaluated:Patient Re-evaluated prior to induction Oxygen Delivery Method: Circle system utilized Preoxygenation: Pre-oxygenation with 100% oxygen Induction Type: IV induction Ventilation: Mask ventilation without difficulty Laryngoscope Size: Glidescope and 3 Grade View: Grade I Tube type: Oral Tube size: 7.0 mm Number of attempts: 1 Airway Equipment and Method: Stylet and Video-laryngoscopy Placement Confirmation: ETT inserted through vocal cords under direct vision,  positive ETCO2 and breath sounds checked- equal and bilateral Secured at: 21 cm Tube secured with: Tape Dental Injury: Teeth and Oropharynx as per pre-operative assessment  Comments: Edison Pace SRNA intubated under CRNA supervision. Glidescope elected for educational purposes

## 2019-12-09 NOTE — Op Note (Signed)
Kelly Lee, Kelly Lee MEDICAL RECORD [id number] ACCOUNT [id number] DATE OF BIRTH:[birth year] FACILITY: [facility] LOCATION: [facility] PHYSICIAN:Crytal Pensinger Rosalia Hammers, MD  OPERATIVE REPORT  DATE OF PROCEDURE:  12/09/2019  PREOPERATIVE DIAGNOSIS:  Comminuted complex peritrochanteric left hip fracture.  POSTOPERATIVE DIAGNOSIS:  Comminuted complex peritrochanteric left hip fracture.  PROCEDURE:  Open reduction internal fixation, left intertrochanteric femur fracture utilizing a Biomet/Zimmer 320 x11 mm trochanteric entry Affixus nail with a 115 mm lag screw locked and a distal interlock.  SURGEON:  Durene Romans, MD  ASSISTANT:  Dennie Bible, PA-C.  Note that Ms. Adrian Blackwater was present for the entirety of the case from preoperative positioning, perioperative management of the extremity, assistance with the reduction of the fracture site, primary wound closure and general facilitation of the case.  ANESTHESIA:  General.  SPECIMENS:  None.  COMPLICATIONS:  None.  BLOOD LOSS:  Probably about 200 mL.  DRAINS:  None.  INDICATIONS:  The patient is a 69 year old female, oxygen dependent.  She unfortunately had a ground level fall at home when she tripped over her oxygen line or her dog and fell onto her left side.  She was brought to the emergency room due to inability  to bear weight.  Radiographs in the emergency room revealed a complex peritrochanteric femur fracture.  She was admitted to the medical service for optimization, as well as to allow her to be off of her anticoagulation for a couple days.  We reviewed the  risks of surgery including infection, DVT as being fairly minimal, but the biggest risks was for bony union, malunion and need for future surgeries.  Consent was obtained for fracture management.  PROCEDURE IN DETAIL:  The patient was brought to the operative theater.  Once adequate anesthesia, preoperative antibiotics, vancomycin administered due to PENICILLIN and CEPHALOSPORIN  ALLERGY reported, she was positioned supine on the fracture table.   Her right unaffected extremity was positioned into the well leg holder with all bony prominences padded, particularly over the lateral aspect of the knee.  Traction was applied to her lower extremity and fluoroscopy was used to identify the fracture.  In  the lateral view, her fracture looks fairly good, other than an anterior based flexion at the fracture site.  The comminution of the peritrochanteric region was appreciated.  The left hip was then prepped and draped in sterile fashion.  Timeout was  performed, identifying the patient, the planned procedure and extremity.  The fluoroscopy was brought back to the field.  The tip of the trochanter was identified and a lateral incision was made proximal to this.  Soft tissue dissection was carried  through the gluteal fascia.  At this point, I was able to place a guidewire in the tip of the trochanter and passed it through the fracture site into the proximal femoral shaft.  I then opened up the proximal femur with a drill and passed the ball-tipped  guidewire.  We measured the depth and selected a 340 mm nail.  I then did pass a single 12.5 mm reamer, which passed without difficulty.  We selected an 11 mm nail.  The 11 x 340 mm left Affixus nail was then opened and placed onto the jig.  It was then  passed by hand.  At this point, I evaluated the fracture site.  We opened up laterally where the lag screw would be inserted.  I then used a combination of a bone hook to help lateralize the shaft to the fracture site, as well as placed a Cobb  elevator  over the anterior aspect of the neck to reduce the apex angulation.  Once I felt that the fracture was in near anatomic position,  we let some the traction off to allow it to settle into position.  We then passed a guidewire under fluoroscopic imaging  into the femoral head.  In the lateral view, this was in center of the head in the AP view and in  the best position I could.  After multiple attempts, it was slightly proximal to the center of the head.  Given these findings, as well as the nature of her  fracture, I accepted this.  We measured and selected 115 mm lag screw.  This was then drilled and the lag screw positioned into the subchondral bone.  I then used the compression wheel with traction off and medialized the shaft to the fracture segment   a bit more.  The lag screw was outside the lateral cortex as intended to provide stability.  Once confirmed again in AP and lateral planes, I tightened down the lag screw to make this a locking device and not allow for further compression based on the  comminuted nature of this fracture.  Once this was done, the jig was removed.  At this point, we took traction off and abducted her leg.  We utilized the perfect circle technique and placed the distal interlock into the static position.  This was again  confirmed radiographically in both planes.  Once this was confirmed, the case was concluded.  We irrigated the wounds with normal saline solution.  The proximal wound was closed in layers with the gluteal fascia with #1 Vicryl, the iliotibial band on the  lateral incision.  The remainder of the wounds were closed with 2-0 Vicryl and Monocryl stitches.  The wounds were then cleaned, dried and dressed sterilely using surgical glue and Aquacel dressing.  The patient was then brought to the recovery room in  stable condition, tolerating the procedure well.  Findings were reviewed with her daughter.  Postoperatively, she will need to be partial weightbearing due to the complicated nature of this peritrochanteric fracture.  This will be likely for 6-12 weeks.  Physical therapy will initiate assessment and determine need to discharge.  VN/NUANCE  D:12/09/2019 T:12/09/2019 JOB:[id number]

## 2019-12-09 NOTE — Progress Notes (Signed)
PROGRESS NOTE    Kelly Lee  OZD:[id number] DOB: [birth year] DOA: 12/07/2019 PCP: Oval Linsey, PA-C     Brief Narrative:  Patient is a 69 year old female with a history of atrial fibrillation/flutter on Eliquis, hypertension, diabetes, and CHF.  She had a fall at home on 1/24 and was admitted for a left trochanteric fracture. Pain medicine started in ED.    New events last 24 hours / Subjective: OR today with ortho.  Pain is currently controlled.  No complaints.  Ready to have surgery.  Assessment & Plan:   Principal Problem:   Closed intertrochanteric fracture, left, initial encounter [facility]) Active Problems:   Hypertension   Anticoagulant long-term use   Chronic respiratory failure with hypoxia (HCC)   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   Dyslipidemia   Hypothyroidism   Morbid obesity with BMI of 40.0-44.9, adult (HCC)   PAF (paroxysmal atrial fibrillation) (HCC)   Type 2 diabetes mellitus, with long-term current use of insulin (HCC)   DVT prophylaxis: SCDs, Eliquis once ortho deems safe to restart Code Status: Full Family Communication: Self Disposition Plan: Likely rehab depending on how she does post-op   Consultants:   Ortho  Procedures:   ORIF today  Antimicrobials:  Anti-infectives (From admission, onward)   None        Objective: Vitals:   12/08/19 2001 12/09/19 0349 12/09/19 0727 12/09/19 1117  BP: 116/64 134/70 (!) 120/56   Pulse: 97 97 97   Resp: 19 16 18    Temp: 98.8 F (37.1 C) 98.7 F (37.1 C) 98.7 F (37.1 C)   TempSrc: Oral Oral Oral   SpO2: 99% 96% 97%   Weight:    93 kg  Height:    5\' 1"  (1.549 m)    Intake/Output Summary (Last 24 hours) at 12/09/2019 1208 Last data filed at 12/09/2019 0500 Gross per 24 hour  Intake -  Output 1250 ml  Net -1250 ml   Filed Weights   12/07/19 1504 12/09/19 1117  Weight: 93 kg 93 kg    Examination:  General exam:  Appears calm and comfortable  Respiratory system: Clear to auscultation. Respiratory effort normal. No respiratory distress. No conversational dyspnea.  Cardiovascular system: S1 & S2 heard, RRR. No pedal edema. Gastrointestinal system: Abdomen is nondistended, soft and nontender. Normal bowel sounds heard. Central nervous system: Alert and oriented. No focal neurological deficits. Speech clear.  Skin: No rashes, lesions or ulcers on exposed skin  Psychiatry: Judgement and insight appear normal. Mood & affect appropriate.   Data Reviewed: I have personally reviewed following labs and imaging studies  CBC: Recent Labs  Lab 12/07/19 1532 12/08/19 0302 12/09/19 0255  WBC 11.2* 10.9* 10.6*  NEUTROABS 8.4*  --   --   HGB 11.6* 10.6* 9.0*  HCT 36.2 33.8* 28.3*  MCV 90.3 89.9 90.7  PLT 245 229 183   Basic Metabolic Panel: Recent Labs  Lab 12/07/19 1532 12/08/19 0302 12/09/19 0255  NA 136 135 135  K 4.3 4.6 4.5  CL 103 98 99  CO2 27 26 29   GLUCOSE 180* 296* 169*  BUN 14 12 16   CREATININE 0.73 0.92 0.82  CALCIUM 8.4* 8.9 8.7*   GFR: Estimated Creatinine Clearance: 67.4 mL/min (by C-G formula based on SCr of 0.82 mg/dL). Liver Function Tests: Recent Labs  Lab 12/07/19 1532  AST 16  ALT 12  ALKPHOS 52  BILITOT 0.8  PROT 6.7  ALBUMIN 3.4*   HbA1C: Recent Labs  12/07/19 1532  HGBA1C 7.5*   CBG: Recent Labs  Lab 12/08/19 1122 12/08/19 1612 12/08/19 2055 12/09/19 0632 12/09/19 1115  GLUCAP 192* 176* 158* 161* 172*   Recent Results (from the past 240 hour(s))  Respiratory Panel by RT PCR (Flu A&B, Covid) - Nasopharyngeal Swab     Status: None   Collection Time: 12/07/19  4:53 PM   Specimen: Nasopharyngeal Swab  Result Value Ref Range Status   SARS Coronavirus 2 by RT PCR NEGATIVE NEGATIVE Final    Comment: (NOTE) SARS-CoV-2 target nucleic acids are NOT DETECTED. The SARS-CoV-2 RNA is generally detectable in upper respiratoy specimens during the acute  phase of infection. The lowest concentration of SARS-CoV-2 viral copies this assay can detect is 131 copies/mL. A negative result does not preclude SARS-Cov-2 infection and should not be used as the sole basis for treatment or other patient management decisions. A negative result may occur with  improper specimen collection/handling, submission of specimen other than nasopharyngeal swab, presence of viral mutation(s) within the areas targeted by this assay, and inadequate number of viral copies (<131 copies/mL). A negative result must be combined with clinical observations, patient history, and epidemiological information. The expected result is Negative. Fact Sheet for Patients:  PinkCheek.be Fact Sheet for Healthcare Providers:  GravelBags.it This test is not yet ap proved or cleared by the [place] FDA and  has been authorized for detection and/or diagnosis of SARS-CoV-2 by FDA under an Emergency Use Authorization (EUA). This EUA will remain  in effect (meaning this test can be used) for the duration of the COVID-19 declaration under Section 564(b)(1) of the Act, 21 U.S.C. section 360bbb-3(b)(1), unless the authorization is terminated or revoked sooner.    Influenza A by PCR NEGATIVE NEGATIVE Final   Influenza B by PCR NEGATIVE NEGATIVE Final    Comment: (NOTE) The Xpert Xpress SARS-CoV-2/FLU/RSV assay is intended as an aid in  the diagnosis of influenza from Nasopharyngeal swab specimens and  should not be used as a sole basis for treatment. Nasal washings and  aspirates are unacceptable for Xpert Xpress SARS-CoV-2/FLU/RSV  testing. Fact Sheet for Patients: PinkCheek.be Fact Sheet for Healthcare Providers: GravelBags.it This test is not yet approved or cleared by the [place] FDA and  has been authorized for detection and/or diagnosis of SARS-CoV-2 by   FDA under an Emergency Use Authorization (EUA). This EUA will remain  in effect (meaning this test can be used) for the duration of the  Covid-19 declaration under Section 564(b)(1) of the Act, 21  U.S.C. section 360bbb-3(b)(1), unless the authorization is  terminated or revoked. Performed at [facility], [place] [address], [place], [place] [address]   Surgical pcr screen     Status: None   Collection Time: 12/08/19  2:22 AM   Specimen: Nasal Mucosa; Nasal Swab  Result Value Ref Range Status   MRSA, PCR NEGATIVE NEGATIVE Final   Staphylococcus aureus NEGATIVE NEGATIVE Final    Comment: (NOTE) The Xpert SA Assay (FDA approved for NASAL specimens in patients 72 years of age and older), is one component of a comprehensive surveillance program. It is not intended to diagnose infection nor to guide or monitor treatment. Performed at [facility], [place] [address], [place], [place] [address]       Radiology Studies: DG Chest 1 View  Result Date: 12/07/2019 CLINICAL DATA:  Status post fall. EXAM: CHEST  1 VIEW COMPARISON:  February 15, 2018 FINDINGS: Multiple sternal wires and vascular clips  are seen. Mild to moderate severity diffusely increased interstitial lung markings are noted. There is no evidence of focal consolidation, pleural effusion or pneumothorax. The cardiac silhouette is markedly enlarged. Multiple left-sided rib fractures of indeterminate age are seen. Multilevel degenerative changes seen throughout the thoracic spine. IMPRESSION: 1. Evidence of prior median sternotomy/CABG. 2. Chronic appearing increased interstitial lung markings, without evidence of acute or active cardiopulmonary disease. 3. Multiple left-sided rib fractures of indeterminate age. These are not clearly identified on the prior study and may be acute in nature. Electronically Signed   By: Aram Candela M.D.   On: 12/07/2019 16:48   DG Shoulder Right  Result Date: 12/07/2019 CLINICAL DATA:  Status  post fall. EXAM: RIGHT SHOULDER - 2+ VIEW COMPARISON:  None. FINDINGS: There is no evidence of fracture or dislocation. A single radiopaque surgical screw is seen overlying the right humeral head. Approximately 1.8 cm widening of the right acromioclavicular joint is noted. Soft tissues are unremarkable. IMPRESSION: 1. Postoperative changes without evidence of an acute osseous abnormality. Electronically Signed   By: Aram Candela M.D.   On: 12/07/2019 16:45   DG Hip Unilat With Pelvis 2-3 Views Left  Result Date: 12/07/2019 CLINICAL DATA:  Status post fall. EXAM: DG HIP (WITH OR WITHOUT PELVIS) 2-3V LEFT COMPARISON:  None. FINDINGS: Acute fracture deformity is seen extending through the inter trochanteric region of the proximal left femur. There is superior and lateral displacement of the distal fracture site with superior angulation of the fracture apex. There is no evidence of dislocation. Soft tissue structures are unremarkable. IMPRESSION: 1. Acute inter trochanteric fracture of the proximal left femur. Electronically Signed   By: Aram Candela M.D.   On: 12/07/2019 16:44      Scheduled Meds: . [MAR Hold] ezetimibe  10 mg Oral Daily  . [MAR Hold] furosemide  20 mg Oral Daily  . [MAR Hold] insulin aspart  0-15 Units Subcutaneous TID WC  . [MAR Hold] insulin aspart  0-5 Units Subcutaneous QHS  . [MAR Hold] insulin glargine  33 Units Subcutaneous QHS  . [MAR Hold] linagliptin  5 mg Oral Daily  . [MAR Hold] metoprolol succinate  25 mg Oral Daily  . [MAR Hold] mirabegron ER  50 mg Oral Daily  . [MAR Hold] pantoprazole  40 mg Oral Daily  . [MAR Hold] pravastatin  40 mg Oral QHS  . [MAR Hold] ranolazine  500 mg Oral BID   Continuous Infusions: . lactated ringers 10 mL/hr at 12/09/19 1125     LOS: 2 days    Time spent: 15 minutes   Sharlene Dory, DO [facility] 12/09/2019, 12:08 PM   Available via Epic secure chat 7am-7pm After these hours, please refer to  coverage provider listed on [email or web address]

## 2019-12-09 NOTE — Transfer of Care (Signed)
Immediate Anesthesia Transfer of Care Note  Patient: Kelly Lee  Procedure(s) Performed: INTRAMEDULLARY (IM) NAIL FEMORAL (Left )  Patient Location: PACU  Anesthesia Type:General  Level of Consciousness: awake, alert  and oriented  Airway & Oxygen Therapy: Patient Spontanous Breathing and Patient connected to face mask oxygen  Post-op Assessment: Report given to RN and Post -op Vital signs reviewed and stable  Post vital signs: Reviewed and stable  Last Vitals:  Vitals Value Taken Time  BP 109/84 12/09/19 1414  Temp    Pulse    Resp 13 12/09/19 1414  SpO2    Vitals shown include unvalidated device data.  Last Pain:  Vitals:   12/09/19 1117  TempSrc:   PainSc: 9       Patients Stated Pain Goal: 3 (12/09/19 1117)  Complications: No apparent anesthesia complications

## 2019-12-09 NOTE — Interval H&P Note (Signed)
History and Physical Interval Note:  12/09/2019 11:56 AM  Kelly Lee  has presented today for surgery, with the diagnosis of left peri-troch hip fracture.  The various methods of treatment have been discussed with the patient and family. After consideration of risks, benefits and other options for treatment, the patient has consented to  Procedure(s): INTRAMEDULLARY (IM) NAIL FEMORAL (Left) as a surgical intervention.  The patient's history has been reviewed, patient examined, no change in status, stable for surgery.  I have reviewed the patient's chart and labs.  Questions were answered to the patient's satisfaction.     [name]

## 2019-12-09 NOTE — Progress Notes (Signed)
Orthopedic Tech Progress Note Patient Details:  Kelly Lee 06-Mar-1951 [id number] I applied an Over Head Frame with Trapeze for patient Patient ID: Kelly Lee, female   DOB: 06-26-1951, 69 y.o.   MRN: [id number]   Kelly Lee 12/09/2019, 6:22 PM

## 2019-12-09 NOTE — Anesthesia Postprocedure Evaluation (Signed)
Anesthesia Post Note  Patient: Kelly Lee  Procedure(s) Performed: INTRAMEDULLARY (IM) NAIL FEMORAL (Left )     Patient location during evaluation: PACU Anesthesia Type: General Level of consciousness: awake and alert and patient cooperative Pain management: pain level controlled Vital Signs Assessment: post-procedure vital signs reviewed and stable Respiratory status: spontaneous breathing, nonlabored ventilation, respiratory function stable and patient connected to nasal cannula oxygen Cardiovascular status: blood pressure returned to baseline and stable (brief Non-sust VT in PACU, has not reccurred) Postop Assessment: no apparent nausea or vomiting Anesthetic complications: no Comments: D/c to tele bed, given long-standing h/o ventricular ectopy    Last Vitals:  Vitals:   12/09/19 0727 12/09/19 1415  BP: (!) 120/56 109/84  Pulse: 97 100  Resp: 18 13  Temp: 37.1 C 36.7 C  SpO2: 97% 100%    Last Pain:  Vitals:   12/09/19 1415  TempSrc:   PainSc: 0-No pain                 Emelie Newsom,E. Travanti Mcmanus

## 2019-12-09 NOTE — Plan of Care (Signed)

## 2019-12-09 NOTE — Progress Notes (Addendum)
Bladder scan 470 @0400 , contacted Dr. to see if foley should be placed d/t surgery today or just an in/out cath. Will continue to monitor until advised.   0500: in/out cath ordered. Craige Cotta output.

## 2019-12-09 NOTE — Brief Op Note (Signed)
12/07/2019 - 12/09/2019  11:59 AM  PATIENT:  Coral Spikes  69 y.o. female  PRE-OPERATIVE DIAGNOSIS:  left peri-trochanteric hip fracture  POST-OPERATIVE DIAGNOSIS:  left peri-trochanteric hip fracture  PROCEDURE:  Procedure(s): INTRAMEDULLARY (IM) NAIL FEMORAL (Left)  SURGEON:  Surgeon(s) and Role:    Durene Romans, MD - Primary  PHYSICIAN ASSISTANT: Dennie Bible, PA-C  ANESTHESIA:   general  EBL:  200cc   BLOOD ADMINISTERED:none  DRAINS: none   LOCAL MEDICATIONS USED:  NONE  SPECIMEN:  No Specimen  DISPOSITION OF SPECIMEN:  N/A  COUNTS:  YES  TOURNIQUET:  * No tourniquets in log *  DICTATION: .Other Dictation: Dictation Number [id number]  PLAN OF CARE: Admit to inpatient   PATIENT DISPOSITION:  PACU - hemodynamically stable.   Delay start of Pharmacological VTE agent (>24hrs) due to surgical blood loss or risk of bleeding: no

## 2019-12-09 NOTE — Progress Notes (Signed)
Patient ID: Kelly Lee, female   DOB: 07/01/1951, 68 y.o.   MRN: [id number]  Stable, no events Ready to proceed with surgery  LLE Shortened and ER NV stable  Plan: NPO To OR for ORIF left hip consent filled out Likely PWB LLE post op depending on reduction, fixation and bone quality 

## 2019-12-09 NOTE — H&P (View-Only) (Signed)
Patient ID: Kelly Lee, female   DOB: January 10, 1951, 69 y.o.   MRN: [id number]  Stable, no events Ready to proceed with surgery  LLE Shortened and ER NV stable  Plan: NPO To OR for ORIF left hip consent filled out Likely PWB LLE post op depending on reduction, fixation and bone quality

## 2019-12-10 ENCOUNTER — Encounter: Payer: Self-pay | Admitting: *Deleted

## 2019-12-10 ENCOUNTER — Inpatient Hospital Stay (HOSPITAL_COMMUNITY): Payer: Medicare PPO

## 2019-12-10 LAB — CBC
HCT: 22.5 % — ABNORMAL LOW (ref 36.0–46.0)
Hemoglobin: 7.1 g/dL — ABNORMAL LOW (ref 12.0–15.0)
MCH: 28.1 pg (ref 26.0–34.0)
MCHC: 31.6 g/dL (ref 30.0–36.0)
MCV: 88.9 fL (ref 80.0–100.0)
Platelets: 173 10*3/uL (ref 150–400)
RBC: 2.53 MIL/uL — ABNORMAL LOW (ref 3.87–5.11)
RDW: 13.1 % (ref 11.5–15.5)
WBC: 10.4 10*3/uL (ref 4.0–10.5)
nRBC: 0 % (ref 0.0–0.2)

## 2019-12-10 LAB — BASIC METABOLIC PANEL
Anion gap: 8 (ref 5–15)
BUN: 12 mg/dL (ref 8–23)
CO2: 27 mmol/L (ref 22–32)
Calcium: 8.8 mg/dL — ABNORMAL LOW (ref 8.9–10.3)
Chloride: 99 mmol/L (ref 98–111)
Creatinine, Ser: 0.82 mg/dL (ref 0.44–1.00)
GFR calc Af Amer: >60 mL/min (ref >60)
GFR calc non Af Amer: >60 mL/min (ref >60)
Glucose, Bld: 285 mg/dL — ABNORMAL HIGH (ref 70–99)
Potassium: 4.6 mmol/L (ref 3.5–5.1)
Sodium: 134 mmol/L — ABNORMAL LOW (ref 135–145)

## 2019-12-10 LAB — GLUCOSE, CAPILLARY
Glucose-Capillary: 246 mg/dL — ABNORMAL HIGH (ref 70–99)
Glucose-Capillary: 253 mg/dL — ABNORMAL HIGH (ref 70–99)
Glucose-Capillary: 313 mg/dL — ABNORMAL HIGH (ref 70–99)
Glucose-Capillary: 274 mg/dL — ABNORMAL HIGH (ref 70–99)

## 2019-12-10 MED ORDER — SODIUM CHLORIDE 0.9 % IV BOLUS
500.0000 mL | Freq: Once | INTRAVENOUS | Status: AC
  Administered 2019-12-10: 22:00:00 500 mL via INTRAVENOUS

## 2019-12-10 MED ORDER — HYDROCODONE-ACETAMINOPHEN 5-325 MG PO TABS: 1.0000 | ORAL_TABLET | Freq: Four times a day (QID) | ORAL | 0 refills | Status: DC | PRN

## 2019-12-10 MED ORDER — SODIUM CHLORIDE 0.9 % IV BOLUS
500.0000 mL | Freq: Once | INTRAVENOUS | Status: AC
  Administered 2019-12-10: 23:00:00 500 mL via INTRAVENOUS

## 2019-12-10 MED ORDER — INSULIN GLARGINE 100 UNIT/ML ~~LOC~~ SOLN
36.0000 [IU] | Freq: Every day | SUBCUTANEOUS | Status: DC
  Administered 2019-12-10 – 2019-12-11 (×2): 36 [IU] via SUBCUTANEOUS
  Filled 2019-12-10 (×4): qty 0.36

## 2019-12-10 MED ORDER — INSULIN ASPART 100 UNIT/ML ~~LOC~~ SOLN
0.0000 [IU] | Freq: Three times a day (TID) | SUBCUTANEOUS | Status: DC
  Administered 2019-12-10: 15 [IU] via SUBCUTANEOUS
  Administered 2019-12-11 (×2): 11 [IU] via SUBCUTANEOUS
  Administered 2019-12-12 (×3): 7 [IU] via SUBCUTANEOUS
  Administered 2019-12-13 (×2): 3 [IU] via SUBCUTANEOUS
  Administered 2019-12-13: 12:00:00 11 [IU] via SUBCUTANEOUS
  Administered 2019-12-14: 17:00:00 7 [IU] via SUBCUTANEOUS
  Administered 2019-12-14 – 2019-12-15 (×3): 4 [IU] via SUBCUTANEOUS
  Administered 2019-12-15: 7 [IU] via SUBCUTANEOUS
  Administered 2019-12-16: 3 [IU] via SUBCUTANEOUS
  Administered 2019-12-16 – 2019-12-17 (×2): 4 [IU] via SUBCUTANEOUS

## 2019-12-10 NOTE — Progress Notes (Addendum)
     Subjective: 1 Day Post-Op Procedure(s) (LRB): INTRAMEDULLARY (IM) NAIL FEMORAL (Left)   Patient reports pain as moderate, not fully controlled with APAP.  Discussed the fracture, procedure and expectations moving forward.   Objective:   VITALS:   Vitals:   12/10/19 0333 12/10/19 0725  BP: (!) 122/55 118/77  Pulse: (!) 48 (!) 46  Resp: 16 17  Temp: (!) 97.5 F (36.4 C) (!) 97.4 F (36.3 C)  SpO2: 100% 97%    Dorsiflexion/Plantar flexion intact Incision: dressing C/D/I No cellulitis present Compartment soft  LABS Recent Labs    12/08/19 0302 12/09/19 0255 12/10/19 0222  HGB 10.6* 9.0* 7.1*  HCT 33.8* 28.3* 22.5*  WBC 10.9* 10.6* 10.4  PLT 229 183 173    Recent Labs    12/08/19 0302 12/09/19 0255 12/10/19 0222  NA 135 135 134*  K 4.6 4.5 4.6  BUN 12 16 12   CREATININE 0.92 0.82 0.82  GLUCOSE 296* 169* 285*     Assessment/Plan: 1 Day Post-Op Procedure(s) (LRB): INTRAMEDULLARY (IM) NAIL FEMORAL (Left) Advance diet Up with therapy Discharge disposition TBD   Ortho recommendations:  Eliquis for anticoagulation, was on prior to surgery.  Norco for pain management (Rx written).  Already on Zanaflex for muscle spasms.  MiraLax and Colace for constipation  Iron 325 mg tid for 2-3 weeks   PWB 50% on the left leg.  Dressing to remain in place until follow in clinic in 2 weeks.  Dressing is waterproof and may shower with it in place.  Follow up in 2 weeks at [facility]. Follow up with OLIN,Tristram Milian D in 2 weeks.  Contact information:  [facility] [address] [place] [address]        [phone]. Kelly Lee   PAC  12/10/2019, 7:46 AM

## 2019-12-10 NOTE — Progress Notes (Addendum)
PROGRESS NOTE    Kelly Lee  GYJ:[id number] DOB: Nov 03, 1951 DOA: 12/07/2019 PCP: Oval Linsey, PA-C     Brief Narrative:  Patient is a 69 year old female with a history of atrial fibrillation/flutter on Eliquis, hypertension, diabetes, and CHF. She had a fall at home on 1/24 and was admitted for a left trochanteric fracture. Pain control until surgery.    New events last 24 hours / Subjective: Reports still having pain but feels better overall since procedure yesterday.  Going to start trying to move today.  No adverse effects from pain medication.  Does note some left knee pain that she has noticed since she fell.  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             Continue pravastatin 40 mg daily             Continue Ranexa 500 mg twice daily    OAB             Continue Myrbetriq 50 mg daily    Morbid obesity with BMI of 40.0-44.9, adult (HCC)    PAF (paroxysmal atrial fibrillation) (HCC)             Continue metoprolol XL 25 mg/d    Type 2 diabetes mellitus, with long-term current use of insulin (HCC)             Sliding scale insulin             Basal bolus insulin                   Tradjenta 5 mg daily             Low-carb diet when able to eat  Dyslipidemia             Zetia 10 mg daily  DVT prophylaxis: Eliquis Code Status: Full Family Communication: Self Disposition Plan:  Rehab vs Home w HH; barriers to d/c include uncertain capacity (will be eval'd by PT), potential bed availability issues   Consultants:   Ortho  Procedures:   ORIF, L femur  Antimicrobials:  Anti-infectives (From admission, onward)   Start     Dose/Rate Route Frequency Ordered Stop   12/09/19 1445  vancomycin (VANCOCIN) IVPB 1000 mg/200 mL premix     1,000 mg 200 mL/hr over 60 Minutes Intravenous Every 12 hours 12/09/19 1442 12/09/19 1800        Objective: Vitals:   12/09/19 1500 12/09/19 1951 12/10/19 0333 12/10/19 0725  BP: 133/63 (!) 105/57 (!) 122/55 118/77  Pulse: 95 93 (!) 48 (!) 46  Resp: 16 16 16 17   Temp: 98.5 F (36.9 C) 97.8 F (36.6 C) (!) 97.5 F (36.4 C) (!)  97.4 F (36.3 C)  TempSrc: Oral  Oral Oral  SpO2: 97% 100% 100% 97%  Weight:      Height:        Intake/Output Summary (Last 24 hours) at 12/10/2019 1331 Last data filed at 12/10/2019 1100 Gross per 24 hour  Intake 1670 ml  Output 2250 ml  Net -580 ml   Filed Weights   12/07/19 1504 12/09/19 1117  Weight: 93 kg 93 kg    Examination:  General exam: Appears calm and comfortable  Respiratory system: Clear to auscultation. Respiratory effort normal. No respiratory distress. No conversational dyspnea.  Cardiovascular system: S1 & S2 heard, RRR. No pedal edema. Gastrointestinal system: Abdomen is nondistended, soft and nontender. Normal bowel sounds heard. Central nervous system: Alert and oriented. No focal neurological deficits. Speech clear.  Extremities: Symmetric in appearance  Skin: No rashes, lesions or ulcers on exposed skin  Psychiatry: Judgement and insight appear normal. Mood & affect appropriate.   Data Reviewed: I have personally reviewed following labs and imaging studies  CBC: Recent Labs  Lab 12/07/19 1532 12/08/19 0302 12/09/19 0255 12/10/19 0222  WBC 11.2* 10.9* 10.6* 10.4  NEUTROABS 8.4*  --   --   --   HGB 11.6* 10.6* 9.0* 7.1*  HCT 36.2 33.8* 28.3* 22.5*  MCV  90.3 89.9 90.7 88.9  PLT 245 229 183 173   Basic Metabolic Panel: Recent Labs  Lab 12/07/19 1532 12/08/19 0302 12/09/19 0255 12/10/19 0222  NA 136 135 135 134*  K 4.3 4.6 4.5 4.6  CL 103 98 99 99  CO2 27 26 29 27   GLUCOSE 180* 296* 169* 285*  BUN 14 12 16 12   CREATININE 0.73 0.92 0.82 0.82  CALCIUM 8.4* 8.9 8.7* 8.8*   GFR: Estimated Creatinine Clearance: 67.4 mL/min (by C-G formula based on SCr of 0.82 mg/dL). Liver Function Tests: Recent Labs  Lab 12/07/19 1532  AST 16  ALT 12  ALKPHOS 52  BILITOT 0.8  PROT 6.7  ALBUMIN 3.4*   HbA1C: Recent Labs    12/07/19 1532  HGBA1C 7.5*   CBG: Recent Labs  Lab 12/09/19 1416 12/09/19 1611 12/09/19 2225 12/10/19 0635 12/10/19 1154  GLUCAP 180* 240* 306* 246* 253*   Recent Results (from the past 240 hour(s))  Respiratory Panel by RT PCR (Flu A&B, Covid) - Nasopharyngeal Swab     Status: None   Collection Time: 12/07/19  4:53 PM   Specimen: Nasopharyngeal Swab  Result Value Ref Range Status   SARS Coronavirus 2 by RT PCR NEGATIVE NEGATIVE Final    Comment: (NOTE) SARS-CoV-2 target nucleic acids are NOT DETECTED. The SARS-CoV-2 RNA is generally detectable in upper respiratoy specimens during the acute phase of infection. The lowest concentration of SARS-CoV-2 viral copies this assay can detect is 131 copies/mL. A negative result does not preclude SARS-Cov-2 infection and should not be used as the sole basis for treatment or other patient management decisions. A negative result may occur with  improper specimen collection/handling, submission of specimen other than nasopharyngeal swab, presence of viral mutation(s) within the areas targeted by this assay, and inadequate number of viral copies (<131 copies/mL). A negative result must be combined with clinical observations, patient history, and epidemiological information. The expected result is Negative. Fact Sheet for Patients:   12/12/19 Fact Sheet for Healthcare Providers:  12/09/19 This test is not yet ap proved or cleared by the https://www.moore.com/ FDA and  has been authorized for detection and/or diagnosis of SARS-CoV-2 by FDA under  an Emergency Use Authorization (EUA). This EUA will remain  in effect (meaning this test can be used) for the duration of the COVID-19 declaration under Section 564(b)(1) of the Act, 21 U.S.C. section 360bbb-3(b)(1), unless the authorization is terminated or revoked sooner.    Influenza A by PCR NEGATIVE NEGATIVE Final   Influenza B by PCR NEGATIVE NEGATIVE Final    Comment: (NOTE) The Xpert Xpress SARS-CoV-2/FLU/RSV assay is intended as an aid in  the diagnosis of influenza from Nasopharyngeal swab specimens and  should not be used as a sole basis for treatment. Nasal washings and  aspirates are unacceptable for Xpert Xpress SARS-CoV-2/FLU/RSV  testing. Fact Sheet for Patients: PinkCheek.be Fact Sheet for Healthcare Providers: GravelBags.it This test is not yet approved or cleared by the [place] FDA and  has been authorized for detection and/or diagnosis of SARS-CoV-2 by  FDA under an Emergency Use Authorization (EUA). This EUA will remain  in effect (meaning this test can be used) for the duration of the  Covid-19 declaration under Section 564(b)(1) of the Act, 21  U.S.C. section 360bbb-3(b)(1), unless the authorization is  terminated or revoked. Performed at [facility], [place] [address], [place], [place] [address]   Surgical pcr screen     Status: None   Collection Time: 12/08/19  2:22 AM   Specimen: Nasal Mucosa; Nasal Swab  Result Value Ref Range Status   MRSA, PCR NEGATIVE NEGATIVE Final   Staphylococcus aureus NEGATIVE NEGATIVE Final    Comment: (NOTE) The Xpert SA Assay (FDA approved for NASAL specimens in patients 3 years of age  and older), is one component of a comprehensive surveillance program. It is not intended to diagnose infection nor to guide or monitor treatment. Performed at [facility], [place] [address] [place], [place] [address]      Radiology Studies: DG C-Arm 1-60 Min  Result Date: 12/09/2019 CLINICAL DATA:  Malnutition femur, left femur IT fracture EXAM: DG C-ARM 1-60 MIN; LEFT FEMUR 2 VIEWS COMPARISON:  12/07/2019 FINDINGS: A series of fluoroscopic spot images document placement of IM rod and sliding screw across intertrochanteric femur fracture. IMPRESSION: Internal fixation of left intertrochanteric femur fracture. Electronically Signed   By: Lucrezia Europe M.D.   On: 12/09/2019 13:46   DG FEMUR MIN 2 VIEWS LEFT  Result Date: 12/09/2019 CLINICAL DATA:  Malnutition femur, left femur IT fracture EXAM: DG C-ARM 1-60 MIN; LEFT FEMUR 2 VIEWS COMPARISON:  12/07/2019 FINDINGS: A series of fluoroscopic spot images document placement of IM rod and sliding screw across intertrochanteric femur fracture. IMPRESSION: Internal fixation of left intertrochanteric femur fracture. Electronically Signed   By: Lucrezia Europe M.D.   On: 12/09/2019 13:46    Scheduled Meds: . apixaban  2.5 mg Oral BID  . docusate sodium  100 mg Oral BID  . ezetimibe  10 mg Oral Daily  . ferrous sulfate  325 mg Oral TID PC  . furosemide  20 mg Oral Daily  . insulin aspart  0-15 Units Subcutaneous TID WC  . insulin aspart  0-5 Units Subcutaneous QHS  . insulin glargine  33 Units Subcutaneous QHS  . linagliptin  5 mg Oral Daily  . metoprolol succinate  25 mg Oral Daily  . mirabegron ER  50 mg Oral Daily  . pantoprazole  40 mg Oral Daily  . pravastatin  40 mg Oral QHS  . ranolazine  500 mg Oral BID     LOS: 3 days    Time spent:  25 minutes   Jilda Roche [place], DO [facility] 12/10/2019, 1:31 PM   Available via Epic secure chat 7am-7pm After these hours, please refer to coverage provider listed on [email or web address]

## 2019-12-10 NOTE — Progress Notes (Signed)
Inpatient Diabetes Program Recommendations  AACE/ADA: New Consensus Statement on Inpatient Glycemic Control (2015)  Target Ranges:  Prepandial:   less than 140 mg/dL      Peak postprandial:   less than 180 mg/dL (1-2 hours)      Critically ill patients:  140 - 180 mg/dL   Lab Results  Component Value Date   GLUCAP 246 (H) 12/10/2019   HGBA1C 7.5 (H) 12/07/2019   Results for LYNDALL, BELLOT (MRN [id number]) as of 12/10/2019 09:28  Ref. Range 12/09/2019 14:16 12/09/2019 16:11 12/09/2019 22:25 12/10/2019 06:35  Glucose-Capillary Latest Ref Range: 70 - 99 mg/dL 885 (H) 027 (H) 741 (H) 246 (H)   Diabetes history: Type 2 DM Outpatient Diabetes medications: Januvia 100 mg QD, Metformin 500 mg TID, Lantus 33 units QHS Current orders for Inpatient glycemic control: Lantus 33 units QHS, Tradjenta 5 mg QD, Novolog 0-15 units TID, Novolog 0-5 units QHS Decadron 5 mg X1   Inpatient Diabetes Program Recommendations:   Anticipate for glucose to remain increased due to steroids administration on 1/26.   Consider: - increasing Lantus to 36 units QHS.  - Changing diet to carb modified -Changing correction to Novolog 0-20 units TID  Thanks, Lujean Rave, MSN, RNC-OB Diabetes Coordinator [phone] (8a-5p)

## 2019-12-10 NOTE — Progress Notes (Addendum)
   Vital Signs MEWS/VS Documentation      12/10/2019 1900 12/10/2019 2010 12/10/2019 2041 12/10/2019 2059   MEWS Score:  0  2  1  1    MEWS Score Color:  Green  Yellow  Green  Green   Resp:  --  16  --  --   Pulse:  --  (!) 43  (!) 57  --   BP:  --  (!) 89/47  (!) 81/47  --   Temp:  --  98.4 F (36.9 C)  --  --   O2 Device:  --  Room Air  --  --   Level of Consciousness:  --  --  --  Alert      Patients BP low at 81/47(59), HR: 57. Patient is not complaining of and weakness, dizziness, chest pain, or surgical pain. Cardiac monitor shows vent bigeminy. Patient states that she feels fine. No bleeding at surgical site. MD notified & bolus & labs for potasium and magnesium ordered.   2300: BP 89/48(60), HR 79, RR 16. Still no complaints. Did complain of chest pain before night meds given, but has resolved after Ranexa given. In bed watching TV and playing on phone.  MD notified of update. Will continue to monitor. Second bolus ordered.   0225:BP still low. 3rd bolus ordered & 2 units of blood to be transfused. No complaints of dizziness, chest pain/surgical pain. No shortness of breath. Lungs clear and diminished.      Kiearra Oyervides 12/10/2019,9:22 PM

## 2019-12-10 NOTE — Plan of Care (Signed)

## 2019-12-10 NOTE — Evaluation (Signed)
Physical Therapy Evaluation Patient Details Name: Kelly Igoe MRN: [id number] DOB: 1951/02/25 Today's Date: 12/10/2019   History of Present Illness  Pt is a 69 y/o female admitted after falling at home in which she sustained a complex, comminuted peri-trochanteric L hip fracture and R shoulder contusion. Pt is now s/p IM nail. PMH including but not limited to a-fib, CHF, DM and HTN.    Clinical Impression  Pt presented supine in bed with HOB elevated, awake and willing to participate in therapy session. Prior to admission, pt reported that she ambulated with use of a cane for community distances and was independent with ADLs. Pt lives with her daughter and grandchildren in a single level home with several steps to enter. At the time of evaluation, pt significantly limited with mobility secondary to pain and L LE weakness. She required mod A for bed mobility and was only able to partially achieve upright sitting at EOB while maintaining L LE propped on side of bed. Unable to tolerate any further movement and transport arriving to take pt for an x-ray of her L knee. Pt will very likely require post-acute rehab at a SNF prior to returning home with family support. Discussed with pt who also feels that her family would be unable to care for her in this current status. Pt would continue to benefit from skilled physical therapy services at this time while admitted and after d/c to address the below listed limitations in order to improve overall safety and independence with functional mobility.     Follow Up Recommendations SNF    Equipment Recommendations  None recommended by PT    Recommendations for Other Services       Precautions / Restrictions Precautions Precautions: Fall Restrictions Weight Bearing Restrictions: Yes LLE Weight Bearing: Partial weight bearing LLE Partial Weight Bearing Percentage or Pounds: 50%      Mobility  Bed Mobility Overal bed mobility: Needs Assistance Bed  Mobility: Supine to Sit;Sit to Supine     Supine to sit: Mod assist Sit to supine: Mod assist   General bed mobility comments: greatly increased time needed as pt very anxious about moving her L LE at all. Pt able to manage trunk and upper body throughout with use of bed features. She required assistance only for L LE movement  Transfers                 General transfer comment: deferred secondary to transport arriving to take pt to X-RAY  Ambulation/Gait                Stairs            Wheelchair Mobility    Modified Rankin (Stroke Patients Only)       Balance Overall balance assessment: Needs assistance;History of Falls Sitting-balance support: No upper extremity supported Sitting balance-Leahy Scale: Fair                                       Pertinent Vitals/Pain Pain Assessment: Faces Faces Pain Scale: Hurts even more Pain Location: L hip and knee Pain Descriptors / Indicators: Grimacing;Guarding Pain Intervention(s): Monitored during session;Repositioned    Home Living Family/patient expects to be discharged to:: Private residence Living Arrangements: Children;Other relatives Available Help at Discharge: Family Type of Home: House Home Access: Stairs to enter Entrance Stairs-Rails: Psychiatric nurse of Steps: 5 Home Layout: One level Home Equipment: Environmental consultant -  2 wheels;Cane - single point;Bedside commode;Shower seat      Prior Function Level of Independence: Independent with assistive device(s)         Comments: ambulates with cane     Hand Dominance        Extremity/Trunk Assessment   Upper Extremity Assessment Upper Extremity Assessment: Generalized weakness    Lower Extremity Assessment Lower Extremity Assessment: LLE deficits/detail LLE Deficits / Details: decreased strength and ROM limitations secondary to post-op pain and weakness LLE: Unable to fully assess due to pain        Communication   Communication: No difficulties  Cognition Arousal/Alertness: Awake/alert Behavior During Therapy: Anxious Overall Cognitive Status: Within Functional Limits for tasks assessed                                 General Comments: cognition not formally assessed but [facility] for general conversation      General Comments      Exercises     Assessment/Plan    PT Assessment Patient needs continued PT services  PT Problem List Decreased strength;Decreased mobility;Decreased range of motion;Decreased activity tolerance;Decreased balance;Decreased coordination;Decreased knowledge of use of DME;Decreased safety awareness;Decreased knowledge of precautions;Pain       PT Treatment Interventions DME instruction;Gait training;Stair training;Functional mobility training;Therapeutic activities;Therapeutic exercise;Balance training;Neuromuscular re-education;Patient/family education    PT Goals (Current goals can be found in the Care Plan section)  Acute Rehab PT Goals Patient Stated Goal: decrease pain PT Goal Formulation: With patient Time For Goal Achievement: 12/24/19 Potential to Achieve Goals: Good    Frequency Min 3X/week   Barriers to discharge        Co-evaluation               AM-PAC PT "6 Clicks" Mobility  Outcome Measure Help needed turning from your back to your side while in a flat bed without using bedrails?: A Lot Help needed moving from lying on your back to sitting on the side of a flat bed without using bedrails?: A Lot Help needed moving to and from a bed to a chair (including a wheelchair)?: A Lot Help needed standing up from a chair using your arms (e.g., wheelchair or bedside chair)?: A Lot Help needed to walk in hospital room?: Total Help needed climbing 3-5 steps with a railing? : Total 6 Click Score: 10    End of Session   Activity Tolerance: Patient limited by pain Patient left: in bed;with call bell/phone within  reach;Other (comment)(transport present to take pt to x-ray) Nurse Communication: Mobility status PT Visit Diagnosis: Other abnormalities of gait and mobility (R26.89);Pain Pain - Right/Left: Left Pain - part of body: Hip;Knee    Time: 0175-1025 PT Time Calculation (min) (ACUTE ONLY): 16 min   Charges:   PT Evaluation $PT Eval Moderate Complexity: 1 Mod          Ginette Pitman, PT, DPT  Acute Rehabilitation Services Pager [phone] Office [phone]    Alessandra Bevels Kea Callan 12/10/2019, 4:28 PM

## 2019-12-11 DIAGNOSIS — D649 Anemia, unspecified: Secondary | ICD-10-CM

## 2019-12-11 LAB — MAGNESIUM: Magnesium: 1.7 mg/dL (ref 1.7–2.4)

## 2019-12-11 LAB — CBC
HCT: 16.3 % — ABNORMAL LOW (ref 36.0–46.0)
HCT: 26.1 % — ABNORMAL LOW (ref 36.0–46.0)
Hemoglobin: 5.3 g/dL — CL (ref 12.0–15.0)
Hemoglobin: 8.6 g/dL — ABNORMAL LOW (ref 12.0–15.0)
MCH: 29.1 pg (ref 26.0–34.0)
MCH: 28.4 pg (ref 26.0–34.0)
MCHC: 32.5 g/dL (ref 30.0–36.0)
MCHC: 33 g/dL (ref 30.0–36.0)
MCV: 89.6 fL (ref 80.0–100.0)
MCV: 86.1 fL (ref 80.0–100.0)
Platelets: 161 10*3/uL (ref 150–400)
Platelets: 234 10*3/uL (ref 150–400)
RBC: 1.82 MIL/uL — ABNORMAL LOW (ref 3.87–5.11)
RBC: 3.03 MIL/uL — ABNORMAL LOW (ref 3.87–5.11)
RDW: 13.4 % (ref 11.5–15.5)
RDW: 15.5 % (ref 11.5–15.5)
WBC: 9.5 10*3/uL (ref 4.0–10.5)
WBC: 13.3 10*3/uL — ABNORMAL HIGH (ref 4.0–10.5)
nRBC: 0 % (ref 0.0–0.2)
nRBC: 0.5 % — ABNORMAL HIGH (ref 0.0–0.2)

## 2019-12-11 LAB — PREPARE RBC (CROSSMATCH)

## 2019-12-11 LAB — BASIC METABOLIC PANEL
Anion gap: 6 (ref 5–15)
BUN: 20 mg/dL (ref 8–23)
CO2: 26 mmol/L (ref 22–32)
Calcium: 8.1 mg/dL — ABNORMAL LOW (ref 8.9–10.3)
Chloride: 99 mmol/L (ref 98–111)
Creatinine, Ser: 0.8 mg/dL (ref 0.44–1.00)
GFR calc Af Amer: >60 mL/min (ref >60)
GFR calc non Af Amer: >60 mL/min (ref >60)
Glucose, Bld: 288 mg/dL — ABNORMAL HIGH (ref 70–99)
Potassium: 4.6 mmol/L (ref 3.5–5.1)
Sodium: 131 mmol/L — ABNORMAL LOW (ref 135–145)

## 2019-12-11 LAB — GLUCOSE, CAPILLARY
Glucose-Capillary: 289 mg/dL — ABNORMAL HIGH (ref 70–99)
Glucose-Capillary: 285 mg/dL — ABNORMAL HIGH (ref 70–99)
Glucose-Capillary: 112 mg/dL — ABNORMAL HIGH (ref 70–99)
Glucose-Capillary: 191 mg/dL — ABNORMAL HIGH (ref 70–99)

## 2019-12-11 MED ORDER — METFORMIN HCL 500 MG PO TABS
500.0000 mg | ORAL_TABLET | Freq: Three times a day (TID) | ORAL | Status: DC
  Administered 2019-12-11 – 2019-12-17 (×19): 500 mg via ORAL
  Filled 2019-12-11 (×19): qty 1

## 2019-12-11 MED ORDER — SODIUM CHLORIDE 0.9% IV SOLUTION: Freq: Once | INTRAVENOUS | Status: AC

## 2019-12-11 MED ORDER — SODIUM CHLORIDE 0.9 % IV BOLUS
250.0000 mL | Freq: Once | INTRAVENOUS | Status: AC
  Administered 2019-12-11: 02:00:00 250 mL via INTRAVENOUS

## 2019-12-11 NOTE — Progress Notes (Signed)
PROGRESS NOTE    Kelly Lee  OIN:[id number] DOB: Mar 12, 1951 DOA: 12/07/2019 PCP: Oval Linsey, PA-C     Brief Narrative:  Patient is a 69 year old female with a history of atrial fibrillation/flutter on Eliquis, hypertension, diabetes, and CHF. She had a fall at home on 1/24 and was admitted for a left trochanteric fracture.  On 12/09/2019, she had surgery on her hip.  She recovered well.   New events last 24 hours / Subjective: Overnight, hemoglobin 5.3.  She received 1.5 units of blood before losing IV access.  She received a bolus as well.  Blood pressures have been stable.  She has not had a solid bowel movement but notices no blood in her urine.  She did report chest pain while receiving her second unit, states it is normal and it resolved spontaneously.  Is not brought on by exertion. This is not a new occurrence.   Assessment & Plan:   Principal Problem:   Closed intertrochanteric fracture, left, initial encounter (HCC)             Appreciate ortho             S/p ORIF             Norco 7.5-325 q 4 hrs prn severe pain             Norco 5-325 mg q 4 hrs prn moderate pain             Tylenol prn mild pain             Zanaflex 4 mg TID prn             PT   Anemia  S/p 1.5 units PRBCs  Waiting on post transfusion CBC  Transfuse as needed  Site looks clean without bloody oozing or hematoma  Ck UA and occult blood  Change Eliquis to SCDs  Hypertension Continue metoprolol XR 25 mg daily  Anticoagulant long-term use Resuming Eliquis 2.5 mg bid  Chronic respiratory failure with hypoxia (HCC) Continue oxygen, at home levels right now  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 Continue pravastatin 40 mg daily Continue Ranexa 500 mg twice daily  OAB Continue Myrbetriq 50 mg daily  Morbid obesity with BMI of  40.0-44.9, adult (HCC)  PAF (paroxysmal atrial fibrillation) (HCC) Continue metoprolol XL 25 mg/d  Hold Eliquis given recent drop in Hb  Type 2 diabetes mellitus, with long-term current use of insulin (HCC) Sliding scale insulin Basal bolus insulin Tradjenta 5 mg daily  Add back metformin 500 mg tid given renal function Low-carb diet when able to eat  Dyslipidemia Zetia 10 mg daily  DVT prophylaxis: SCDs Code Status: Full  Family Communication: Self Disposition Plan: Likely SNF; coming from home; barriers to d/c include possible bleeding and bed availability   Consultants:   Ortho  Procedures:   ORIF 1/26  Antimicrobials:  Anti-infectives (From admission, onward)   Start     Dose/Rate Route Frequency Ordered Stop   12/09/19 1445  vancomycin (VANCOCIN) IVPB 1000 mg/200 mL premix     1,000 mg 200 mL/hr over 60 Minutes Intravenous Every 12 hours 12/09/19 1442 12/09/19 1800        Objective: Vitals:   12/11/19 0825 12/11/19 0840 12/11/19 1020 12/11/19 1102  BP: (!) 118/50 (!) 118/52 (!) 116/50 (!) 115/53  Pulse: (!) 52 (!) 54 78 (!) 45  Resp: 17 18 18    Temp: 98.6 F (37 C) 98.5  F (36.9 C) 98.6 F (37 C) 98.8 F (37.1 C)  TempSrc: Oral  Oral Oral  SpO2: 93% 94%  99%  Weight:      Height:        Intake/Output Summary (Last 24 hours) at 12/11/2019 1231 Last data filed at 12/11/2019 1020 Gross per 24 hour  Intake 2478.76 ml  Output 950 ml  Net 1528.76 ml   Filed Weights   12/07/19 1504 12/09/19 1117  Weight: 93 kg 93 kg    Examination:  General exam: Appears calm and comfortable  Respiratory system: Clear to auscultation. Respiratory effort normal. No respiratory distress. No conversational dyspnea.  Cardiovascular system: S1 & S2 heard, RRR. No murmurs. No pedal edema. Gastrointestinal system: Abdomen is nondistended, soft and nontender. Normal bowel sounds  heard. Central nervous system: Alert and oriented. No focal neurological deficits. Speech clear.  Extremities: Symmetric in appearance; dressings appear dry, no hematoma or evidence of bleeding  Skin: No rashes, lesions or ulcers on exposed skin  Psychiatry: Judgement and insight appear normal. Mood & affect appropriate.   Data Reviewed: I have personally reviewed following labs and imaging studies  CBC: Recent Labs  Lab 12/07/19 1532 12/08/19 0302 12/09/19 0255 12/10/19 0222 12/11/19 0117  WBC 11.2* 10.9* 10.6* 10.4 9.5  NEUTROABS 8.4*  --   --   --   --   HGB 11.6* 10.6* 9.0* 7.1* 5.3*  HCT 36.2 33.8* 28.3* 22.5* 16.3*  MCV 90.3 89.9 90.7 88.9 89.6  PLT 245 229 183 173 941   Basic Metabolic Panel: Recent Labs  Lab 12/07/19 1532 12/08/19 0302 12/09/19 0255 12/10/19 0222 12/11/19 0117  NA 136 135 135 134* 131*  K 4.3 4.6 4.5 4.6 4.6  CL 103 98 99 99 99  CO2 27 26 29 27 26   GLUCOSE 180* 296* 169* 285* 288*  BUN 14 12 16 12 20   CREATININE 0.73 0.92 0.82 0.82 0.80  CALCIUM 8.4* 8.9 8.7* 8.8* 8.1*  MG  --   --   --   --  1.7   GFR: Estimated Creatinine Clearance: 69 mL/min (by C-G formula based on SCr of 0.8 mg/dL). Liver Function Tests: Recent Labs  Lab 12/07/19 1532  AST 16  ALT 12  ALKPHOS 52  BILITOT 0.8  PROT 6.7  ALBUMIN 3.4*   CBG: Recent Labs  Lab 12/10/19 1154 12/10/19 1621 12/10/19 2143 12/11/19 0637 12/11/19 1122  GLUCAP 253* 313* 274* 289* 285*   Recent Results (from the past 240 hour(s))  Respiratory Panel by RT PCR (Flu A&B, Covid) - Nasopharyngeal Swab     Status: None   Collection Time: 12/07/19  4:53 PM   Specimen: Nasopharyngeal Swab  Result Value Ref Range Status   SARS Coronavirus 2 by RT PCR NEGATIVE NEGATIVE Final    Comment: (NOTE) SARS-CoV-2 target nucleic acids are NOT DETECTED. The SARS-CoV-2 RNA is generally detectable in upper respiratoy specimens during the acute phase of infection. The lowest concentration of  SARS-CoV-2 viral copies this assay can detect is 131 copies/mL. A negative result does not preclude SARS-Cov-2 infection and should not be used as the sole basis for treatment or other patient management decisions. A negative result may occur with  improper specimen collection/handling, submission of specimen other than nasopharyngeal swab, presence of viral mutation(s) within the areas targeted by this assay, and inadequate number of viral copies (<131 copies/mL). A negative result must be combined with clinical observations, patient history, and epidemiological information. The expected result is Negative. Fact  Sheet for Patients:  https://www.moore.com/ Fact Sheet for Healthcare Providers:  https://www.young.biz/ This test is not yet ap proved or cleared by the [place] FDA and  has been authorized for detection and/or diagnosis of SARS-CoV-2 by FDA under an Emergency Use Authorization (EUA). This EUA will remain  in effect (meaning this test can be used) for the duration of the COVID-19 declaration under Section 564(b)(1) of the Act, 21 U.S.C. section 360bbb-3(b)(1), unless the authorization is terminated or revoked sooner.    Influenza A by PCR NEGATIVE NEGATIVE Final   Influenza B by PCR NEGATIVE NEGATIVE Final    Comment: (NOTE) The Xpert Xpress SARS-CoV-2/FLU/RSV assay is intended as an aid in  the diagnosis of influenza from Nasopharyngeal swab specimens and  should not be used as a sole basis for treatment. Nasal washings and  aspirates are unacceptable for Xpert Xpress SARS-CoV-2/FLU/RSV  testing. Fact Sheet for Patients: https://www.moore.com/ Fact Sheet for Healthcare Providers: https://www.young.biz/ This test is not yet approved or cleared by the [place] FDA and  has been authorized for detection and/or diagnosis of SARS-CoV-2 by  FDA under an Emergency Use Authorization (EUA).  This EUA will remain  in effect (meaning this test can be used) for the duration of the  Covid-19 declaration under Section 564(b)(1) of the Act, 21  U.S.C. section 360bbb-3(b)(1), unless the authorization is  terminated or revoked. Performed at [facility], [address], [place], [place] [address]   Surgical pcr screen     Status: None   Collection Time: 12/08/19  2:22 AM   Specimen: Nasal Mucosa; Nasal Swab  Result Value Ref Range Status   MRSA, PCR NEGATIVE NEGATIVE Final   Staphylococcus aureus NEGATIVE NEGATIVE Final    Comment: (NOTE) The Xpert SA Assay (FDA approved for NASAL specimens in patients 68 years of age and older), is one component of a comprehensive surveillance program. It is not intended to diagnose infection nor to guide or monitor treatment. Performed at [facility], [address], [place], [place] [address]       Radiology Studies: DG Knee Complete 4 Views Left  Result Date: 12/10/2019 CLINICAL DATA:  Left knee pain. The patient suffered a left hip fracture in a fall 12/07/2019. Initial encounter. EXAM: LEFT KNEE - COMPLETE 4+ VIEW COMPARISON:  None. FINDINGS: There is no acute bony or joint abnormality. The patient has advanced osteoarthritis about the knee which appears worst in the patellofemoral compartment. Chondrocalcinosis is seen. No joint effusion. Distal end of an intramedullary nail in the femur and vascular clips in the medial soft tissues of the lower leg noted. IMPRESSION: No acute abnormality. Advanced tricompartmental osteoarthritis. Electronically Signed   By: Drusilla Kanner M.D.   On: 12/10/2019 15:40   DG C-Arm 1-60 Min  Result Date: 12/09/2019 CLINICAL DATA:  Malnutition femur, left femur IT fracture EXAM: DG C-ARM 1-60 MIN; LEFT FEMUR 2 VIEWS COMPARISON:  12/07/2019 FINDINGS: A series of fluoroscopic spot images document placement of IM rod and sliding screw across intertrochanteric femur fracture. IMPRESSION: Internal  fixation of left intertrochanteric femur fracture. Electronically Signed   By: Corlis Leak M.D.   On: 12/09/2019 13:46   DG FEMUR MIN 2 VIEWS LEFT  Result Date: 12/09/2019 CLINICAL DATA:  Malnutition femur, left femur IT fracture EXAM: DG C-ARM 1-60 MIN; LEFT FEMUR 2 VIEWS COMPARISON:  12/07/2019 FINDINGS: A series of fluoroscopic spot images document placement of IM rod and sliding screw across intertrochanteric femur fracture. IMPRESSION: Internal fixation  of left intertrochanteric femur fracture. Electronically Signed   By: Corlis Leak M.D.   On: 12/09/2019 13:46      Scheduled Meds: . apixaban  2.5 mg Oral BID  . docusate sodium  100 mg Oral BID  . ezetimibe  10 mg Oral Daily  . ferrous sulfate  325 mg Oral TID PC  . furosemide  20 mg Oral Daily  . insulin aspart  0-20 Units Subcutaneous TID WC  . insulin aspart  0-5 Units Subcutaneous QHS  . insulin glargine  36 Units Subcutaneous QHS  . linagliptin  5 mg Oral Daily  . metFORMIN  500 mg Oral TID WC  . metoprolol succinate  25 mg Oral Daily  . mirabegron ER  50 mg Oral Daily  . pantoprazole  40 mg Oral Daily  . pravastatin  40 mg Oral QHS  . ranolazine  500 mg Oral BID   Continuous Infusions:   LOS: 4 days    Time spent: 25 minutes   Sharlene Dory, DO [facility] 12/11/2019, 12:31 PM   Available via Epic secure chat 7am-7pm After these hours, please refer to coverage provider listed on [email or web address]

## 2019-12-11 NOTE — TOC Initial Note (Signed)
Transition of Care [facility]) - Initial/Assessment Note    Patient Details  Name: Kelly Lee MRN: [id number] Date of Birth: [birth year]  Transition of [facility]) CM/SW Contact:    Geralynn Ochs, LCSW Phone Number: 12/11/2019, 3:09 PM  Clinical Narrative:    CSW met with patient to discuss SNF placement. Patient agreeable, but said she didn't know any places close to where she lives in [place]. CSW to fax out referral and follow up with options available for patient.                Expected Discharge Plan: Skilled Nursing Facility Barriers to Discharge: Continued Medical Work up, Ship broker   Patient Goals and CMS Choice Patient states their goals for this hospitalization and ongoing recovery are:: feel better, not be in so much pain CMS Medicare.gov Compare Post Acute Care list provided to:: Patient Choice offered to / list presented to : Patient  Expected Discharge Plan and Services Expected Discharge Plan: [place] Choice: [place] arrangements for the past 2 months: Single Family Home                                      Prior Living Arrangements/Services Living arrangements for the past 2 months: Single Family Home Lives with:: Self, Adult Children Patient language and need for interpreter reviewed:: No Do you feel safe going back to the place where you live?: Yes      Need for Family Participation in Patient Care: No (Comment) Care giver support system in place?: No (comment)   Criminal Activity/Legal Involvement Pertinent to Current Situation/Hospitalization: No - Comment as needed  Activities of Daily Living Home Assistive Devices/Equipment: Other (Comment) ADL Screening (condition at time of admission) Patient's cognitive ability adequate to safely complete daily activities?: Yes Is the patient deaf or have difficulty hearing?: No Does the patient have difficulty seeing, even  when wearing glasses/contacts?: No Does the patient have difficulty concentrating, remembering, or making decisions?: No Patient able to express need for assistance with ADLs?: Yes Does the patient have difficulty dressing or bathing?: Yes Independently performs ADLs?: No Does the patient have difficulty walking or climbing stairs?: Yes Weakness of Legs: Left Weakness of Arms/Hands: None  Permission Sought/Granted Permission sought to share information with : Chartered certified accountant granted to share information with : Yes, Verbal Permission Granted     Permission granted to share info w AGENCY: SNF        Emotional Assessment Appearance:: Appears stated age Attitude/Demeanor/Rapport: Engaged Affect (typically observed): Pleasant Orientation: : Oriented to Self, Oriented to Place, Oriented to  Time, Oriented to Situation Alcohol / Substance Use: Not Applicable Psych Involvement: No (comment)  Admission diagnosis:  Fall [W19.XXXA] Closed displaced intertrochanteric fracture of left femur, initial encounter ([place]) [S72.142A] S/p left hip fracture [Z87.81] Patient Active Problem List   Diagnosis Date Noted  . Anemia 12/11/2019  . Closed intertrochanteric fracture, left, initial encounter ([place]) 12/08/2019  . S/p left hip fracture 12/07/2019  . Diabetes mellitus without complication ([place])   . Hypertension   . Chronic respiratory failure with hypoxia ([place]) 09/26/2018  . Dyslipidemia 09/26/2018  . PAF (paroxysmal atrial fibrillation) ([place]) 09/26/2018  . Anticoagulant long-term use 02/16/2018  . 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 Morbid obesity with BMI of 40.0-44.9, adult ([place])  02/16/2018  . Typical atrial flutter ([place]) 02/15/2018  . Hypothyroidism 01/10/2018  . Type 2 diabetes mellitus, with long-term current use of insulin ([place]) 01/09/2018   PCP:  Theodosia Blender, PA-C Pharmacy:    [id number] [place], [id number]: [phone]: [phone]    Social Determinants of Health (SDOH) Interventions    Readmission Risk Interventions No flowsheet data found.

## 2019-12-11 NOTE — NC FL2 (Signed)
[place] LEVEL OF CARE SCREENING TOOL     IDENTIFICATION  Patient Name: Kelly Lee Birthdate: 1951/09/01 Sex: female Admission Date (Current Location): 12/07/2019  [facility] and [place] Number:  [employer] and Address:  [facility] [place], [place] [address] [place], [place] [address]      Provider Number: [id number]  Attending Physician Name and Address:  [name],*  Relative Name and Phone Number:       Current Level of Care: Hospital Recommended Level of Care: [place] Prior Approval Number:    Date Approved/Denied:   PASRR Number: [id number]  Discharge Plan: SNF    Current Diagnoses: Patient Active Problem List   Diagnosis Date Noted  . Anemia 12/11/2019  . Closed intertrochanteric fracture, left, initial encounter ([place]) 12/08/2019  . S/p left hip fracture 12/07/2019  . Diabetes mellitus without complication ([place])   . Hypertension   . Chronic respiratory failure with hypoxia ([place]) 09/26/2018  . Dyslipidemia 09/26/2018  . PAF (paroxysmal atrial fibrillation) ([place]) 09/26/2018  . Anticoagulant long-term use 02/16/2018  . 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 Morbid obesity with BMI of 40.0-44.9, adult ([place]) 02/16/2018  . Typical atrial flutter ([place]) 02/15/2018  . Hypothyroidism 01/10/2018  . Type 2 diabetes mellitus, with long-term current use of insulin ([place]) 01/09/2018    Orientation RESPIRATION BLADDER Height & Weight     Self, Time, Situation, Place  O2([place] 3L) Continent Weight: 205 lb (93 kg) Height:  5\' 1"  (154.9 cm)  BEHAVIORAL SYMPTOMS/MOOD NEUROLOGICAL BOWEL NUTRITION STATUS      Continent Diet(carb modified)  AMBULATORY STATUS COMMUNICATION OF NEEDS Skin   Limited Assist Verbally Surgical wounds(closed left hip, adhesive bandage)                       Personal Care Assistance Level of  Assistance  Bathing, Feeding, Dressing Bathing Assistance: Limited assistance Feeding assistance: Independent Dressing Assistance: Limited assistance     Functional Limitations Info             SPECIAL CARE FACTORS FREQUENCY  PT (By licensed PT), OT (By licensed OT)     PT Frequency: 5x/wk OT Frequency: 5x/wk            Contractures Contractures Info: Not present    Additional Factors Info  Code Status, Allergies, Insulin Sliding Scale Code Status Info: Full Allergies Info: Penicillins, Azithromycin, Drug Ingredient Cephalexin   Insulin Sliding Scale Info: 0-20 units 3x/day with meals; 0-5 units daily at bed; Lantus 36 units daily at bed       Current Medications (12/11/2019):  This is the current hospital active medication list Current Facility-Administered Medications  Medication Dose Route Frequency Provider Last Rate Last Admin  . acetaminophen (TYLENOL) tablet 325-650 mg  325-650 mg Oral Q6H PRN Maurice March, PA-C   650 mg at 12/10/19 2319  . docusate sodium (COLACE) capsule 100 mg  100 mg Oral BID Maurice March, PA-C   100 mg at 12/11/19 0277  . ezetimibe (ZETIA) tablet 10 mg  10 mg Oral Daily Truett Mainland, DO   10 mg at 12/10/19 2138  . ferrous sulfate tablet 325 mg  325 mg Oral TID PC Maurice March, PA-C   325 mg at 12/11/19 1324  . furosemide (LASIX) tablet 20 mg  20 mg Oral Daily Truett Mainland, DO   20 mg at 12/11/19 0947  .  HYDROcodone-acetaminophen (NORCO) 7.5-325 MG per tablet 1-2 tablet  1-2 tablet Oral Q4H PRN Tommie Ard, PA-C      . HYDROcodone-acetaminophen (NORCO/VICODIN) 5-325 MG per tablet 1-2 tablet  1-2 tablet Oral Q4H PRN Tommie Ard, PA-C   2 tablet at 12/10/19 1303  . insulin aspart (novoLOG) injection 0-20 Units  0-20 Units Subcutaneous TID WC Sharlene Dory, DO   11 Units at 12/11/19 1325  . insulin aspart (novoLOG) injection 0-5 Units  0-5 Units Subcutaneous QHS Levie Heritage, DO   3 Units at 12/10/19  2154  . insulin glargine (LANTUS) injection 36 Units  36 Units Subcutaneous QHS Sharlene Dory, [place]   36 Units at 12/10/19 2240  . linagliptin (TRADJENTA) tablet 5 mg  5 mg Oral Daily Levie Heritage, DO   5 mg at 12/11/19 0948  . menthol-cetylpyridinium (CEPACOL) lozenge 3 mg  1 lozenge Oral PRN Dennie Bible R, PA-C       Or  . phenol (CHLORASEPTIC) mouth spray 1 spray  1 spray Mouth/Throat PRN Tommie Ard, PA-C      . metFORMIN (GLUCOPHAGE) tablet 500 mg  500 mg Oral TID WC Sharlene Dory, DO   500 mg at 12/11/19 1324  . metoCLOPramide (REGLAN) tablet 5-10 mg  5-10 mg Oral Q8H PRN Dennie Bible R, PA-C       Or  . metoCLOPramide (REGLAN) injection 5-10 mg  5-10 mg Intravenous Q8H PRN Tommie Ard, PA-C      . metoprolol succinate (TOPROL-XL) 24 hr tablet 25 mg  25 mg Oral Daily Levie Heritage, DO   25 mg at 12/11/19 0947  . mirabegron ER (MYRBETRIQ) tablet 50 mg  50 mg Oral Daily Levie Heritage, DO   50 mg at 12/11/19 0947  . morphine 2 MG/ML injection 0.5-1 mg  0.5-1 mg Intravenous Q2H PRN Tommie Ard, PA-C      . ondansetron [facility]) tablet 4 mg  4 mg Oral Q6H PRN Tommie Ard, PA-C       Or  . ondansetron (ZOFRAN) injection 4 mg  4 mg Intravenous Q6H PRN Tommie Ard, PA-C      . pantoprazole (PROTONIX) EC tablet 40 mg  40 mg Oral Daily Levie Heritage, DO   40 mg at 12/11/19 0949  . polyethylene glycol (MIRALAX / GLYCOLAX) packet 17 g  17 g Oral Daily PRN Tommie Ard, PA-C      . pravastatin (PRAVACHOL) tablet 40 mg  40 mg Oral QHS Levie Heritage, DO   40 mg at 12/10/19 2138  . ranolazine (RANEXA) 12 hr tablet 500 mg  500 mg Oral BID Levie Heritage, DO   500 mg at 12/11/19 0947  . tiZANidine (ZANAFLEX) tablet 4 mg  4 mg Oral TID PRN Tommie Ard, PA-C   4 mg at 12/10/19 1303     Discharge Medications: Please see discharge summary for a list of discharge medications.  Relevant Imaging Results:  Relevant Lab  Results:   Additional Information SS#: [id number]  Baldemar Lenis, LCSW

## 2019-12-11 NOTE — Progress Notes (Addendum)
CRITICAL VALUE STICKER  CRITICAL VALUE: Hemoglobin 5.3  RECEIVER (on-site recipient of call): Suzan Slick  DATE & TIME NOTIFIED: 12/11/19  MESSENGER (representative from lab): Jodelle Red  MD NOTIFIED: On call Craige Cotta   TIME OF NOTIFICATION: 0140  RESPONSE: Transfuse blood

## 2019-12-11 NOTE — Progress Notes (Signed)
Physical Therapy Treatment Patient Details Name: Kelly Lee MRN: [id number] DOB: 06-09-1951 Today's Date: 12/11/2019    History of Present Illness Pt is a 68 y/o female admitted after falling at home in which she sustained a complex, comminuted peri-trochanteric L hip fracture and R shoulder contusion. Pt is now s/p IM nail. PMH including but not limited to a-fib, CHF, DM and HTN.    PT Comments    Pt making slow progress with functional mobility. She remains greatly limited secondary to L LE pain and weakness. Pt upset regarding events of the day (I.e. - needing units of blood, chest pain while receiving blood, pain with IV site, increasing L LE pain). Pt with no complaints of chest pain throughout session. Pt would continue to benefit from skilled physical therapy services at this time while admitted and after d/c to address the below listed limitations in order to improve overall safety and independence with functional mobility.    Follow Up Recommendations  SNF     Equipment Recommendations  None recommended by PT    Recommendations for Other Services       Precautions / Restrictions Precautions Precautions: Fall Restrictions Weight Bearing Restrictions: Yes LLE Weight Bearing: Partial weight bearing LLE Partial Weight Bearing Percentage or Pounds: 50%    Mobility  Bed Mobility Overal bed mobility: Needs Assistance Bed Mobility: Supine to Sit;Sit to Supine     Supine to sit: Mod assist Sit to supine: Mod assist   General bed mobility comments: greatly increased time needed as pt very anxious about moving her L LE at all. Pt able to manage trunk and upper body throughout with use of bed features. She required assistance for L LE movement and slight trunk elevation  Transfers                 General transfer comment: pt deferring secondary to pain  Ambulation/Gait                 Stairs             Wheelchair Mobility    Modified Rankin  (Stroke Patients Only)       Balance Overall balance assessment: Needs assistance;History of Falls Sitting-balance support: No upper extremity supported Sitting balance-Leahy Scale: Fair                                      Cognition Arousal/Alertness: Awake/alert Behavior During Therapy: Anxious Overall Cognitive Status: Within Functional Limits for tasks assessed                                        Exercises General Exercises - Lower Extremity Ankle Circles/Pumps: AROM;Left;10 reps;Seated Long Arc Quad: AAROM;Left;20 reps;Seated    General Comments        Pertinent Vitals/Pain Pain Assessment: Faces Faces Pain Scale: Hurts whole lot Pain Location: L hip and knee Pain Descriptors / Indicators: Grimacing;Guarding Pain Intervention(s): Monitored during session;Repositioned    Home Living                      Prior Function            PT Goals (current goals can now be found in the care plan section) Acute Rehab PT Goals PT Goal Formulation: With patient Time For Goal Achievement:  12/24/19 Potential to Achieve Goals: Good Progress towards PT goals: Progressing toward goals    Frequency    Min 3X/week      PT Plan Current plan remains appropriate    Co-evaluation              AM-PAC PT "6 Clicks" Mobility   Outcome Measure  Help needed turning from your back to your side while in a flat bed without using bedrails?: A Lot Help needed moving from lying on your back to sitting on the side of a flat bed without using bedrails?: A Lot Help needed moving to and from a bed to a chair (including a wheelchair)?: A Lot Help needed standing up from a chair using your arms (e.g., wheelchair or bedside chair)?: A Lot Help needed to walk in hospital room?: Total Help needed climbing 3-5 steps with a railing? : Total 6 Click Score: 10    End of Session   Activity Tolerance: Patient limited by pain Patient left: in  bed;with call bell/phone within reach Nurse Communication: Mobility status PT Visit Diagnosis: Other abnormalities of gait and mobility (R26.89);Pain Pain - Right/Left: Left Pain - part of body: Hip;Knee     Time: 2229-7989 PT Time Calculation (min) (ACUTE ONLY): 28 min  Charges:  $Therapeutic Activity: 23-37 mins                     Arletta Bale, DPT  Acute Rehabilitation Services Pager [phone] Office [phone]     [name] 12/11/2019, 2:18 PM

## 2019-12-11 NOTE — Progress Notes (Signed)
  Progress Note   Date: 12/11/2019  Patient Name: Kelly Lee        MRN#: [id number]  Clarification of diagnosis:  Post-operative (acute) blood loss anemia is most likely explanation, other causes of bleeding are being investigated.

## 2019-12-11 NOTE — Progress Notes (Addendum)
Patient getting 2nd unit of blood, began at 825, called in at 1020, stated she was having left sided chest pain, no precipating factors, patient is being monitored by central telemetry.  No other s/s, no sweating, no radiating to arms/back, no n/v.  Patient stated this happens from time to time and she takes 4 81 mg baby aspirin.  Dr. Otto Herb notified, blood was stopped, IV site flushed and EKG completed, no changes, NSR w/PVC.  IV access began leaking, d/c iv access and attempted to start new IV, patient refused, explained d/t diagnosis, however, patient still refused.  Dr. Carmelia Roller aware of no IV access.  VSS.  Will repeat VS Q1h x2.  Chest pain had let up while at bedside.  Patient stated this is normal for her.   1130:  No chest pain at this time, respirs easy and unlabored, Patient appears comfortable and in NAD at this time.  Will continue to monitor.   1200:  No changes to patient, continues to remain chest pain free.  VSS.  Will continue to monitor

## 2019-12-11 NOTE — Progress Notes (Signed)
Inpatient Diabetes Program Recommendations  AACE/ADA: New Consensus Statement on Inpatient Glycemic Control (2015)  Target Ranges:  Prepandial:   less than 140 mg/dL      Peak postprandial:   less than 180 mg/dL (1-2 hours)      Critically ill patients:  140 - 180 mg/dL   Lab Results  Component Value Date   GLUCAP 285 (H) 12/11/2019   HGBA1C 7.5 (H) 12/07/2019    Review of Glycemic Control Results for HAIVYN, ORAVEC (MRN [id number]) as of 12/11/2019 14:52  Ref. Range 12/10/2019 11:54 12/10/2019 16:21 12/10/2019 21:43 12/11/2019 06:37 12/11/2019 11:22  Glucose-Capillary Latest Ref Range: 70 - 99 mg/dL 234 (H) 144 (H) 360 (H) 289 (H) 285 (H)  Diabetes history:Type 2 DM Outpatient Diabetes medications:Januvia 100 mg QD, Metformin 500 mg TID, Lantus 33 units QHS Current orders for Inpatient glycemic control:Lantus 33 units QHS, Tradjenta 5 mg QD, Novolog 0-20 units TID, Novolog 0-5 units QHS Inpatient Diabetes Program Recommendations:    If blood sugars remain > goal, consider adding Novolog 4 units tid with meals (hold if patient eats less than 50%).   Thanks  Beryl Meager, RN, BC-ADM Inpatient Diabetes Coordinator Pager [phone] (8a-5p)

## 2019-12-12 LAB — TYPE AND SCREEN
ABO/RH(D): O POS
Antibody Screen: NEGATIVE
Unit division: 0
Unit division: 0

## 2019-12-12 LAB — BASIC METABOLIC PANEL WITH GFR
Anion gap: 8 (ref 5–15)
BUN: 18 mg/dL (ref 8–23)
CO2: 29 mmol/L (ref 22–32)
Calcium: 8.8 mg/dL — ABNORMAL LOW (ref 8.9–10.3)
Chloride: 99 mmol/L (ref 98–111)
Creatinine, Ser: 0.64 mg/dL (ref 0.44–1.00)
GFR calc Af Amer: >60 mL/min
GFR calc non Af Amer: >60 mL/min
Glucose, Bld: 196 mg/dL — ABNORMAL HIGH (ref 70–99)
Potassium: 4.3 mmol/L (ref 3.5–5.1)
Sodium: 136 mmol/L (ref 135–145)

## 2019-12-12 LAB — BPAM RBC
Blood Product Expiration Date: 202102252359
Blood Product Expiration Date: 202102252359
ISSUE DATE / TIME: 202101280346
ISSUE DATE / TIME: 202101280846
Unit Type and Rh: 5100
Unit Type and Rh: 5100

## 2019-12-12 LAB — CBC
HCT: 24.3 % — ABNORMAL LOW (ref 36.0–46.0)
Hemoglobin: 7.9 g/dL — ABNORMAL LOW (ref 12.0–15.0)
MCH: 28.1 pg (ref 26.0–34.0)
MCHC: 32.5 g/dL (ref 30.0–36.0)
MCV: 86.5 fL (ref 80.0–100.0)
Platelets: 238 10*3/uL (ref 150–400)
RBC: 2.81 MIL/uL — ABNORMAL LOW (ref 3.87–5.11)
RDW: 15.6 % — ABNORMAL HIGH (ref 11.5–15.5)
WBC: 11.5 10*3/uL — ABNORMAL HIGH (ref 4.0–10.5)
nRBC: 0.5 % — ABNORMAL HIGH (ref 0.0–0.2)

## 2019-12-12 LAB — GLUCOSE, CAPILLARY
Glucose-Capillary: 209 mg/dL — ABNORMAL HIGH (ref 70–99)
Glucose-Capillary: 230 mg/dL — ABNORMAL HIGH (ref 70–99)
Glucose-Capillary: 202 mg/dL — ABNORMAL HIGH (ref 70–99)
Glucose-Capillary: 146 mg/dL — ABNORMAL HIGH (ref 70–99)

## 2019-12-12 LAB — SARS CORONAVIRUS 2 (TAT 6-24 HRS): SARS Coronavirus 2: NEGATIVE

## 2019-12-12 MED ORDER — INSULIN GLARGINE 100 UNIT/ML ~~LOC~~ SOLN
8.0000 [IU] | Freq: Once | SUBCUTANEOUS | Status: AC
  Administered 2019-12-12: 8 [IU] via SUBCUTANEOUS
  Filled 2019-12-12: qty 0.08

## 2019-12-12 MED ORDER — INSULIN GLARGINE 100 UNIT/ML ~~LOC~~ SOLN
40.0000 [IU] | Freq: Every day | SUBCUTANEOUS | Status: DC
  Administered 2019-12-12 – 2019-12-16 (×5): 40 [IU] via SUBCUTANEOUS
  Filled 2019-12-12 (×6): qty 0.4

## 2019-12-12 NOTE — TOC Progression Note (Addendum)
Transition of Care [facility]) - Progression Note    Patient Details  Name: Kelly Lee MRN: [id number] Date of Birth: 07/25/1951  Transition [facility]) CM/SW Contact  Doy Hutching, [place] Phone Number: 12/12/2019, 1:42 PM  Clinical Narrative: 3:23pm- CSW spoke with pt daughter Dorene. She has information for SNF offers and will be at hospital this evening to discuss options with pt.     2:55pm- CSW has attempted to reach pt daughter Dorene at [phone]; no answer, no voicemail box set up. Sent HIPAA compliant text message.  1:42pm- CSW spoke with pt at bedside. Provided her with offers and packet with CMS ratings. Pt still in agreement with SNF; she understands there are few near her in [place]. She is from home with her daughter and says it is okay for CSW to give her a call. She wants a SNF that hasnt had many COVID cases. We discussed that there is not facilities that have not had COVID but that the best thing to do is for her to wear her mask here and at SNF to lessen exposure. Also shared that numbers for infections can be found online.   CSW will f/u with pt daughter to discuss choices. Pt will need new COVID test 24-48 hrs prior to discharge.    Expected Discharge Plan: Skilled Nursing Facility Barriers to Discharge: Continued Medical Work up, English as a second language teacher  Expected Discharge Plan and Services Expected Discharge Plan: Skilled Nursing Facility  Post Acute Care Choice: Skilled Nursing Facility Living arrangements for the past 2 months: Single Family Home  Readmission Risk Interventions Readmission Risk Prevention Plan 12/12/2019  Transportation Screening Complete  PCP or Specialist Appt within 5-7 Days Not Complete  Not Complete comments plan for SNF  Home Care Screening Complete  Medication Review (RN CM) Referral to Pharmacy

## 2019-12-12 NOTE — Progress Notes (Signed)
PROGRESS NOTE    Kelly Lee  RJJ:[id number] DOB: 04/03/1951 DOA: 12/07/2019 PCP: Oval Linsey, PA-C     Brief Narrative:  Patient is a 69 year old female with a history of atrial fibrillation/flutter on Eliquis, hypertension, diabetes, and CHF. She had a fall at home on 1/24 and was admitted for a left trochanteric fracture.  On 12/09/2019, she had surgery on her hip.  She recovered well from a pain standpoint.  On the night of 1/27 into 1/28, the patient was found to have a hemoglobin of 5.3.  She received 1.5 units of blood and hemoglobin resume nicely.  No blood in her stool or urine.   New events last 24 hours / Subjective: Ortho has signed off as she is progressing well from their standpoint.  She reports diet is fair, doing well overall.  No complaints.  Waiting on SNF placement.  Assessment & Plan:  Principal Problem: Closed intertrochanteric fracture, left, initial encounter [facility]) Appreciate ortho, she is good to go to SNF from their standpoint S/p ORIF on 1/26 Norco 7.5-325 q 4 hrs prn severe pain Norco 5-325 mg q 4 hrs prn moderate pain Tylenol prn mild pain Zanaflex 4 mg TID prn PT  24 hr covid test ordered for SNF placement   Anemia             S/p 1.5 units PRBCs             Monitor CBC             Transfuse as needed             Change Eliquis to SCDs  Hypertension Continue metoprolol XR 25 mg daily  Anticoagulant long-term use Resuming Eliquis 2.5 mg bid  Chronic respiratory failure with hypoxia (HCC) Continue oxygen, at home levels right now  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 Continue pravastatin 40 mg daily Continue Ranexa 500 mg twice daily  OAB Continue Myrbetriq 50 mg daily  Morbid obesity with BMI of  40.0-44.9, adult (HCC)  PAF (paroxysmal atrial fibrillation) (HCC) Continue metoprololXL 25 mg/d             Hold Eliquis given recent drop in Hb  Type 2 diabetes mellitus, with long-term current use of insulin (HCC) Sliding scale insulin Basal bolus insulin Tradjenta 5 mg daily             Add back metformin 500 mg tid given renal function Low-carb diet when able to eat  Dyslipidemia Zetia 10 mg daily  DVT prophylaxis: SCDs Code Status: Full        Family Communication: Self Disposition Plan: SNF; coming from home; barriers to d/c include bed availability, required negative Covid test  Consultants:   Ortho  Procedures:   ORIF 1/26  Blood transfusion 1/28   Antimicrobials:  Anti-infectives (From admission, onward)   Start     Dose/Rate Route Frequency Ordered Stop   12/09/19 1445  vancomycin (VANCOCIN) IVPB 1000 mg/200 mL premix     1,000 mg 200 mL/hr over 60 Minutes Intravenous Every 12 hours 12/09/19 1442 12/09/19 1800        Objective: Vitals:   12/12/19 0348 12/12/19 0755 12/12/19 0900 12/12/19 1345  BP: (!) 93/59 (!) 98/54 (!) 98/54 (!) 98/51  Pulse: 83 76 82 74  Resp: 15 16  17   Temp: 98.7 F (37.1 C) 97.6 F (36.4 C)  (!) 97.5 F (36.4 C)  TempSrc:  Oral  Oral  SpO2: 100%  100%  100%  Weight:      Height:        Intake/Output Summary (Last 24 hours) at 12/12/2019 1417 Last data filed at 12/12/2019 1100 Gross per 24 hour  Intake 480 ml  Output 0 ml  Net 480 ml   Filed Weights   12/07/19 1504 12/09/19 1117  Weight: 93 kg 93 kg    Examination: General exam: Appears calm and comfortable  Respiratory system: Clear to auscultation. Respiratory effort normal. No respiratory distress. No conversational dyspnea.  Cardiovascular system: S1 & S2 heard, RRR. No murmurs. No pedal edema. Gastrointestinal system: Abdomen is nondistended, soft and mild abdominal  discomfort diffusely with palpation. Normal bowel sounds heard. Central nervous system: Alert and oriented. No focal neurological deficits. Speech clear.  Extremities: Symmetric in appearance  Skin: No rashes, lesions or ulcers on exposed skin  Psychiatry: Judgement and insight appear normal. Mood & affect appropriate.   Data Reviewed: I have personally reviewed following labs and imaging studies  CBC: Recent Labs  Lab 12/07/19 1532 12/08/19 0302 12/09/19 0255 12/10/19 0222 12/11/19 0117 12/11/19 1531 12/12/19 0305  WBC 11.2*   < > 10.6* 10.4 9.5 13.3* 11.5*  NEUTROABS 8.4*  --   --   --   --   --   --   HGB 11.6*   < > 9.0* 7.1* 5.3* 8.6* 7.9*  HCT 36.2   < > 28.3* 22.5* 16.3* 26.1* 24.3*  MCV 90.3   < > 90.7 88.9 89.6 86.1 86.5  PLT 245   < > 183 173 161 234 238   < > = values in this interval not displayed.   Basic Metabolic Panel: Recent Labs  Lab 12/08/19 0302 12/09/19 0255 12/10/19 0222 12/11/19 0117 12/12/19 0305  NA 135 135 134* 131* 136  K 4.6 4.5 4.6 4.6 4.3  CL 98 99 99 99 99  CO2 26 29 27 26 29   GLUCOSE 296* 169* 285* 288* 196*  BUN 12 16 12 20 18   CREATININE 0.92 0.82 0.82 0.80 0.64  CALCIUM 8.9 8.7* 8.8* 8.1* 8.8*  MG  --   --   --  1.7  --    GFR: Estimated Creatinine Clearance: 69 mL/min (by C-G formula based on SCr of 0.64 mg/dL). Liver Function Tests: Recent Labs  Lab 12/07/19 1532  AST 16  ALT 12  ALKPHOS 52  BILITOT 0.8  PROT 6.7  ALBUMIN 3.4*   CBG: Recent Labs  Lab 12/11/19 1122 12/11/19 1605 12/11/19 2034 12/12/19 0638 12/12/19 1102  GLUCAP 285* 112* 191* 209* 230*    Recent Results (from the past 240 hour(s))  Respiratory Panel by RT PCR (Flu A&B, Covid) - Nasopharyngeal Swab     Status: None   Collection Time: 12/07/19  4:53 PM   Specimen: Nasopharyngeal Swab  Result Value Ref Range Status   SARS Coronavirus 2 by RT PCR NEGATIVE NEGATIVE Final    Comment: (NOTE) SARS-CoV-2 target nucleic acids are NOT DETECTED. The  SARS-CoV-2 RNA is generally detectable in upper respiratoy specimens during the acute phase of infection. The lowest concentration of SARS-CoV-2 viral copies this assay can detect is 131 copies/mL. A negative result does not preclude SARS-Cov-2 infection and should not be used as the sole basis for treatment or other patient management decisions. A negative result may occur with  improper specimen collection/handling, submission of specimen other than nasopharyngeal swab, presence of viral mutation(s) within the areas targeted by this assay, and inadequate number of viral copies (<  131 copies/mL). A negative result must be combined with clinical observations, patient history, and epidemiological information. The expected result is Negative. Fact Sheet for Patients:  PinkCheek.be Fact Sheet for Healthcare Providers:  GravelBags.it This test is not yet ap proved or cleared by the [place] FDA and  has been authorized for detection and/or diagnosis of SARS-CoV-2 by FDA under an Emergency Use Authorization (EUA). This EUA will remain  in effect (meaning this test can be used) for the duration of the COVID-19 declaration under Section 564(b)(1) of the Act, 21 U.S.C. section 360bbb-3(b)(1), unless the authorization is terminated or revoked sooner.    Influenza A by PCR NEGATIVE NEGATIVE Final   Influenza B by PCR NEGATIVE NEGATIVE Final    Comment: (NOTE) The Xpert Xpress SARS-CoV-2/FLU/RSV assay is intended as an aid in  the diagnosis of influenza from Nasopharyngeal swab specimens and  should not be used as a sole basis for treatment. Nasal washings and  aspirates are unacceptable for Xpert Xpress SARS-CoV-2/FLU/RSV  testing. Fact Sheet for Patients: PinkCheek.be Fact Sheet for Healthcare Providers: GravelBags.it This test is not yet approved or cleared by the [place] FDA and  has been authorized for detection and/or diagnosis of SARS-CoV-2 by  FDA under an Emergency Use Authorization (EUA). This EUA will remain  in effect (meaning this test can be used) for the duration of the  Covid-19 declaration under Section 564(b)(1) of the Act, 21  U.S.C. section 360bbb-3(b)(1), unless the authorization is  terminated or revoked. Performed at [facility], [place] [address], [place], [place] [address]   Surgical pcr screen     Status: None   Collection Time: 12/08/19  2:22 AM   Specimen: Nasal Mucosa; Nasal Swab  Result Value Ref Range Status   MRSA, PCR NEGATIVE NEGATIVE Final   Staphylococcus aureus NEGATIVE NEGATIVE Final    Comment: (NOTE) The Xpert SA Assay (FDA approved for NASAL specimens in patients 23 years of age and older), is one component of a comprehensive surveillance program. It is not intended to diagnose infection nor to guide or monitor treatment. Performed at [facility], [place] [address], [place],  [address]       Radiology Studies: DG Knee Complete 4 Views Left  Result Date: 12/10/2019 CLINICAL DATA:  Left knee pain. The patient suffered a left hip fracture in a fall 12/07/2019. Initial encounter. EXAM: LEFT KNEE - COMPLETE 4+ VIEW COMPARISON:  None. FINDINGS: There is no acute bony or joint abnormality. The patient has advanced osteoarthritis about the knee which appears worst in the patellofemoral compartment. Chondrocalcinosis is seen. No joint effusion. Distal end of an intramedullary nail in the femur and vascular clips in the medial soft tissues of the lower leg noted. IMPRESSION: No acute abnormality. Advanced tricompartmental osteoarthritis. Electronically Signed   By: Inge Rise M.D.   On: 12/10/2019 15:40      Scheduled Meds: . docusate sodium  100 mg Oral BID  . ezetimibe  10 mg Oral Daily  . ferrous sulfate  325 mg Oral TID PC  . furosemide  20 mg Oral Daily  . insulin aspart  0-20 Units  Subcutaneous TID WC  . insulin aspart  0-5 Units Subcutaneous QHS  . insulin glargine  40 Units Subcutaneous QHS  . linagliptin  5 mg Oral Daily  . metFORMIN  500 mg Oral TID WC  . metoprolol succinate  25 mg Oral Daily  . mirabegron ER  50 mg Oral Daily  .  pantoprazole  40 mg Oral Daily  . pravastatin  40 mg Oral QHS  . ranolazine  500 mg Oral BID   Continuous Infusions:   LOS: 5 days    Time spent: 15 minutes   Sharlene Dory, DO [facility] 12/12/2019, 2:17 PM   Available via Epic secure chat 7am-7pm After these hours, please refer to coverage provider listed on [email or web address]

## 2019-12-12 NOTE — Plan of Care (Signed)
  Problem: Pain Management: Goal: Pain level will decrease Outcome: Progressing   Problem: Clinical Measurements: Goal: Postoperative complications will be avoided or minimized Outcome: Progressing   Problem: Activity: Goal: Ability to ambulate and perform ADLs will improve Outcome: Progressing   Problem: Safety: Goal: Ability to remain free from injury will improve Outcome: Progressing   Problem: Skin Integrity: Goal: Risk for impaired skin integrity will decrease Outcome: Progressing   Problem: Elimination: Goal: Will not experience complications related to bowel motility Outcome: Progressing

## 2019-12-12 NOTE — Progress Notes (Signed)
Physical Therapy Treatment Patient Details Name: Kelly Lee MRN: [id number] DOB: 1951-01-07 Today's Date: 12/12/2019    History of Present Illness Pt is a 69 y/o female admitted after falling at home in which she sustained a complex, comminuted peri-trochanteric L hip fracture and R shoulder contusion. Pt is now s/p IM nail. PMH including but not limited to a-fib, CHF, DM and HTN.    PT Comments    Pt remains incredibly limited secondary to pain and L LE weakness. Pt moving very slowly with everything, but truly attempting to move her L LE as much as she can. Pt would continue to benefit from skilled physical therapy services at this time while admitted and after d/c to address the below listed limitations in order to improve overall safety and independence with functional mobility.    Follow Up Recommendations  SNF     Equipment Recommendations  None recommended by PT    Recommendations for Other Services       Precautions / Restrictions Precautions Precautions: Fall Restrictions Weight Bearing Restrictions: Yes LLE Weight Bearing: Partial weight bearing LLE Partial Weight Bearing Percentage or Pounds: 50%    Mobility  Bed Mobility Overal bed mobility: Needs Assistance Bed Mobility: Supine to Sit;Sit to Supine     Supine to sit: Mod assist Sit to supine: Mod assist   General bed mobility comments: greatly increased time needed as pt very anxious about moving her L LE at all. Pt able to manage trunk and upper body throughout with use of bed features. She required assistance only for L LE movement  Transfers                 General transfer comment: pt deferring secondary to pain  Ambulation/Gait                 Stairs             Wheelchair Mobility    Modified Rankin (Stroke Patients Only)       Balance Overall balance assessment: Needs assistance;History of Falls Sitting-balance support: No upper extremity supported Sitting  balance-Leahy Scale: Fair                                      Cognition Arousal/Alertness: Awake/alert Behavior During Therapy: Anxious Overall Cognitive Status: Within Functional Limits for tasks assessed                                        Exercises General Exercises - Lower Extremity Quad Sets: AROM;Strengthening;Left;10 reps;Supine Short Arc Quad: AAROM;Left;10 reps;Supine Heel Slides: AAROM;Left;10 reps;Supine    General Comments        Pertinent Vitals/Pain Pain Assessment: Faces Faces Pain Scale: Hurts even more Pain Location: L hip and knee Pain Descriptors / Indicators: Grimacing;Guarding Pain Intervention(s): Monitored during session;Repositioned    Home Living                      Prior Function            PT Goals (current goals can now be found in the care plan section) Acute Rehab PT Goals PT Goal Formulation: With patient Time For Goal Achievement: 12/24/19 Potential to Achieve Goals: Good Progress towards PT goals: Progressing toward goals    Frequency    Min 3X/week  PT Plan Current plan remains appropriate    Co-evaluation              AM-PAC PT "6 Clicks" Mobility   Outcome Measure  Help needed turning from your back to your side while in a flat bed without using bedrails?: A Lot Help needed moving from lying on your back to sitting on the side of a flat bed without using bedrails?: A Lot Help needed moving to and from a bed to a chair (including a wheelchair)?: A Lot Help needed standing up from a chair using your arms (e.g., wheelchair or bedside chair)?: A Lot Help needed to walk in hospital room?: Total Help needed climbing 3-5 steps with a railing? : Total 6 Click Score: 10    End of Session   Activity Tolerance: Patient limited by pain Patient left: in bed;with call bell/phone within reach Nurse Communication: Mobility status PT Visit Diagnosis: Other abnormalities of  gait and mobility (R26.89);Pain Pain - Right/Left: Left Pain - part of body: Hip;Knee     Time: 6226-3335 PT Time Calculation (min) (ACUTE ONLY): 38 min  Charges:  $Therapeutic Exercise: 8-22 mins $Therapeutic Activity: 23-37 mins                     Anastasio Champion, DPT  Acute Rehabilitation Services Pager [phone] Office [place] 12/12/2019, 3:13 PM

## 2019-12-13 LAB — CBC
HCT: 25.4 % — ABNORMAL LOW (ref 36.0–46.0)
Hemoglobin: 8.3 g/dL — ABNORMAL LOW (ref 12.0–15.0)
MCH: 28.6 pg (ref 26.0–34.0)
MCHC: 32.7 g/dL (ref 30.0–36.0)
MCV: 87.6 fL (ref 80.0–100.0)
Platelets: 263 10*3/uL (ref 150–400)
RBC: 2.9 MIL/uL — ABNORMAL LOW (ref 3.87–5.11)
RDW: 15.3 % (ref 11.5–15.5)
WBC: 10.7 10*3/uL — ABNORMAL HIGH (ref 4.0–10.5)
nRBC: 0.2 % (ref 0.0–0.2)

## 2019-12-13 LAB — BASIC METABOLIC PANEL
Anion gap: 10 (ref 5–15)
BUN: 16 mg/dL (ref 8–23)
CO2: 28 mmol/L (ref 22–32)
Calcium: 8.8 mg/dL — ABNORMAL LOW (ref 8.9–10.3)
Chloride: 97 mmol/L — ABNORMAL LOW (ref 98–111)
Creatinine, Ser: 0.68 mg/dL (ref 0.44–1.00)
GFR calc Af Amer: >60 mL/min (ref >60)
GFR calc non Af Amer: >60 mL/min (ref >60)
Glucose, Bld: 106 mg/dL — ABNORMAL HIGH (ref 70–99)
Potassium: 4.1 mmol/L (ref 3.5–5.1)
Sodium: 135 mmol/L (ref 135–145)

## 2019-12-13 LAB — GLUCOSE, CAPILLARY
Glucose-Capillary: 134 mg/dL — ABNORMAL HIGH (ref 70–99)
Glucose-Capillary: 291 mg/dL — ABNORMAL HIGH (ref 70–99)
Glucose-Capillary: 141 mg/dL — ABNORMAL HIGH (ref 70–99)
Glucose-Capillary: 119 mg/dL — ABNORMAL HIGH (ref 70–99)

## 2019-12-13 NOTE — Progress Notes (Signed)
Orthopedics Progress Note  Subjective: Stable overnight. Some pain with mobilization but she feels she is improving slowly  Objective:  Vitals:   12/12/19 2054 12/13/19 0403  BP: 115/67 (!) 108/49  Pulse: 84 65  Resp: 15 18  Temp: 98.3 F (36.8 C) 97.8 F (36.6 C)  SpO2: 98% 95%    General: Awake and alert  Musculoskeletal: left hip incisions covered with aquacel dressings . Mild swelling noted. No pain with ankle pumps Neurovascularly intact  Lab Results  Component Value Date   WBC 10.7 (H) 12/13/2019   HGB 8.3 (L) 12/13/2019   HCT 25.4 (L) 12/13/2019   MCV 87.6 12/13/2019   PLT 263 12/13/2019       Component Value Date/Time   NA 135 12/13/2019 0510   K 4.1 12/13/2019 0510   CL 97 (L) 12/13/2019 0510   CO2 28 12/13/2019 0510   GLUCOSE 106 (H) 12/13/2019 0510   BUN 16 12/13/2019 0510   CREATININE 0.68 12/13/2019 0510   CALCIUM 8.8 (L) 12/13/2019 0510   GFRNONAA >60 12/13/2019 0510   GFRAA >60 12/13/2019 0510    No results found for: INR, PROTIME  Assessment/Plan:  s/p Procedure(s): INTRAMEDULLARY (IM) NAIL FEMORAL Stable from ortho standpoint.  Continue therapy and discharge planning  Almedia Balls. Ranell Patrick, MD 12/13/2019 9:44 AM

## 2019-12-13 NOTE — TOC Progression Note (Signed)
Transition of Care [facility]) - Progression Note    Patient Details  Name: Saraya Tirey MRN: [id number] Date of Birth: [birth year]  Transition of Care [facility]) CM/SW [place], [employer] Phone Number: [phone] 12/13/2019, 12:05 PM  Clinical Narrative:     CSW met with patient about bed offers. Patient said that she wanted to talk to her daughter about the facilities and do her own research.It was agreed that  CSW will check in for bed choice tomorrow.  CSW will continue to follow for discharge planning needs.    Expected Discharge Plan: [place] Barriers to Discharge: Continued Medical Work up, Ship broker  Expected Discharge Plan and Services Expected Discharge Plan: [place] Choice: [place] arrangements for the past 2 months: Single Family Home                                       Social Determinants of Health (SDOH) Interventions    Readmission Risk Interventions Readmission Risk Prevention Plan 12/12/2019  Transportation Screening Complete  PCP or Specialist Appt within 5-7 Days Not Complete  Not Complete comments plan for SNF  Home Care Screening Complete  Medication Review (RN CM) Referral to Pharmacy

## 2019-12-13 NOTE — Plan of Care (Signed)
  Problem: Pain Managment: Goal: General experience of comfort will improve Outcome: Progressing   

## 2019-12-13 NOTE — Progress Notes (Signed)
PROGRESS NOTE    Kelly Lee  TKW:[id number] DOB: August 01, 1951 DOA: 12/07/2019 PCP: Oval Linsey, PA-C     Brief Narrative:  Patient is a 69 year old female with history of atrial fibrillation/flutter on Eliquis, hypertension, diabetes, and CHF. She had a fall at home on 12/07/19 and was admitted for a left trochanteric fracture.  On 12/09/2019, she had surgery on her hip.  She recovered well from a pain standpoint.  On the night of 1/27 into 1/28, the patient was found to have a hemoglobin of 5.3.  Patient was transfused with 1.5 units of packed red blood cells.  Posttransfusion hemoglobin has remained stable.  12/13/2019: Patient seen.  No new complaints.  Pain is controlled.  Blood pressure and blood sugar are reasonably controlled.  No fever or chills.  No shortness of breath or chest pain.  Patient is stable from medical point of view.  Patient is awaiting discharge to skilled nursing facility.  Assessment & Plan:  Principal Problem: Closed intertrochanteric fracture, left, initial encounter (HCC) Appreciate ortho, she is good to go to SNF from their standpoint S/p ORIF on 1/26 Norco 7.5-325 q 4 hrs prn severe pain Norco 5-325 mg q 4 hrs prn moderate pain Tylenol prn mild pain Zanaflex 4 mg TID prn PT  24 hr covid test ordered for SNF placement   Anemia             S/p transfusion of 1.5 units PRBCs             H/H has remained stable.     Continue to monitor H/H closely.  Drop in H/H is most likely postsurgical.             Transfuse as needed             Receiving Eliquis once okay with surgical team.  Hypertension Continue metoprolol XR 25 mg daily  Blood pressure is reasonably controlled.  Chronic respiratory failure with hypoxia (HCC) Continue oxygen, at home levels right now  Stable for now.  Coronary artery disease involving nonautologous  biological coronary bypass graft without angina pectoris Continue pravastatin 40 mg daily Continue Ranexa 500 mg twice daily  No symptoms.  OAB Continue Myrbetriq 50 mg daily  Morbid obesity with BMI of 40.0-44.9, adult (HCC)  PAF (paroxysmal atrial fibrillation) (HCC) Continue metoprololXL 25 mg/d             Resume Eliquis once okay with orthopedic team.  Type 2 diabetes mellitus, with long-term current use of insulin (HCC) Sliding scale insulin Basal bolus insulin Tradjenta 5 mg daily             Add back metformin 500 mg tid given renal function Blood sugars reasonably controlled.  Dyslipidemia Zetia 10 mg daily  DVT prophylaxis: SCDs Code Status: Full        Family Communication: Self Disposition Plan: SNF; coming from home; barriers to d/c include bed availability, required negative Covid test  Consultants:   Ortho  Procedures:   ORIF 1/26  Blood transfusion 1/28   Antimicrobials:  Anti-infectives (From admission, onward)   Start     Dose/Rate Route Frequency Ordered Stop   12/09/19 1445  vancomycin (VANCOCIN) IVPB 1000 mg/200 mL premix     1,000 mg 200 mL/hr over 60 Minutes Intravenous Every 12 hours 12/09/19 1442 12/09/19 1800       Objective: Vitals:   12/12/19 2054 12/13/19 0403 12/13/19 1050 12/13/19 1305  BP: 115/67 (!) 108/49 128/68 110/61  Pulse: 84  65 80 82  Resp: 15 18 18 17   Temp: 98.3 F (36.8 C) 97.8 F (36.6 C) 98.7 F (37.1 C) 98.5 F (36.9 C)  TempSrc:   Oral Oral  SpO2: 98% 95% 96% 99%  Weight:      Height:        Intake/Output Summary (Last 24 hours) at 12/13/2019 1458 Last data filed at 12/13/2019 1050 Gross per 24 hour  Intake 1320 ml  Output 1140 ml  Net 180 ml   Filed Weights   12/07/19 1504 12/09/19 1117  Weight: 93 kg 93 kg    Examination: General exam: Appears calm and  comfortable  Respiratory system: Clear to auscultation. Respiratory effort normal.  Cardiovascular system: S1 & S2 heard Gastrointestinal system: Abdomen is obese, soft and nontender.  Organs are difficult to assess.   Central nervous system: Awake and alert.  Patient moves all extremities.   Extremities: No significant leg edema.  Data Reviewed: I have personally reviewed following labs and imaging studies  CBC: Recent Labs  Lab 12/07/19 1532 12/08/19 0302 12/10/19 0222 12/11/19 0117 12/11/19 1531 12/12/19 0305 12/13/19 0510  WBC 11.2*   < > 10.4 9.5 13.3* 11.5* 10.7*  NEUTROABS 8.4*  --   --   --   --   --   --   HGB 11.6*   < > 7.1* 5.3* 8.6* 7.9* 8.3*  HCT 36.2   < > 22.5* 16.3* 26.1* 24.3* 25.4*  MCV 90.3   < > 88.9 89.6 86.1 86.5 87.6  PLT 245   < > 173 161 234 238 263   < > = values in this interval not displayed.   Basic Metabolic Panel: Recent Labs  Lab 12/09/19 0255 12/10/19 0222 12/11/19 0117 12/12/19 0305 12/13/19 0510  NA 135 134* 131* 136 135  K 4.5 4.6 4.6 4.3 4.1  CL 99 99 99 99 97*  CO2 29 27 26 29 28   GLUCOSE 169* 285* 288* 196* 106*  BUN 16 12 20 18 16   CREATININE 0.82 0.82 0.80 0.64 0.68  CALCIUM 8.7* 8.8* 8.1* 8.8* 8.8*  MG  --   --  1.7  --   --    GFR: Estimated Creatinine Clearance: 69 mL/min (by C-G formula based on SCr of 0.68 mg/dL). Liver Function Tests: Recent Labs  Lab 12/07/19 1532  AST 16  ALT 12  ALKPHOS 52  BILITOT 0.8  PROT 6.7  ALBUMIN 3.4*   CBG: Recent Labs  Lab 12/12/19 1102 12/12/19 1637 12/12/19 2124 12/13/19 0630 12/13/19 1131  GLUCAP 230* 202* 146* 134* 291*    Recent Results (from the past 240 hour(s))  Respiratory Panel by RT PCR (Flu A&B, Covid) - Nasopharyngeal Swab     Status: None   Collection Time: 12/07/19  4:53 PM   Specimen: Nasopharyngeal Swab  Result Value Ref Range Status   SARS Coronavirus 2 by RT PCR NEGATIVE NEGATIVE Final    Comment: (NOTE) SARS-CoV-2 target nucleic acids are NOT  DETECTED. The SARS-CoV-2 RNA is generally detectable in upper respiratoy specimens during the acute phase of infection. The lowest concentration of SARS-CoV-2 viral copies this assay can detect is 131 copies/mL. A negative result does not preclude SARS-Cov-2 infection and should not be used as the sole basis for treatment or other patient management decisions. A negative result may occur with  improper specimen collection/handling, submission of specimen other than nasopharyngeal swab, presence of viral mutation(s) within the areas targeted by this assay, and inadequate number of  viral copies (<131 copies/mL). A negative result must be combined with clinical observations, patient history, and epidemiological information. The expected result is Negative. Fact Sheet for Patients:  PinkCheek.be Fact Sheet for Healthcare Providers:  GravelBags.it This test is not yet ap proved or cleared by the [place] FDA and  has been authorized for detection and/or diagnosis of SARS-CoV-2 by FDA under an Emergency Use Authorization (EUA). This EUA will remain  in effect (meaning this test can be used) for the duration of the COVID-19 declaration under Section 564(b)(1) of the Act, 21 U.S.C. section 360bbb-3(b)(1), unless the authorization is terminated or revoked sooner.    Influenza A by PCR NEGATIVE NEGATIVE Final   Influenza B by PCR NEGATIVE NEGATIVE Final    Comment: (NOTE) The Xpert Xpress SARS-CoV-2/FLU/RSV assay is intended as an aid in  the diagnosis of influenza from Nasopharyngeal swab specimens and  should not be used as a sole basis for treatment. Nasal washings and  aspirates are unacceptable for Xpert Xpress SARS-CoV-2/FLU/RSV  testing. Fact Sheet for Patients: PinkCheek.be Fact Sheet for Healthcare Providers: GravelBags.it This test is not yet approved or cleared  by the [place] FDA and  has been authorized for detection and/or diagnosis of SARS-CoV-2 by  FDA under an Emergency Use Authorization (EUA). This EUA will remain  in effect (meaning this test can be used) for the duration of the  Covid-19 declaration under Section 564(b)(1) of the Act, 21  U.S.C. section 360bbb-3(b)(1), unless the authorization is  terminated or revoked. Performed at [facility], [place] [address] [place], [place] [address]   Surgical pcr screen     Status: None   Collection Time: 12/08/19  2:22 AM   Specimen: Nasal Mucosa; Nasal Swab  Result Value Ref Range Status   MRSA, PCR NEGATIVE NEGATIVE Final   Staphylococcus aureus NEGATIVE NEGATIVE Final    Comment: (NOTE) The Xpert SA Assay (FDA approved for NASAL specimens in patients 67 years of age and older), is one component of a comprehensive surveillance program. It is not intended to diagnose infection nor to guide or monitor treatment. Performed at [facility], [place] [address] [place], [place] [address]   SARS CORONAVIRUS 2 (TAT 6-24 HRS) Nasopharyngeal Nasopharyngeal Swab     Status: None   Collection Time: 12/12/19  4:20 PM   Specimen: Nasopharyngeal Swab  Result Value Ref Range Status   SARS Coronavirus 2 NEGATIVE NEGATIVE Final    Comment: (NOTE) SARS-CoV-2 target nucleic acids are NOT DETECTED. The SARS-CoV-2 RNA is generally detectable in upper and lower respiratory specimens during the acute phase of infection. Negative results do not preclude SARS-CoV-2 infection, do not rule out co-infections with other pathogens, and should not be used as the sole basis for treatment or other patient management decisions. Negative results must be combined with clinical observations, patient history, and epidemiological information. The expected result is Negative. Fact Sheet for Patients: SugarRoll.be Fact Sheet for Healthcare Providers:  https://www.woods-mathews.com/ This test is not yet approved or cleared by the [place] FDA and  has been authorized for detection and/or diagnosis of SARS-CoV-2 by FDA under an Emergency Use Authorization (EUA). This EUA will remain  in effect (meaning this test can be used) for the duration of the COVID-19 declaration under Section 56 4(b)(1) of the Act, 21 U.S.C. section 360bbb-3(b)(1), unless the authorization is terminated or revoked sooner. Performed at [facility], [place] [address], [place], [place] [address]  Radiology Studies: No results found.    Scheduled Meds: . docusate sodium  100 mg Oral BID  . ezetimibe  10 mg Oral Daily  . ferrous sulfate  325 mg Oral TID PC  . furosemide  20 mg Oral Daily  . insulin aspart  0-20 Units Subcutaneous TID WC  . insulin aspart  0-5 Units Subcutaneous QHS  . insulin glargine  40 Units Subcutaneous QHS  . linagliptin  5 mg Oral Daily  . metFORMIN  500 mg Oral TID WC  . metoprolol succinate  25 mg Oral Daily  . mirabegron ER  50 mg Oral Daily  . pantoprazole  40 mg Oral Daily  . pravastatin  40 mg Oral QHS  . ranolazine  500 mg Oral BID   Continuous Infusions:   LOS: 6 days    Time spent: 25 minutes   Barnetta Chapel, MD  [facility] 12/13/2019, 2:58 PM   Available via Epic secure chat 7am-7pm After these hours, please refer to coverage provider listed on [email or web address]

## 2019-12-14 LAB — GLUCOSE, CAPILLARY
Glucose-Capillary: 106 mg/dL — ABNORMAL HIGH (ref 70–99)
Glucose-Capillary: 189 mg/dL — ABNORMAL HIGH (ref 70–99)
Glucose-Capillary: 214 mg/dL — ABNORMAL HIGH (ref 70–99)
Glucose-Capillary: 159 mg/dL — ABNORMAL HIGH (ref 70–99)

## 2019-12-14 MED ORDER — APIXABAN 5 MG PO TABS
5.0000 mg | ORAL_TABLET | Freq: Two times a day (BID) | ORAL | Status: DC
  Administered 2019-12-14 – 2019-12-17 (×7): 5 mg via ORAL
  Filled 2019-12-14 (×7): qty 1

## 2019-12-14 NOTE — Progress Notes (Signed)
PROGRESS NOTE    Kelly Lee  VQM:[id number] DOB: May 20, 1951 DOA: 12/07/2019 PCP: Oval Linsey, PA-C     Brief Narrative:  Patient is a 69 year old female with history of atrial fibrillation/flutter on Eliquis, hypertension, diabetes, and CHF. She had a fall at home on 12/07/19 and was admitted for a left trochanteric fracture.  On 12/09/2019, she had surgery on her hip.  She recovered well from a pain standpoint.  On the night of 1/27 into 1/28, the patient was found to have a hemoglobin of 5.3.  Patient was transfused with 1.5 units of packed red blood cells.  Posttransfusion hemoglobin has remained stable.  12/13/2019: Patient seen.  No new complaints.  Pain is controlled.  Blood pressure and blood sugar are reasonably controlled.  No fever or chills.  No shortness of breath or chest pain.  Patient is stable from medical point of view.  Patient is awaiting discharge to skilled nursing facility.  12/14/2019: Patient seen.  Patient has remained stable.  Pursue discharge.  Assessment & Plan:  Principal Problem: Closed intertrochanteric fracture, left, initial encounter (HCC) Appreciate ortho, she is good to go to SNF from their standpoint S/p ORIF on 1/26 Norco 7.5-325 q 4 hrs prn severe pain Norco 5-325 mg q 4 hrs prn moderate pain Tylenol prn mild pain Zanaflex 4 mg TID prn PT  24 hr covid test ordered for SNF placement   Anemia             S/p transfusion of 1.5 units PRBCs             H/H has remained stable.     Continue to monitor H/H closely.  Drop in H/H is most likely postsurgical.             Transfuse as needed             Receiving Eliquis once okay with surgical team.  Hypertension Continue metoprolol XR 25 mg daily  Blood pressure is reasonably controlled.  Avoid excessive drop in patient's blood pressure.  Chronic respiratory failure with hypoxia  (HCC) Continue oxygen, at home levels right now  Stable for now.  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 Continue pravastatin 40 mg daily Continue Ranexa 500 mg twice daily  No symptoms.  OAB Continue Myrbetriq 50 mg daily  Morbid obesity with BMI of 40.0-44.9, adult (HCC)  PAF (paroxysmal atrial fibrillation) (HCC) Continue metoprololXL 25 mg/d             Resume Eliquis once okay with orthopedic team.  Type 2 diabetes mellitus, with long-term current use of insulin (HCC) Sliding scale insulin Basal bolus insulin Tradjenta 5 mg daily             Add back metformin 500 mg tid given renal function Blood sugars reasonably controlled.  Dyslipidemia Zetia 10 mg daily  DVT prophylaxis: SCDs Code Status: Full        Family Communication: Self Disposition Plan: SNF; coming from home; barriers to d/c include bed availability, required negative Covid test  Consultants:   Ortho  Procedures:   ORIF 1/26  Blood transfusion 1/28   Antimicrobials:  Anti-infectives (From admission, onward)   Start     Dose/Rate Route Frequency Ordered Stop   12/09/19 1445  vancomycin (VANCOCIN) IVPB 1000 mg/200 mL premix     1,000 mg 200 mL/hr over 60 Minutes Intravenous Every 12 hours 12/09/19 1442 12/09/19 1800       Objective: Vitals:  12/13/19 2035 12/14/19 0308 12/14/19 0940 12/14/19 1323  BP: (!) 96/51 108/77 (!) 130/41 (!) 157/79  Pulse: 77 92 69 (!) 53  Resp: 14 14  18   Temp: 99.5 F (37.5 C) 98.5 F (36.9 C)  98.6 F (37 C)  TempSrc: Oral Oral    SpO2: 98% 100%  99%  Weight:      Height:        Intake/Output Summary (Last 24 hours) at 12/14/2019 1751 Last data filed at 12/14/2019 1130 Gross per 24 hour  Intake 360 ml  Output 1900 ml  Net -1540 ml   Filed  Weights   12/07/19 1504 12/09/19 1117  Weight: 93 kg 93 kg    Examination: General exam: Appears calm and comfortable  Respiratory system: Clear to auscultation. Respiratory effort normal.  Cardiovascular system: S1 & S2 heard Gastrointestinal system: Abdomen is obese, soft and nontender.  Organs are difficult to assess.   Central nervous system: Awake and alert.  Patient moves all extremities.   Extremities: No significant leg edema.  Data Reviewed: I have personally reviewed following labs and imaging studies  CBC: Recent Labs  Lab 12/10/19 0222 12/11/19 0117 12/11/19 1531 12/12/19 0305 12/13/19 0510  WBC 10.4 9.5 13.3* 11.5* 10.7*  HGB 7.1* 5.3* 8.6* 7.9* 8.3*  HCT 22.5* 16.3* 26.1* 24.3* 25.4*  MCV 88.9 89.6 86.1 86.5 87.6  PLT 173 161 234 238 350   Basic Metabolic Panel: Recent Labs  Lab 12/09/19 0255 12/10/19 0222 12/11/19 0117 12/12/19 0305 12/13/19 0510  NA 135 134* 131* 136 135  K 4.5 4.6 4.6 4.3 4.1  CL 99 99 99 99 97*  CO2 29 27 26 29 28   GLUCOSE 169* 285* 288* 196* 106*  BUN 16 12 20 18 16   CREATININE 0.82 0.82 0.80 0.64 0.68  CALCIUM 8.7* 8.8* 8.1* 8.8* 8.8*  MG  --   --  1.7  --   --    GFR: Estimated Creatinine Clearance: 69 mL/min (by C-G formula based on SCr of 0.68 mg/dL). Liver Function Tests: No results for input(s): AST, ALT, ALKPHOS, BILITOT, PROT, ALBUMIN in the last 168 hours. CBG: Recent Labs  Lab 12/13/19 1624 12/13/19 2105 12/14/19 0648 12/14/19 1209 12/14/19 1626  GLUCAP 141* 119* 106* 189* 214*    Recent Results (from the past 240 hour(s))  Respiratory Panel by RT PCR (Flu A&B, Covid) - Nasopharyngeal Swab     Status: None   Collection Time: 12/07/19  4:53 PM   Specimen: Nasopharyngeal Swab  Result Value Ref Range Status   SARS Coronavirus 2 by RT PCR NEGATIVE NEGATIVE Final    Comment: (NOTE) SARS-CoV-2 target nucleic acids are NOT DETECTED. The SARS-CoV-2 RNA is generally detectable in upper respiratoy specimens  during the acute phase of infection. The lowest concentration of SARS-CoV-2 viral copies this assay can detect is 131 copies/mL. A negative result does not preclude SARS-Cov-2 infection and should not be used as the sole basis for treatment or other patient management decisions. A negative result may occur with  improper specimen collection/handling, submission of specimen other than nasopharyngeal swab, presence of viral mutation(s) within the areas targeted by this assay, and inadequate number of viral copies (<131 copies/mL). A negative result must be combined with clinical observations, patient history, and epidemiological information. The expected result is Negative. Fact Sheet for Patients:  PinkCheek.be Fact Sheet for Healthcare Providers:  GravelBags.it This test is not yet ap proved or cleared by the [place] FDA and  has been  authorized for detection and/or diagnosis of SARS-CoV-2 by FDA under an Emergency Use Authorization (EUA). This EUA will remain  in effect (meaning this test can be used) for the duration of the COVID-19 declaration under Section 564(b)(1) of the Act, 21 U.S.C. section 360bbb-3(b)(1), unless the authorization is terminated or revoked sooner.    Influenza A by PCR NEGATIVE NEGATIVE Final   Influenza B by PCR NEGATIVE NEGATIVE Final    Comment: (NOTE) The Xpert Xpress SARS-CoV-2/FLU/RSV assay is intended as an aid in  the diagnosis of influenza from Nasopharyngeal swab specimens and  should not be used as a sole basis for treatment. Nasal washings and  aspirates are unacceptable for Xpert Xpress SARS-CoV-2/FLU/RSV  testing. Fact Sheet for Patients: https://www.moore.com/ Fact Sheet for Healthcare Providers: https://www.young.biz/ This test is not yet approved or cleared by the [place] FDA and  has been authorized for detection and/or diagnosis of  SARS-CoV-2 by  FDA under an Emergency Use Authorization (EUA). This EUA will remain  in effect (meaning this test can be used) for the duration of the  Covid-19 declaration under Section 564(b)(1) of the Act, 21  U.S.C. section 360bbb-3(b)(1), unless the authorization is  terminated or revoked. Performed at [facility], [address] [place], [place] [address]   Surgical pcr screen     Status: None   Collection Time: 12/08/19  2:22 AM   Specimen: Nasal Mucosa; Nasal Swab  Result Value Ref Range Status   MRSA, PCR NEGATIVE NEGATIVE Final   Staphylococcus aureus NEGATIVE NEGATIVE Final    Comment: (NOTE) The Xpert SA Assay (FDA approved for NASAL specimens in patients 5 years of age and older), is one component of a comprehensive surveillance program. It is not intended to diagnose infection nor to guide or monitor treatment. Performed at [facility], [address] [place], [place] [address]   SARS CORONAVIRUS 2 (TAT 6-24 HRS) Nasopharyngeal Nasopharyngeal Swab     Status: None   Collection Time: 12/12/19  4:20 PM   Specimen: Nasopharyngeal Swab  Result Value Ref Range Status   SARS Coronavirus 2 NEGATIVE NEGATIVE Final    Comment: (NOTE) SARS-CoV-2 target nucleic acids are NOT DETECTED. The SARS-CoV-2 RNA is generally detectable in upper and lower respiratory specimens during the acute phase of infection. Negative results do not preclude SARS-CoV-2 infection, do not rule out co-infections with other pathogens, and should not be used as the sole basis for treatment or other patient management decisions. Negative results must be combined with clinical observations, patient history, and epidemiological information. The expected result is Negative. Fact Sheet for Patients: HairSlick.no Fact Sheet for Healthcare Providers: quierodirigir.com This test is not yet approved or cleared by the [place] FDA and   has been authorized for detection and/or diagnosis of SARS-CoV-2 by FDA under an Emergency Use Authorization (EUA). This EUA will remain  in effect (meaning this test can be used) for the duration of the COVID-19 declaration under Section 56 4(b)(1) of the Act, 21 U.S.C. section 360bbb-3(b)(1), unless the authorization is terminated or revoked sooner. Performed at [facility], [address] [place], [place] [address]       Radiology Studies: No results found.    Scheduled Meds: . apixaban  5 mg Oral BID  . docusate sodium  100 mg Oral BID  . ezetimibe  10 mg Oral Daily  . ferrous sulfate  325 mg Oral TID PC  . furosemide  20 mg Oral Daily  .  insulin aspart  0-20 Units Subcutaneous TID WC  . insulin aspart  0-5 Units Subcutaneous QHS  . insulin glargine  40 Units Subcutaneous QHS  . linagliptin  5 mg Oral Daily  . metFORMIN  500 mg Oral TID WC  . metoprolol succinate  25 mg Oral Daily  . mirabegron ER  50 mg Oral Daily  . pantoprazole  40 mg Oral Daily  . pravastatin  40 mg Oral QHS  . ranolazine  500 mg Oral BID   Continuous Infusions:   LOS: 7 days    Time spent: 25 minutes   Barnetta Chapel, MD  [facility] 12/14/2019, 5:51 PM   Available via Epic secure chat 7am-7pm After these hours, please refer to coverage provider listed on [email or web address]

## 2019-12-14 NOTE — Plan of Care (Signed)

## 2019-12-14 NOTE — Progress Notes (Signed)
Patient ID: Kelly Lee, female   DOB: [birth year], 69 y.o.   MRN: [id number] Subjective: 5 Days Post-Op Procedure(s) (LRB): INTRAMEDULLARY (IM) NAIL FEMORAL (Left)    Patient reports pain as mild to moderate.  Still working hard on improving mobility both while in bed and with therapy.  Feels like her left leg is "stuck"  Objective:   VITALS:   Vitals:   12/13/19 2035 12/14/19 0308  BP: (!) 96/51 108/77  Pulse: 77 92  Resp: 14 14  Temp: 99.5 F (37.5 C) 98.5 F (36.9 C)  SpO2: 98% 100%    Neurovascular intact Incision: dressing C/D/I  LABS Recent Labs    12/11/19 1531 12/12/19 0305 12/13/19 0510  HGB 8.6* 7.9* 8.3*  HCT 26.1* 24.3* 25.4*  WBC 13.3* 11.5* 10.7*  PLT 234 238 263    Recent Labs    12/12/19 0305 12/13/19 0510  NA 136 135  K 4.3 4.1  BUN 18 16  CREATININE 0.64 0.68  GLUCOSE 196* 106*    No results for input(s): LABPT, INR in the last 72 hours.   Assessment/Plan: 5 Days Post-Op Procedure(s) (LRB): INTRAMEDULLARY (IM) NAIL FEMORAL (Left)   Up with therapy Discharge to SNF - when coordinated, family assisting  Changed eliquis back to preadmission dose PWB LLE RTC in 2 weeks

## 2019-12-14 NOTE — Plan of Care (Signed)
  Problem: Safety: Goal: Ability to remain free from injury will improve Outcome: Progressing   

## 2019-12-14 NOTE — TOC Progression Note (Signed)
Transition of Care [facility]) - Progression Note    Patient Details  Name: Kelly Lee MRN: [id number] Date of Birth: 28-Jul-1951  Transition of Care [facility]) CM/SW Contact  Patrice Paradise, LCSW Phone Number:[phone] 12/14/2019, 2:16 PM  Clinical Narrative:     CSW spoke to patient again about bed choice. Patent stated that she wanted more time to decide. CSW spoke with RN to ascertain patient's discharge date. RN informed CSW that she is close to being medically stable for discharge and just waiting on a placement.  RN and CSW spoke with patient again about bed choice due to her discharge date approaching. Patient chose [facility] in [place].  CSW spoke with [facility] and they confirmed bed readiness. Facility stated that COVID had to be within 48 hours of discharge. Patient will need another COVID. CSW alerted RN that another COVID test was need.  TOC team will continue to follow for discharge planning needs.  Expected Discharge Plan: Skilled Nursing Facility Barriers to Discharge: Continued Medical Work up, English as a second language teacher  Expected Discharge Plan and Services Expected Discharge Plan: Skilled Nursing Facility     Post Acute Care Choice: Skilled Nursing Facility Living arrangements for the past 2 months: Single Family Home                                       Social Determinants of Health (SDOH) Interventions    Readmission Risk Interventions Readmission Risk Prevention Plan 12/12/2019  Transportation Screening Complete  PCP or Specialist Appt within 5-7 Days Not Complete  Not Complete comments plan for SNF  Home Care Screening Complete  Medication Review (RN CM) Referral to Pharmacy

## 2019-12-15 LAB — SARS CORONAVIRUS 2 (TAT 6-24 HRS): SARS Coronavirus 2: NEGATIVE

## 2019-12-15 LAB — GLUCOSE, CAPILLARY
Glucose-Capillary: 159 mg/dL — ABNORMAL HIGH (ref 70–99)
Glucose-Capillary: 214 mg/dL — ABNORMAL HIGH (ref 70–99)
Glucose-Capillary: 159 mg/dL — ABNORMAL HIGH (ref 70–99)
Glucose-Capillary: 148 mg/dL — ABNORMAL HIGH (ref 70–99)

## 2019-12-15 NOTE — Progress Notes (Signed)
Physical Therapy Treatment Patient Details Name: Kelly Lee MRN: [id number] DOB: June 11, 1951 Today's Date: 12/15/2019    History of Present Illness Pt is a 69 y/o female admitted after falling at home in which she sustained a complex, comminuted peri-trochanteric L hip fracture and R shoulder contusion. Pt is now s/p IM nail. PMH including but not limited to a-fib, CHF, DM and HTN.    PT Comments    Pt willing to progress mobility and able to stand today with assist but unable to weight shift or step and required use of STedy to transition OOB to chair. Pt with genu valgus and body habitus that makes bil LE positioning for standing more difficult. Pt educated for bil LE HEP, transfers and progression. Will continue to follow with SNF still appropriate.     Follow Up Recommendations  SNF;Supervision for mobility/OOB     Equipment Recommendations  Other (comment)(defer to next venue)    Recommendations for Other Services       Precautions / Restrictions Precautions Precautions: Fall Restrictions Weight Bearing Restrictions: Yes LLE Weight Bearing: Partial weight bearing LLE Partial Weight Bearing Percentage or Pounds: 50%    Mobility  Bed Mobility Overal bed mobility: Needs Assistance Bed Mobility: Supine to Sit     Supine to sit: Min assist;HOB elevated     General bed mobility comments: min assist with HOB elevated, use of rail and assist to move LLE, increased time  Transfers Overall transfer level: Needs assistance   Transfers: Sit to/from Stand Sit to Stand: Mod assist;+2 physical assistance;From elevated surface         General transfer comment: mod +2 to stand from elevated surface with Left knee blocked. foot position not optimal due to knee valgus and pt limited by pain. Pt able to stand from bed but unable to weight shift or even scoot feet to try to transition to chair. Utilized stedy for additional stand from bed and transfer to chair. Pt able to rise  unassisted from stedy pads.  Ambulation/Gait             General Gait Details: unable   Stairs             Wheelchair Mobility    Modified Rankin (Stroke Patients Only)       Balance Overall balance assessment: Needs assistance;History of Falls   Sitting balance-Leahy Scale: Fair Sitting balance - Comments: EOB without physical support     Standing balance-Leahy Scale: Poor Standing balance comment: bil UE support to stand                            Cognition Arousal/Alertness: Awake/alert Behavior During Therapy: WFL for tasks assessed/performed Overall Cognitive Status: Within Functional Limits for tasks assessed                                        Exercises Total Joint Exercises Long Arc Quad: AROM;AAROM;Right;Left;Seated;10 reps(AAROM on LLE) Marching in Standing: AROM;AAROM;Right;Left;Seated;10 reps(AAROM on LLE)    General Comments        Pertinent Vitals/Pain Pain Assessment: 0-10 Pain Score: 8  Pain Location: left hip with transfers Pain Descriptors / Indicators: Guarding;Aching Pain Intervention(s): Limited activity within patient's tolerance;Monitored during session;Repositioned;Other (comment)(requested premedication 1 hr prior to session, was not received and RN medicated during session)    Home Living  Prior Function            PT Goals (current goals can now be found in the care plan section) Progress towards PT goals: Progressing toward goals    Frequency    Min 3X/week      PT Plan Current plan remains appropriate    Co-evaluation              AM-PAC PT "6 Clicks" Mobility   Outcome Measure  Help needed turning from your back to your side while in a flat bed without using bedrails?: A Little Help needed moving from lying on your back to sitting on the side of a flat bed without using bedrails?: A Little Help needed moving to and from a bed to a chair  (including a wheelchair)?: Total Help needed standing up from a chair using your arms (e.g., wheelchair or bedside chair)?: A Lot Help needed to walk in hospital room?: Total Help needed climbing 3-5 steps with a railing? : Total 6 Click Score: 11    End of Session Equipment Utilized During Treatment: Gait belt Activity Tolerance: Patient tolerated treatment well Patient left: in chair;with call bell/phone within reach;with chair alarm set;with nursing/sitter in room Nurse Communication: Mobility status;Precautions;Weight bearing status PT Visit Diagnosis: Other abnormalities of gait and mobility (R26.89);Pain;Muscle weakness (generalized) (M62.81) Pain - Right/Left: Left Pain - part of body: Hip     Time: 1287-8676 PT Time Calculation (min) (ACUTE ONLY): 30 min  Charges:  $Therapeutic Exercise: 8-22 mins $Therapeutic Activity: 8-22 mins                     Olin Gurski P, PT Acute Rehabilitation Services Pager: [phone] Office: [phone]    Kelly Lee 12/15/2019, 12:54 PM

## 2019-12-15 NOTE — Progress Notes (Signed)
PROGRESS NOTE    Kelly Lee  LGX:[id number] DOB: 05/24/1951 DOA: 12/07/2019 PCP: Oval Linsey, PA-C      Brief Narrative:  Kelly Lee is a 69 y.o. F with AF on Eliquis, DM, HTN, dCHF, and obesity who presented with fall.  In the ER, found to have a left greater trochanter fracture.  Orthopedic surgery recommended amdmission for surgical repair.          Assessment & Plan:  Fracture repair Stable for discharge  Atrial fibrillation Stable -Continue Eliquis  Diabetes Glucoses controlled -Continue DPPIV inhib, metformin, Lantus, SS corrections  Hypertension Chronic diastolic CHF Coronary disease secondary prevention Euvolemic, BP labile. -Continue furosemide, metoprolol -Continue  Zetia, statin, ranolazine  Anemia -Continue iron  Other meds -Continue mirabegron -Continue PPI       Disposition: The patient was admitted with femur fracture.  She is independent at baseline, at curent is completely dependent with max assist just to stand, due to pain from fracture repair.    I will discharge when safe disposition is available.        MDM: The below labs and imaging reports were reviewed and summarized above.  Medication management as above.             Subjective: Feeling well.  Still a lot of pain in hip.  No confusion, fever, bleeding  Objective: Vitals:   12/15/19 0338 12/15/19 0808 12/15/19 1328 12/15/19 2013  BP: 120/83 (!) 98/56 (!) 109/50 (!) 146/98  Pulse: 89 70 78 93  Resp: 14 17 17 16   Temp: 98.6 F (37 C) 98.2 F (36.8 C) 98.4 F (36.9 C) 98.2 F (36.8 C)  TempSrc: Oral Oral Oral Oral  SpO2: 100% 97% 100% 100%  Weight:      Height:        Intake/Output Summary (Last 24 hours) at 12/15/2019 2207 Last data filed at 12/15/2019 1815 Gross per 24 hour  Intake --  Output 1950 ml  Net -1950 ml   Filed Weights   12/07/19 1504 12/09/19 1117  Weight: 93 kg 93 kg    Examination: General appearance:  adult female,  alert and in moderate distress from pain.     Cardiac: RRR, nl S1-S2, no murmurs appreciated.  No LE edema  Respiratory: Normal respiratory rate and rhythm.  CTAB without rales or wheezes. Abdomen: Abdomen soft.  no TTP. No ascites, distension, hepatosplenomegaly.   MSK: No deformities or effusions. Neuro: Awake and alert.  EOMI, moves upper extremities. Speech fluent.    Psych: Sensorium intact and responding to questions, attention normal. Affect normal.  Judgment and insight appear normal.    Data Reviewed: I have personally reviewed following labs and imaging studies:  CBC: Recent Labs  Lab 12/10/19 0222 12/11/19 0117 12/11/19 1531 12/12/19 0305 12/13/19 0510  WBC 10.4 9.5 13.3* 11.5* 10.7*  HGB 7.1* 5.3* 8.6* 7.9* 8.3*  HCT 22.5* 16.3* 26.1* 24.3* 25.4*  MCV 88.9 89.6 86.1 86.5 87.6  PLT 173 161 234 238 263   Basic Metabolic Panel: Recent Labs  Lab 12/09/19 0255 12/10/19 0222 12/11/19 0117 12/12/19 0305 12/13/19 0510  NA 135 134* 131* 136 135  K 4.5 4.6 4.6 4.3 4.1  CL 99 99 99 99 97*  CO2 29 27 26 29 28   GLUCOSE 169* 285* 288* 196* 106*  BUN 16 12 20 18 16   CREATININE 0.82 0.82 0.80 0.64 0.68  CALCIUM 8.7* 8.8* 8.1* 8.8* 8.8*  MG  --   --  1.7  --   --  GFR: Estimated Creatinine Clearance: 69 mL/min (by C-G formula based on SCr of 0.68 mg/dL). Liver Function Tests: No results for input(s): AST, ALT, ALKPHOS, BILITOT, PROT, ALBUMIN in the last 168 hours. No results for input(s): LIPASE, AMYLASE in the last 168 hours. No results for input(s): AMMONIA in the last 168 hours. Coagulation Profile: No results for input(s): INR, PROTIME in the last 168 hours. Cardiac Enzymes: No results for input(s): CKTOTAL, CKMB, CKMBINDEX, TROPONINI in the last 168 hours. BNP (last 3 results) No results for input(s): PROBNP in the last 8760 hours. HbA1C: No results for input(s): HGBA1C in the last 72 hours. CBG: Recent Labs  Lab 12/14/19 2215 12/15/19 0709  12/15/19 1123 12/15/19 1636 12/15/19 2028  GLUCAP 159* 159* 214* 159* 148*   Lipid Profile: No results for input(s): CHOL, HDL, LDLCALC, TRIG, CHOLHDL, LDLDIRECT in the last 72 hours. Thyroid Function Tests: No results for input(s): TSH, T4TOTAL, FREET4, T3FREE, THYROIDAB in the last 72 hours. Anemia Panel: No results for input(s): VITAMINB12, FOLATE, FERRITIN, TIBC, IRON, RETICCTPCT in the last 72 hours. Urine analysis: No results found for: COLORURINE, APPEARANCEUR, LABSPEC, PHURINE, GLUCOSEU, HGBUR, BILIRUBINUR, KETONESUR, PROTEINUR, UROBILINOGEN, NITRITE, LEUKOCYTESUR Sepsis Labs: @LABRCNTIP (procalcitonin:4,lacticacidven:4)  ) Recent Results (from the past 240 hour(s))  Respiratory Panel by RT PCR (Flu A&B, Covid) - Nasopharyngeal Swab     Status: None   Collection Time: 12/07/19  4:53 PM   Specimen: Nasopharyngeal Swab  Result Value Ref Range Status   SARS Coronavirus 2 by RT PCR NEGATIVE NEGATIVE Final    Comment: (NOTE) SARS-CoV-2 target nucleic acids are NOT DETECTED. The SARS-CoV-2 RNA is generally detectable in upper respiratoy specimens during the acute phase of infection. The lowest concentration of SARS-CoV-2 viral copies this assay can detect is 131 copies/mL. A negative result does not preclude SARS-Cov-2 infection and should not be used as the sole basis for treatment or other patient management decisions. A negative result may occur with  improper specimen collection/handling, submission of specimen other than nasopharyngeal swab, presence of viral mutation(s) within the areas targeted by this assay, and inadequate number of viral copies (<131 copies/mL). A negative result must be combined with clinical observations, patient history, and epidemiological information. The expected result is Negative. Fact Sheet for Patients:  12/09/19 Fact Sheet for Healthcare Providers:  https://www.moore.com/ This test is  not yet ap proved or cleared by the https://www.young.biz/ FDA and  has been authorized for detection and/or diagnosis of SARS-CoV-2 by FDA under an Emergency Use Authorization (EUA). This EUA will remain  in effect (meaning this test can be used) for the duration of the COVID-19 declaration under Section 564(b)(1) of the Act, 21 U.S.C. section 360bbb-3(b)(1), unless the authorization is terminated or revoked sooner.    Influenza A by PCR NEGATIVE NEGATIVE Final   Influenza B by PCR NEGATIVE NEGATIVE Final    Comment: (NOTE) The Xpert Xpress SARS-CoV-2/FLU/RSV assay is intended as an aid in  the diagnosis of influenza from Nasopharyngeal swab specimens and  should not be used as a sole basis for treatment. Nasal washings and  aspirates are unacceptable for Xpert Xpress SARS-CoV-2/FLU/RSV  testing. Fact Sheet for Patients: [place] Fact Sheet for Healthcare Providers: https://www.moore.com/ This test is not yet approved or cleared by the https://www.young.biz/ FDA and  has been authorized for detection and/or diagnosis of SARS-CoV-2 by  FDA under an Emergency Use Authorization (EUA). This EUA will remain  in effect (meaning this test can be used) for the duration of the  Covid-19  declaration under Section 564(b)(1) of the Act, 21  U.S.C. section 360bbb-3(b)(1), unless the authorization is  terminated or revoked. Performed at [facility], [place] [address], [place], [place] [address]   Surgical pcr screen     Status: None   Collection Time: 12/08/19  2:22 AM   Specimen: Nasal Mucosa; Nasal Swab  Result Value Ref Range Status   MRSA, PCR NEGATIVE NEGATIVE Final   Staphylococcus aureus NEGATIVE NEGATIVE Final    Comment: (NOTE) The Xpert SA Assay (FDA approved for NASAL specimens in patients 22 years of age and older), is one component of a comprehensive surveillance program. It is not intended to diagnose infection nor to guide or monitor  treatment. Performed at [facility], [place] [address], [place], [place] [address]   SARS CORONAVIRUS 2 (TAT 6-24 HRS) Nasopharyngeal Nasopharyngeal Swab     Status: None   Collection Time: 12/12/19  4:20 PM   Specimen: Nasopharyngeal Swab  Result Value Ref Range Status   SARS Coronavirus 2 NEGATIVE NEGATIVE Final    Comment: (NOTE) SARS-CoV-2 target nucleic acids are NOT DETECTED. The SARS-CoV-2 RNA is generally detectable in upper and lower respiratory specimens during the acute phase of infection. Negative results do not preclude SARS-CoV-2 infection, do not rule out co-infections with other pathogens, and should not be used as the sole basis for treatment or other patient management decisions. Negative results must be combined with clinical observations, patient history, and epidemiological information. The expected result is Negative. Fact Sheet for Patients: SugarRoll.be Fact Sheet for Healthcare Providers: https://www.woods-mathews.com/ This test is not yet approved or cleared by the [place] FDA and  has been authorized for detection and/or diagnosis of SARS-CoV-2 by FDA under an Emergency Use Authorization (EUA). This EUA will remain  in effect (meaning this test can be used) for the duration of the COVID-19 declaration under Section 56 4(b)(1) of the Act, 21 U.S.C. section 360bbb-3(b)(1), unless the authorization is terminated or revoked sooner. Performed at [facility], [place] [address], [place], [place] [address]   SARS CORONAVIRUS 2 (TAT 6-24 HRS) Nasopharyngeal Nasopharyngeal Swab     Status: None   Collection Time: 12/14/19  7:58 PM   Specimen: Nasopharyngeal Swab  Result Value Ref Range Status   SARS Coronavirus 2 NEGATIVE NEGATIVE Final    Comment: (NOTE) SARS-CoV-2 target nucleic acids are NOT DETECTED. The SARS-CoV-2 RNA is generally detectable in upper and lower respiratory specimens during the acute  phase of infection. Negative results do not preclude SARS-CoV-2 infection, do not rule out co-infections with other pathogens, and should not be used as the sole basis for treatment or other patient management decisions. Negative results must be combined with clinical observations, patient history, and epidemiological information. The expected result is Negative. Fact Sheet for Patients: SugarRoll.be Fact Sheet for Healthcare Providers: https://www.woods-mathews.com/ This test is not yet approved or cleared by the [place] FDA and  has been authorized for detection and/or diagnosis of SARS-CoV-2 by FDA under an Emergency Use Authorization (EUA). This EUA will remain  in effect (meaning this test can be used) for the duration of the COVID-19 declaration under Section 56 4(b)(1) of the Act, 21 U.S.C. section 360bbb-3(b)(1), unless the authorization is terminated or revoked sooner. Performed at [facility], [place] [address] [place], [place] [address]          Radiology Studies: No results found.      Scheduled Meds: . apixaban  5 mg Oral  BID  . docusate sodium  100 mg Oral BID  . ezetimibe  10 mg Oral Daily  . ferrous sulfate  325 mg Oral TID PC  . furosemide  20 mg Oral Daily  . insulin aspart  0-20 Units Subcutaneous TID WC  . insulin aspart  0-5 Units Subcutaneous QHS  . insulin glargine  40 Units Subcutaneous QHS  . linagliptin  5 mg Oral Daily  . metFORMIN  500 mg Oral TID WC  . metoprolol succinate  25 mg Oral Daily  . mirabegron ER  50 mg Oral Daily  . pantoprazole  40 mg Oral Daily  . pravastatin  40 mg Oral QHS  . ranolazine  500 mg Oral BID   Continuous Infusions:   LOS: 8 days    Time spent: 25 minutes    Alberteen Sam, MD [facility] 12/15/2019, 10:07 PM     Please page though AMION or Epic secure chat:  For [employer], Higher education careers adviser

## 2019-12-15 NOTE — Clinical Social Work Note (Signed)
CSW informed that patient is medically ready for discharge.   CSW left 2 messages starting at 8:45 am this morning for [facility]) admissions coordinator to inquire about bed offered.   CSW informed patient that she may have to choose another facility due to [facility] not answering and or returning calls. Patient chose [facility]. CSW called and left message for their admissions coordinator.   COVID test negative on 2/1. Patient remains medically ready.

## 2019-12-15 NOTE — Plan of Care (Signed)

## 2019-12-16 LAB — GLUCOSE, CAPILLARY
Glucose-Capillary: 128 mg/dL — ABNORMAL HIGH (ref 70–99)
Glucose-Capillary: 151 mg/dL — ABNORMAL HIGH (ref 70–99)
Glucose-Capillary: 107 mg/dL — ABNORMAL HIGH (ref 70–99)
Glucose-Capillary: 161 mg/dL — ABNORMAL HIGH (ref 70–99)

## 2019-12-16 LAB — SARS CORONAVIRUS 2 (TAT 6-24 HRS): SARS Coronavirus 2: NEGATIVE

## 2019-12-16 MED ORDER — SENNOSIDES-DOCUSATE SODIUM 8.6-50 MG PO TABS
1.0000 | ORAL_TABLET | Freq: Two times a day (BID) | ORAL | Status: DC
  Administered 2019-12-16 – 2019-12-17 (×2): 1 via ORAL
  Filled 2019-12-16 (×3): qty 1

## 2019-12-16 NOTE — Progress Notes (Signed)
PROGRESS NOTE    Kelly Lee  ZMO:[id number] DOB: 27-May-1951 DOA: 12/07/2019 PCP: Oval Linsey, PA-C  Brief Narrative:  Kelly Lee is a 69 y.o. F with AF on Eliquis, DM, HTN, dCHF, and obesity who presented with fall. In the ER, found to have a left greater trochanter fracture.  Orthopedic surgery consulted underwent  ORIF 1/26 -Postop course complicated by anemia  Assessment & Plan:  Closed intertrochanteric fracture, left, -Orthopedics consulted, status post ORIF 1/26 -PT OT eval completed -SNF recommended for rehabilitation -Apixaban resumed  Postop blood loss anemia -Transfuse 2 units of PRBC -Hemoglobin stable and improved, CBC in a.m.  Paroxysmal atrial fibrillation -In sinus rhythm at this time, continue metoprolol and Eliquis  Type 2 diabetes mellitus -CBGs in the 1 20-1 70 range -Continue Lantus 40 units, sliding scale and Metformin  Hypertension Chronic diastolic CHF Coronary disease  -Stable, continue metoprolol, statin, Zetia, furosemide  Full code DVT prophylaxis with apixaban Family communication, none at bedside Disposition: Remains medically stable for discharge to rehabilitation, awaiting bed at this time   Subjective: -No events overnight, ambulating a little with physical therapy, continues to have discomfort in her left hip  Objective: Vitals:   12/16/19 0906 12/16/19 0918 12/16/19 0920 12/16/19 0921  BP: (!) 132/52     Pulse: (!) 41 79 73 68  Resp: 16     Temp: 98 F (36.7 C)     TempSrc: Oral     SpO2: 98% 100% 100%   Weight:      Height:        Intake/Output Summary (Last 24 hours) at 12/16/2019 1433 Last data filed at 12/16/2019 1100 Gross per 24 hour  Intake 240 ml  Output 2100 ml  Net -1860 ml   Filed Weights   12/07/19 1504 12/09/19 1117  Weight: 93 kg 93 kg    Examination: Gen: AAOx3, no distress HEENT:  no JVD Lungs: Clear bilaterally CVS: S1-S2, regular rate rhythm Abd: soft, Non tender, non distended, BS  present Extremities: No edema, left hip with surgical incision, healing well Skin: no new rashes   Data Reviewed: I have personally reviewed following labs and imaging studies:  CBC: Recent Labs  Lab 12/10/19 0222 12/11/19 0117 12/11/19 1531 12/12/19 0305 12/13/19 0510  WBC 10.4 9.5 13.3* 11.5* 10.7*  HGB 7.1* 5.3* 8.6* 7.9* 8.3*  HCT 22.5* 16.3* 26.1* 24.3* 25.4*  MCV 88.9 89.6 86.1 86.5 87.6  PLT 173 161 234 238 263   Basic Metabolic Panel: Recent Labs  Lab 12/10/19 0222 12/11/19 0117 12/12/19 0305 12/13/19 0510  NA 134* 131* 136 135  K 4.6 4.6 4.3 4.1  CL 99 99 99 97*  CO2 27 26 29 28   GLUCOSE 285* 288* 196* 106*  BUN 12 20 18 16   CREATININE 0.82 0.80 0.64 0.68  CALCIUM 8.8* 8.1* 8.8* 8.8*  MG  --  1.7  --   --    GFR: Estimated Creatinine Clearance: 69 mL/min (by C-G formula based on SCr of 0.68 mg/dL). Liver Function Tests: No results for input(s): AST, ALT, ALKPHOS, BILITOT, PROT, ALBUMIN in the last 168 hours. No results for input(s): LIPASE, AMYLASE in the last 168 hours. No results for input(s): AMMONIA in the last 168 hours. Coagulation Profile: No results for input(s): INR, PROTIME in the last 168 hours. Cardiac Enzymes: No results for input(s): CKTOTAL, CKMB, CKMBINDEX, TROPONINI in the last 168 hours. BNP (last 3 results) No results for input(s): PROBNP in the last 8760 hours. HbA1C: No results  for input(s): HGBA1C in the last 72 hours. CBG: Recent Labs  Lab 12/15/19 1123 12/15/19 1636 12/15/19 2028 12/16/19 0630 12/16/19 1218  GLUCAP 214* 159* 148* 128* 151*   Lipid Profile: No results for input(s): CHOL, HDL, LDLCALC, TRIG, CHOLHDL, LDLDIRECT in the last 72 hours. Thyroid Function Tests: No results for input(s): TSH, T4TOTAL, FREET4, T3FREE, THYROIDAB in the last 72 hours. Anemia Panel: No results for input(s): VITAMINB12, FOLATE, FERRITIN, TIBC, IRON, RETICCTPCT in the last 72 hours. Urine analysis: No results found for: COLORURINE,  APPEARANCEUR, LABSPEC, PHURINE, GLUCOSEU, HGBUR, BILIRUBINUR, KETONESUR, PROTEINUR, UROBILINOGEN, NITRITE, LEUKOCYTESUR Sepsis Labs: @LABRCNTIP (procalcitonin:4,lacticacidven:4)  ) Recent Results (from the past 240 hour(s))  Respiratory Panel by RT PCR (Flu A&B, Covid) - Nasopharyngeal Swab     Status: None   Collection Time: 12/07/19  4:53 PM   Specimen: Nasopharyngeal Swab  Result Value Ref Range Status   SARS Coronavirus 2 by RT PCR NEGATIVE NEGATIVE Final    Comment: (NOTE) SARS-CoV-2 target nucleic acids are NOT DETECTED. The SARS-CoV-2 RNA is generally detectable in upper respiratoy specimens during the acute phase of infection. The lowest concentration of SARS-CoV-2 viral copies this assay can detect is 131 copies/mL. A negative result does not preclude SARS-Cov-2 infection and should not be used as the sole basis for treatment or other patient management decisions. A negative result may occur with  improper specimen collection/handling, submission of specimen other than nasopharyngeal swab, presence of viral mutation(s) within the areas targeted by this assay, and inadequate number of viral copies (<131 copies/mL). A negative result must be combined with clinical observations, patient history, and epidemiological information. The expected result is Negative. Fact Sheet for Patients:  12/09/19 Fact Sheet for Healthcare Providers:  https://www.moore.com/ This test is not yet ap proved or cleared by the https://www.young.biz/ FDA and  has been authorized for detection and/or diagnosis of SARS-CoV-2 by FDA under an Emergency Use Authorization (EUA). This EUA will remain  in effect (meaning this test can be used) for the duration of the COVID-19 declaration under Section 564(b)(1) of the Act, 21 U.S.C. section 360bbb-3(b)(1), unless the authorization is terminated or revoked sooner.    Influenza A by PCR NEGATIVE NEGATIVE Final    Influenza B by PCR NEGATIVE NEGATIVE Final    Comment: (NOTE) The Xpert Xpress SARS-CoV-2/FLU/RSV assay is intended as an aid in  the diagnosis of influenza from Nasopharyngeal swab specimens and  should not be used as a sole basis for treatment. Nasal washings and  aspirates are unacceptable for Xpert Xpress SARS-CoV-2/FLU/RSV  testing. Fact Sheet for Patients: [place] Fact Sheet for Healthcare Providers: https://www.moore.com/ This test is not yet approved or cleared by the https://www.young.biz/ FDA and  has been authorized for detection and/or diagnosis of SARS-CoV-2 by  FDA under an Emergency Use Authorization (EUA). This EUA will remain  in effect (meaning this test can be used) for the duration of the  Covid-19 declaration under Section 564(b)(1) of the Act, 21  U.S.C. section 360bbb-3(b)(1), unless the authorization is  terminated or revoked. Performed at [facility], [address] [place], [place]   Surgical pcr screen     Status: None   Collection Time: 12/08/19  2:22 AM   Specimen: Nasal Mucosa; Nasal Swab  Result Value Ref Range Status   MRSA, PCR NEGATIVE NEGATIVE Final   Staphylococcus aureus NEGATIVE NEGATIVE Final    Comment: (NOTE) The Xpert SA Assay (FDA approved for NASAL specimens in patients 39 years of age  and older), is one component of a comprehensive surveillance program. It is not intended to diagnose infection nor to guide or monitor treatment. Performed at [facility], [address], [place], [place] [address]   SARS CORONAVIRUS 2 (TAT 6-24 HRS) Nasopharyngeal Nasopharyngeal Swab     Status: None   Collection Time: 12/12/19  4:20 PM   Specimen: Nasopharyngeal Swab  Result Value Ref Range Status   SARS Coronavirus 2 NEGATIVE NEGATIVE Final    Comment: (NOTE) SARS-CoV-2 target nucleic acids are NOT DETECTED. The SARS-CoV-2 RNA is generally detectable in upper and  lower respiratory specimens during the acute phase of infection. Negative results do not preclude SARS-CoV-2 infection, do not rule out co-infections with other pathogens, and should not be used as the sole basis for treatment or other patient management decisions. Negative results must be combined with clinical observations, patient history, and epidemiological information. The expected result is Negative. Fact Sheet for Patients: HairSlick.no Fact Sheet for Healthcare Providers: quierodirigir.com This test is not yet approved or cleared by the [place] FDA and  has been authorized for detection and/or diagnosis of SARS-CoV-2 by FDA under an Emergency Use Authorization (EUA). This EUA will remain  in effect (meaning this test can be used) for the duration of the COVID-19 declaration under Section 56 4(b)(1) of the Act, 21 U.S.C. section 360bbb-3(b)(1), unless the authorization is terminated or revoked sooner. Performed at [facility], [address], [place], [place] [address]   SARS CORONAVIRUS 2 (TAT 6-24 HRS) Nasopharyngeal Nasopharyngeal Swab     Status: None   Collection Time: 12/14/19  7:58 PM   Specimen: Nasopharyngeal Swab  Result Value Ref Range Status   SARS Coronavirus 2 NEGATIVE NEGATIVE Final    Comment: (NOTE) SARS-CoV-2 target nucleic acids are NOT DETECTED. The SARS-CoV-2 RNA is generally detectable in upper and lower respiratory specimens during the acute phase of infection. Negative results do not preclude SARS-CoV-2 infection, do not rule out co-infections with other pathogens, and should not be used as the sole basis for treatment or other patient management decisions. Negative results must be combined with clinical observations, patient history, and epidemiological information. The expected result is Negative. Fact Sheet for Patients: HairSlick.no Fact Sheet for  Healthcare Providers: quierodirigir.com This test is not yet approved or cleared by the [place] FDA and  has been authorized for detection and/or diagnosis of SARS-CoV-2 by FDA under an Emergency Use Authorization (EUA). This EUA will remain  in effect (meaning this test can be used) for the duration of the COVID-19 declaration under Section 56 4(b)(1) of the Act, 21 U.S.C. section 360bbb-3(b)(1), unless the authorization is terminated or revoked sooner. Performed at [facility], [address] [place], [place] [address]          Radiology Studies: No results found.      Scheduled Meds: . apixaban  5 mg Oral BID  . ezetimibe  10 mg Oral Daily  . ferrous sulfate  325 mg Oral TID PC  . furosemide  20 mg Oral Daily  . insulin aspart  0-20 Units Subcutaneous TID WC  . insulin aspart  0-5 Units Subcutaneous QHS  . insulin glargine  40 Units Subcutaneous QHS  . linagliptin  5 mg Oral Daily  . metFORMIN  500 mg Oral TID WC  . metoprolol succinate  25 mg Oral Daily  . mirabegron ER  50 mg Oral Daily  . pantoprazole  40 mg Oral Daily  .  pravastatin  40 mg Oral QHS  . ranolazine  500 mg Oral BID  . senna-docusate  1 tablet Oral BID   Continuous Infusions:   LOS: 9 days    Time spent: 25 minutes  Domenic Polite, MD [facility] 12/16/2019, 2:33 PM

## 2019-12-16 NOTE — TOC Progression Note (Signed)
Transition of Care [facility]) - Progression Note    Patient Details  Name: Kelly Lee MRN: [id number] Date of Birth: November 27, 1950  [facility]) CM/SW Contact  Truddie Hidden, LCSW Phone Number: 12/16/2019, 2:49 PM  Clinical Narrative:     [facility] leadership was able to connect with Tammy from [employer]. She states that patient will need insurance auth started. Tammy states that she anticipate she will have a bed available tomorrow. CSW started [place] with [employer]. Will need a new COVID test as previous one will be too old. CSW requested new COVID test be ordered. MD confirmed.  Anticipate discharge on 2/3. Patient aware and agreeable to this plan.   Expected Discharge Plan: Skilled Nursing Facility Barriers to Discharge: Continued Medical Work up, English as a second language teacher  Expected Discharge Plan and Services Expected Discharge Plan: Skilled Nursing Facility     Post Acute Care Choice: Skilled Nursing Facility Living arrangements for the past 2 months: Single Family Home                                       Social Determinants of Health (SDOH) Interventions    Readmission Risk Interventions Readmission Risk Prevention Plan 12/12/2019  Transportation Screening Complete  PCP or Specialist Appt within 5-7 Days Not Complete  Not Complete comments plan for SNF  Home Care Screening Complete  Medication Review (RN CM) Referral to Pharmacy

## 2019-12-16 NOTE — Plan of Care (Signed)

## 2019-12-16 NOTE — Clinical Social Work Note (Signed)
Attention [employer]  Please call Kelly Lee [phone] with insurance authorization details.  

## 2019-12-17 LAB — CBC
HCT: 26.8 % — ABNORMAL LOW (ref 36.0–46.0)
Hemoglobin: 8.5 g/dL — ABNORMAL LOW (ref 12.0–15.0)
MCH: 28 pg (ref 26.0–34.0)
MCHC: 31.7 g/dL (ref 30.0–36.0)
MCV: 88.2 fL (ref 80.0–100.0)
Platelets: 436 10*3/uL — ABNORMAL HIGH (ref 150–400)
RBC: 3.04 MIL/uL — ABNORMAL LOW (ref 3.87–5.11)
RDW: 15.2 % (ref 11.5–15.5)
WBC: 10.5 10*3/uL (ref 4.0–10.5)
nRBC: 0 % (ref 0.0–0.2)

## 2019-12-17 LAB — BASIC METABOLIC PANEL
Anion gap: 8 (ref 5–15)
BUN: 17 mg/dL (ref 8–23)
CO2: 28 mmol/L (ref 22–32)
Calcium: 9.3 mg/dL (ref 8.9–10.3)
Chloride: 99 mmol/L (ref 98–111)
Creatinine, Ser: 0.69 mg/dL (ref 0.44–1.00)
GFR calc Af Amer: >60 mL/min (ref >60)
GFR calc non Af Amer: >60 mL/min (ref >60)
Glucose, Bld: 124 mg/dL — ABNORMAL HIGH (ref 70–99)
Potassium: 4.7 mmol/L (ref 3.5–5.1)
Sodium: 135 mmol/L (ref 135–145)

## 2019-12-17 LAB — GLUCOSE, CAPILLARY
Glucose-Capillary: 86 mg/dL (ref 70–99)
Glucose-Capillary: 156 mg/dL — ABNORMAL HIGH (ref 70–99)

## 2019-12-17 MED ORDER — SENNOSIDES-DOCUSATE SODIUM 8.6-50 MG PO TABS: 1.0000 | ORAL_TABLET | Freq: Two times a day (BID) | ORAL | Status: DC

## 2019-12-17 MED ORDER — POLYETHYLENE GLYCOL 3350 17 G PO PACK: 17.0000 g | PACK | Freq: Every day | ORAL | 0 refills | Status: DC | PRN

## 2019-12-17 NOTE — Discharge Summary (Signed)
Physician Discharge Summary  Coral Spikesancy Gadea ZOX:[id number] DOB: 10/10/1951 DOA: 12/07/2019  PCP: Oval LinseyBritt, Janet, PA-C  Admit date: 12/07/2019 Discharge date: 12/17/2019  Time spent: 35minutes  Recommendations for Outpatient Follow-up:  1. Orthopedics Dr. Charlann Boxerlin in 2 weeks PCP in 1 week Partial weightbearing left lower extremity  Discharge Diagnoses:  Principal Problem:   Closed intertrochanteric fracture, left, initial encounter [facility](HCC) Active Problems:   Hypertension   Anticoagulant long-term use   Chronic respiratory failure with hypoxia (HCC)   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   Dyslipidemia   Hypothyroidism   Morbid obesity with BMI of 40.0-44.9, adult (HCC)   PAF (paroxysmal atrial fibrillation) (HCC)   Type 2 diabetes mellitus, with long-term current use of insulin (HCC)   Anemia   Discharge Condition: Stable  Diet recommendation: Diabetic, heart healthy  Filed Weights   12/07/19 1504 12/09/19 1117  Weight: 93 kg 93 kg    History of present illness:  Mrs. Kelly Lee is a 69 y.o. F with AF on Eliquis, DM, HTN, dCHF, and obesity who presented with fall. In the ER, found to have a left greater trochanter fracture.  Orthopedic surgery consulted underwent  ORIF 1/26 -Postop course complicated by anemia   Hospital Course:   Closed intertrochanteric fracture, left, -Orthopedics consulted, status post ORIF 1/26 -PT OT eval completed -SNF recommended for rehabilitation -Apixaban resumed -Partial weightbearing left lower extremity recommended at discharge -She will be discharged to SNF for rehabilitation today, follow-up with orthopedics Dr. Charlann Boxerlin in 2 weeks  Postop blood loss anemia -Transfused 2 units of PRBC -Hemoglobin stable and improved  Paroxysmal atrial fibrillation -In sinus rhythm at this time, continue metoprolol and Eliquis  Type 2 diabetes mellitus -CBGs in the 1 20-1 70  range -Continue Lantus 40 units, sliding scale and Metformin  Hypertension Chronic diastolic CHF Coronary disease  -Stable, continue metoprolol, statin, Zetia, furosemide   PROCEDURE:  Open reduction internal fixation, left intertrochanteric femur fracture utilizing a Biomet/Zimmer 320 x11 mm trochanteric entry Affixus nail with a 115 mm lag screw locked and a distal interlock.  SURGEON:  Durene RomansMatthew Olin, MD   Discharge Exam: Vitals:   12/17/19 0654 12/17/19 0657  BP: (!) 113/40 109/70  Pulse: 73 78  Resp:    Temp:    SpO2: 94% 96%    General: AAOx3 Cardiovascular: S1S2/RRR Respiratory: CTAB  Discharge Instructions   Discharge Instructions    Diet - low sodium heart healthy   Complete by: As directed    Partial weight bearing   Complete by: As directed    % Body Weight: 50   Laterality: left   Extremity: Lower     Allergies as of 12/17/2019      Reactions   Penicillins Hives, Shortness Of Breath   Azithromycin Hives   Drug Ingredient [cephalexin] Hives      Medication List    STOP taking these medications   aspirin 81 MG EC tablet     TAKE these medications   Eliquis 5 MG Tabs tablet Generic drug: apixaban Take 5 mg by mouth every 12 (twelve) hours.   ezetimibe 10 MG tablet Commonly known as: ZETIA Take 10 mg by mouth daily.   furosemide 40 MG tablet Commonly known as: LASIX Take 20 mg by mouth daily.   HYDROcodone-acetaminophen 5-325 MG tablet Commonly known as: Norco Take 1-2 tablets by mouth every 6 (six) hours as needed for moderate pain or severe pain.   Januvia 100 MG tablet Generic  drug: sitaGLIPtin Take 100 mg by mouth daily.   Lantus SoloStar 100 UNIT/ML Solostar Pen Generic drug: Insulin Glargine Inject 33 Units into the skin at bedtime.   metFORMIN 500 MG tablet Commonly known as: GLUCOPHAGE Take 500 mg by mouth 3 (three) times daily.   metoprolol succinate 50 MG 24 hr tablet Commonly known as: TOPROL-XL Take 25 mg by  mouth daily.   Myrbetriq 50 MG Tb24 tablet Generic drug: mirabegron ER Take 50 mg by mouth daily.   nitroGLYCERIN 0.4 MG SL tablet Commonly known as: NITROSTAT Place 0.4 mg under the tongue every 5 (five) minutes as needed for chest pain.   pantoprazole 40 MG tablet Commonly known as: PROTONIX Take 40 mg by mouth daily.   polyethylene glycol 17 g packet Commonly known as: MIRALAX / GLYCOLAX Take 17 g by mouth daily as needed for mild constipation.   pravastatin 40 MG tablet Commonly known as: PRAVACHOL Take 40 mg by mouth at bedtime.   ranolazine 500 MG 12 hr tablet Commonly known as: RANEXA Take 500 mg by mouth 2 (two) times daily.   senna-docusate 8.6-50 MG tablet Commonly known as: Senokot-S Take 1 tablet by mouth 2 (two) times daily.   tiZANidine 4 MG tablet Commonly known as: ZANAFLEX Take 4 mg by mouth 3 (three) times daily as needed for muscle spasms.            Discharge Care Instructions  (From admission, onward)         Start     Ordered   12/10/19 0000  Partial weight bearing    Question Answer Comment  % Body Weight 50   Laterality left   Extremity Lower      12/10/19 0753         Allergies  Allergen Reactions  . Penicillins Hives and Shortness Of Breath  . Azithromycin Hives  . Drug Ingredient [Cephalexin] Hives    Contact information for follow-up providers    Durene Romanslin, Matthew, MD. Schedule an appointment as soon as possible for a visit in 2 weeks.   Specialty: Orthopedic Surgery Contact information: [address] [place] [id number] [phone]            Contact information for after-discharge care    Destination    [facility] Preferred SNF .   Service: Skilled Nursing Contact information: [address] [place] [phone]                   The results of significant diagnostics from this hospitalization (including imaging, microbiology, ancillary  and laboratory) are listed below for reference.    Significant Diagnostic Studies: DG Chest 1 View  Result Date: 12/07/2019 CLINICAL DATA:  Status post fall. EXAM: CHEST  1 VIEW COMPARISON:  February 15, 2018 FINDINGS: Multiple sternal wires and vascular clips are seen. Mild to moderate severity diffusely increased interstitial lung markings are noted. There is no evidence of focal consolidation, pleural effusion or pneumothorax. The cardiac silhouette is markedly enlarged. Multiple left-sided rib fractures of indeterminate age are seen. Multilevel degenerative changes seen throughout the thoracic spine. IMPRESSION: 1. Evidence of prior median sternotomy/CABG. 2. Chronic appearing increased interstitial lung markings, without evidence of acute or active cardiopulmonary disease. 3. Multiple left-sided rib fractures of indeterminate age. These are not clearly identified on the prior study and may be acute in nature. Electronically Signed   By: Aram Candelahaddeus  Houston M.D.   On: 12/07/2019 16:48   DG Shoulder  Right  Result Date: 12/07/2019 CLINICAL DATA:  Status post fall. EXAM: RIGHT SHOULDER - 2+ VIEW COMPARISON:  None. FINDINGS: There is no evidence of fracture or dislocation. A single radiopaque surgical screw is seen overlying the right humeral head. Approximately 1.8 cm widening of the right acromioclavicular joint is noted. Soft tissues are unremarkable. IMPRESSION: 1. Postoperative changes without evidence of an acute osseous abnormality. Electronically Signed   By: Aram Candela M.D.   On: 12/07/2019 16:45   DG Knee Complete 4 Views Left  Result Date: 12/10/2019 CLINICAL DATA:  Left knee pain. The patient suffered a left hip fracture in a fall 12/07/2019. Initial encounter. EXAM: LEFT KNEE - COMPLETE 4+ VIEW COMPARISON:  None. FINDINGS: There is no acute bony or joint abnormality. The patient has advanced osteoarthritis about the knee which appears worst in the patellofemoral compartment.  Chondrocalcinosis is seen. No joint effusion. Distal end of an intramedullary nail in the femur and vascular clips in the medial soft tissues of the lower leg noted. IMPRESSION: No acute abnormality. Advanced tricompartmental osteoarthritis. Electronically Signed   By: Drusilla Kanner M.D.   On: 12/10/2019 15:40   DG C-Arm 1-60 Min  Result Date: 12/09/2019 CLINICAL DATA:  Malnutition femur, left femur IT fracture EXAM: DG C-ARM 1-60 MIN; LEFT FEMUR 2 VIEWS COMPARISON:  12/07/2019 FINDINGS: A series of fluoroscopic spot images document placement of IM rod and sliding screw across intertrochanteric femur fracture. IMPRESSION: Internal fixation of left intertrochanteric femur fracture. Electronically Signed   By: Corlis Leak M.D.   On: 12/09/2019 13:46   DG Hip Unilat With Pelvis 2-3 Views Left  Result Date: 12/07/2019 CLINICAL DATA:  Status post fall. EXAM: DG HIP (WITH OR WITHOUT PELVIS) 2-3V LEFT COMPARISON:  None. FINDINGS: Acute fracture deformity is seen extending through the inter trochanteric region of the proximal left femur. There is superior and lateral displacement of the distal fracture site with superior angulation of the fracture apex. There is no evidence of dislocation. Soft tissue structures are unremarkable. IMPRESSION: 1. Acute inter trochanteric fracture of the proximal left femur. Electronically Signed   By: Aram Candela M.D.   On: 12/07/2019 16:44   DG FEMUR MIN 2 VIEWS LEFT  Result Date: 12/09/2019 CLINICAL DATA:  Malnutition femur, left femur IT fracture EXAM: DG C-ARM 1-60 MIN; LEFT FEMUR 2 VIEWS COMPARISON:  12/07/2019 FINDINGS: A series of fluoroscopic spot images document placement of IM rod and sliding screw across intertrochanteric femur fracture. IMPRESSION: Internal fixation of left intertrochanteric femur fracture. Electronically Signed   By: Corlis Leak M.D.   On: 12/09/2019 13:46    Microbiology: Recent Results (from the past 240 hour(s))  Respiratory Panel by RT  PCR (Flu A&B, Covid) - Nasopharyngeal Swab     Status: None   Collection Time: 12/07/19  4:53 PM   Specimen: Nasopharyngeal Swab  Result Value Ref Range Status   SARS Coronavirus 2 by RT PCR NEGATIVE NEGATIVE Final    Comment: (NOTE) SARS-CoV-2 target nucleic acids are NOT DETECTED. The SARS-CoV-2 RNA is generally detectable in upper respiratoy specimens during the acute phase of infection. The lowest concentration of SARS-CoV-2 viral copies this assay can detect is 131 copies/mL. A negative result does not preclude SARS-Cov-2 infection and should not be used as the sole basis for treatment or other patient management decisions. A negative result may occur with  improper specimen collection/handling, submission of specimen other than nasopharyngeal swab, presence of viral mutation(s) within the areas targeted by this assay, and  inadequate number of viral copies (<131 copies/mL). A negative result must be combined with clinical observations, patient history, and epidemiological information. The expected result is Negative. Fact Sheet for Patients:  https://www.moore.com/ Fact Sheet for Healthcare Providers:  https://www.young.biz/ This test is not yet ap proved or cleared by the [place] FDA and  has been authorized for detection and/or diagnosis of SARS-CoV-2 by FDA under an Emergency Use Authorization (EUA). This EUA will remain  in effect (meaning this test can be used) for the duration of the COVID-19 declaration under Section 564(b)(1) of the Act, 21 U.S.C. section 360bbb-3(b)(1), unless the authorization is terminated or revoked sooner.    Influenza A by PCR NEGATIVE NEGATIVE Final   Influenza B by PCR NEGATIVE NEGATIVE Final    Comment: (NOTE) The Xpert Xpress SARS-CoV-2/FLU/RSV assay is intended as an aid in  the diagnosis of influenza from Nasopharyngeal swab specimens and  should not be used as a sole basis for treatment. Nasal  washings and  aspirates are unacceptable for Xpert Xpress SARS-CoV-2/FLU/RSV  testing. Fact Sheet for Patients: https://www.moore.com/ Fact Sheet for Healthcare Providers: https://www.young.biz/ This test is not yet approved or cleared by the [place] FDA and  has been authorized for detection and/or diagnosis of SARS-CoV-2 by  FDA under an Emergency Use Authorization (EUA). This EUA will remain  in effect (meaning this test can be used) for the duration of the  Covid-19 declaration under Section 564(b)(1) of the Act, 21  U.S.C. section 360bbb-3(b)(1), unless the authorization is  terminated or revoked. Performed at [facility], [address] [place], [place] [address]   Surgical pcr screen     Status: None   Collection Time: 12/08/19  2:22 AM   Specimen: Nasal Mucosa; Nasal Swab  Result Value Ref Range Status   MRSA, PCR NEGATIVE NEGATIVE Final   Staphylococcus aureus NEGATIVE NEGATIVE Final    Comment: (NOTE) The Xpert SA Assay (FDA approved for NASAL specimens in patients 65 years of age and older), is one component of a comprehensive surveillance program. It is not intended to diagnose infection nor to guide or monitor treatment. Performed at [facility], [address] [place], [place] [address]   SARS CORONAVIRUS 2 (TAT 6-24 HRS) Nasopharyngeal Nasopharyngeal Swab     Status: None   Collection Time: 12/12/19  4:20 PM   Specimen: Nasopharyngeal Swab  Result Value Ref Range Status   SARS Coronavirus 2 NEGATIVE NEGATIVE Final    Comment: (NOTE) SARS-CoV-2 target nucleic acids are NOT DETECTED. The SARS-CoV-2 RNA is generally detectable in upper and lower respiratory specimens during the acute phase of infection. Negative results do not preclude SARS-CoV-2 infection, do not rule out co-infections with other pathogens, and should not be used as the sole basis for treatment or other patient management  decisions. Negative results must be combined with clinical observations, patient history, and epidemiological information. The expected result is Negative. Fact Sheet for Patients: HairSlick.no Fact Sheet for Healthcare Providers: quierodirigir.com This test is not yet approved or cleared by the [place] FDA and  has been authorized for detection and/or diagnosis of SARS-CoV-2 by FDA under an Emergency Use Authorization (EUA). This EUA will remain  in effect (meaning this test can be used) for the duration of the COVID-19 declaration under Section 56 4(b)(1) of the Act, 21 U.S.C. section 360bbb-3(b)(1), unless the authorization is terminated or revoked sooner. Performed at [facility], [address] [place], [place] [address]  SARS CORONAVIRUS 2 (TAT 6-24 HRS) Nasopharyngeal Nasopharyngeal Swab     Status: None   Collection Time: 12/14/19  7:58 PM   Specimen: Nasopharyngeal Swab  Result Value Ref Range Status   SARS Coronavirus 2 NEGATIVE NEGATIVE Final    Comment: (NOTE) SARS-CoV-2 target nucleic acids are NOT DETECTED. The SARS-CoV-2 RNA is generally detectable in upper and lower respiratory specimens during the acute phase of infection. Negative results do not preclude SARS-CoV-2 infection, do not rule out co-infections with other pathogens, and should not be used as the sole basis for treatment or other patient management decisions. Negative results must be combined with clinical observations, patient history, and epidemiological information. The expected result is Negative. Fact Sheet for Patients: HairSlick.no Fact Sheet for Healthcare Providers: quierodirigir.com This test is not yet approved or cleared by the [place] FDA and  has been authorized for detection and/or diagnosis of SARS-CoV-2 by FDA under an Emergency Use Authorization (EUA). This  EUA will remain  in effect (meaning this test can be used) for the duration of the COVID-19 declaration under Section 56 4(b)(1) of the Act, 21 U.S.C. section 360bbb-3(b)(1), unless the authorization is terminated or revoked sooner. Performed at [facility], [address] [place], [place] [address]   SARS CORONAVIRUS 2 (TAT 6-24 HRS) Nasopharyngeal Nasopharyngeal Swab     Status: None   Collection Time: 12/16/19  2:50 PM   Specimen: Nasopharyngeal Swab  Result Value Ref Range Status   SARS Coronavirus 2 NEGATIVE NEGATIVE Final    Comment: (NOTE) SARS-CoV-2 target nucleic acids are NOT DETECTED. The SARS-CoV-2 RNA is generally detectable in upper and lower respiratory specimens during the acute phase of infection. Negative results do not preclude SARS-CoV-2 infection, do not rule out co-infections with other pathogens, and should not be used as the sole basis for treatment or other patient management decisions. Negative results must be combined with clinical observations, patient history, and epidemiological information. The expected result is Negative. Fact Sheet for Patients: HairSlick.no Fact Sheet for Healthcare Providers: quierodirigir.com This test is not yet approved or cleared by the [place] FDA and  has been authorized for detection and/or diagnosis of SARS-CoV-2 by FDA under an Emergency Use Authorization (EUA). This EUA will remain  in effect (meaning this test can be used) for the duration of the COVID-19 declaration under Section 56 4(b)(1) of the Act, 21 U.S.C. section 360bbb-3(b)(1), unless the authorization is terminated or revoked sooner. Performed at [facility], [address] [place], [place] [address]      Labs: Basic Metabolic Panel: Recent Labs  Lab 12/11/19 0117 12/12/19 0305 12/13/19 0510 12/17/19 0347  NA 131* 136 135 135  K 4.6 4.3 4.1 4.7  CL 99 99 97* 99  CO2 26 29  28 28   GLUCOSE 288* 196* 106* 124*  BUN 20 18 16 17   CREATININE 0.80 0.64 0.68 0.69  CALCIUM 8.1* 8.8* 8.8* 9.3  MG 1.7  --   --   --    Liver Function Tests: No results for input(s): AST, ALT, ALKPHOS, BILITOT, PROT, ALBUMIN in the last 168 hours. No results for input(s): LIPASE, AMYLASE in the last 168 hours. No results for input(s): AMMONIA in the last 168 hours. CBC: Recent Labs  Lab 12/11/19 0117 12/11/19 1531 12/12/19 0305 12/13/19 0510 12/17/19 0347  WBC 9.5 13.3* 11.5* 10.7* 10.5  HGB 5.3* 8.6* 7.9* 8.3* 8.5*  HCT 16.3* 26.1* 24.3* 25.4* 26.8*  MCV 89.6 86.1 86.5 87.6  88.2  PLT 161 234 238 263 436*   Cardiac Enzymes: No results for input(s): CKTOTAL, CKMB, CKMBINDEX, TROPONINI in the last 168 hours. BNP: BNP (last 3 results) No results for input(s): BNP in the last 8760 hours.  ProBNP (last 3 results) No results for input(s): PROBNP in the last 8760 hours.  CBG: Recent Labs  Lab 12/16/19 1218 12/16/19 1646 12/16/19 2104 12/17/19 0649 12/17/19 1122  GLUCAP 151* 107* 161* 86 156*       Signed:  Domenic Polite MD.  [facility] 12/17/2019, 12:16 PM

## 2019-12-17 NOTE — Discharge Instructions (Signed)

## 2019-12-17 NOTE — Plan of Care (Signed)
  Problem: Education: Goal: Knowledge of General Education information will improve Outcome: Progressing   Problem: Activity: Goal: Risk for activity intolerance will decrease Outcome: Progressing   Problem: Pain Managment: Goal: General experience of comfort will improve Outcome: Progressing   Problem: Safety: Goal: Ability to remain free from injury will improve Outcome: Progressing   Problem: Skin Integrity: Goal: Risk for impaired skin integrity will decrease Outcome: Progressing   

## 2019-12-17 NOTE — Progress Notes (Signed)
Physical Therapy Treatment Patient Details Name: Kelly Lee MRN: [id number] DOB: Oct 21, 1951 Today's Date: 12/17/2019    History of Present Illness Pt is a 69 y/o female admitted after falling at home in which she sustained a complex, comminuted peri-trochanteric L hip fracture and R shoulder contusion. Pt is now s/p IM nail. PMH including but not limited to a-fib, CHF, DM and HTN.    PT Comments    Pt making fair progress with functional mobility. She remains very limited secondary to pain and weakness of L LE. Plan is to d/c to SNF today. Pt would continue to benefit from skilled physical therapy services at this time while admitted and after d/c to address the below listed limitations in order to improve overall safety and independence with functional mobility.    Follow Up Recommendations  SNF;Supervision for mobility/OOB     Equipment Recommendations  Other (comment)(defer to next venue of care)    Recommendations for Other Services       Precautions / Restrictions Precautions Precautions: Fall Restrictions Weight Bearing Restrictions: Yes LLE Weight Bearing: Partial weight bearing LLE Partial Weight Bearing Percentage or Pounds: 50%    Mobility  Bed Mobility Overal bed mobility: Needs Assistance Bed Mobility: Supine to Sit;Sit to Supine     Supine to sit: Min assist;HOB elevated Sit to supine: Min assist   General bed mobility comments: min assist with HOB elevated, use of rail and assist to move LLE, increased time  Transfers                 General transfer comment: pt declining  Ambulation/Gait                 Stairs             Wheelchair Mobility    Modified Rankin (Stroke Patients Only)       Balance Overall balance assessment: Needs assistance;History of Falls Sitting-balance support: No upper extremity supported Sitting balance-Leahy Scale: Fair                                      Cognition  Arousal/Alertness: Awake/alert Behavior During Therapy: WFL for tasks assessed/performed Overall Cognitive Status: Within Functional Limits for tasks assessed                                        Exercises General Exercises - Lower Extremity Ankle Circles/Pumps: AROM;Both;20 reps;Supine Quad Sets: AROM;Strengthening;Left;10 reps;Supine Long Arc Quad: AROM;AAROM;Left;10 reps;Seated Heel Slides: AAROM;Left;10 reps;Supine Hip ABduction/ADduction: AAROM;Left;10 reps;Supine Straight Leg Raises: AAROM;Left;10 reps;Supine    General Comments        Pertinent Vitals/Pain Pain Assessment: Faces Faces Pain Scale: Hurts even more Pain Location: L hip Pain Descriptors / Indicators: Guarding;Aching Pain Intervention(s): Monitored during session;Repositioned    Home Living                      Prior Function            PT Goals (current goals can now be found in the care plan section) Acute Rehab PT Goals PT Goal Formulation: With patient Time For Goal Achievement: 12/24/19 Potential to Achieve Goals: Good Progress towards PT goals: Progressing toward goals    Frequency    Min 3X/week      PT Plan Current plan  remains appropriate    Co-evaluation              AM-PAC PT "6 Clicks" Mobility   Outcome Measure  Help needed turning from your back to your side while in a flat bed without using bedrails?: A Little Help needed moving from lying on your back to sitting on the side of a flat bed without using bedrails?: A Little Help needed moving to and from a bed to a chair (including a wheelchair)?: Total Help needed standing up from a chair using your arms (e.g., wheelchair or bedside chair)?: A Lot Help needed to walk in hospital room?: Total Help needed climbing 3-5 steps with a railing? : Total 6 Click Score: 11    End of Session   Activity Tolerance: Patient tolerated treatment well Patient left: in bed;with call bell/phone within  reach;with bed alarm set Nurse Communication: Mobility status;Precautions;Weight bearing status PT Visit Diagnosis: Other abnormalities of gait and mobility (R26.89);Pain;Muscle weakness (generalized) (M62.81) Pain - Right/Left: Left Pain - part of body: Hip     Time: 3500-9381 PT Time Calculation (min) (ACUTE ONLY): 33 min  Charges:  $Therapeutic Exercise: 8-22 mins $Therapeutic Activity: 8-22 mins                     Anastasio Champion, DPT  Acute Rehabilitation Services Pager [phone] Office [place] 12/17/2019, 3:40 PM

## 2019-12-17 NOTE — Progress Notes (Signed)
Pt report given to receiving RN at [facility];Pt in stable condition;awaiting for PTAR for transfer.

## 2019-12-17 NOTE — TOC Transition Note (Signed)
Transition of Care [facility]) - CM/SW Discharge Note   Patient Details  Name: Kelly Lee MRN: [id number] Date of Birth: 02-21-1951  Transition of Care [facility]) CM/SW Contact:  Truddie Hidden, LCSW Phone Number: 12/17/2019, 1:29 PM   Clinical Narrative:     Discharged to [facility]. Referral coordinated with Tammy. Number to call report [phone] given to unit RN Harvie Heck. Patient aware and agreeable to this plan. No other needs at this time. Case closed to this CSW. Final next level of care: Skilled Nursing Facility Barriers to Discharge: Barriers Resolved   Patient Goals and CMS Choice Patient states their goals for this hospitalization and ongoing recovery are:: feel better, not be in so much pain CMS Medicare.gov Compare Post Acute Care list provided to:: Patient Choice offered to / list presented to : Patient  Discharge Placement              Patient chooses bed at: [facility] [place] Patient to be transferred to facility by: [facility] Name of family member notified: patient Patient and family notified of of transfer: 12/17/19  Discharge Plan and Services     Post Acute Care Choice: Skilled Nursing Facility                               Social Determinants of Health (SDOH) Interventions     Readmission Risk Interventions Readmission Risk Prevention Plan 12/12/2019  Transportation Screening Complete  PCP or Specialist Appt within 5-7 Days Not Complete  Not Complete comments plan for SNF  Home Care Screening Complete  Medication Review (RN CM) Referral to Pharmacy

## 2019-12-17 NOTE — Care Management Important Message (Signed)
Important Message  Patient Details  Name: Kelly Lee MRN: [id number] Date of Birth: January 23, 1951   Medicare Important Message Given:  Yes     Mardene Sayer 12/17/2019, 12:36 PM

## 2021-12-14 ENCOUNTER — Emergency Department (HOSPITAL_COMMUNITY): Payer: Medicare Other | Admitting: Certified Registered"

## 2021-12-14 ENCOUNTER — Inpatient Hospital Stay (HOSPITAL_COMMUNITY)
Admission: EM | Admit: 2021-12-14 | Discharge: 2021-12-21 | DRG: 253 | Disposition: A | Discharge status: Skilled Nursing Facility | Payer: Medicare Other | Attending: Vascular Surgery | Admitting: Vascular Surgery

## 2021-12-14 ENCOUNTER — Emergency Department (HOSPITAL_COMMUNITY): Payer: Medicare Other

## 2021-12-14 ENCOUNTER — Other Ambulatory Visit: Payer: Self-pay

## 2021-12-14 ENCOUNTER — Encounter (HOSPITAL_COMMUNITY): Payer: Self-pay

## 2021-12-14 ENCOUNTER — Encounter (HOSPITAL_COMMUNITY)
Admission: EM | Disposition: A | Discharge status: Skilled Nursing Facility | Payer: Self-pay | Source: Home / Self Care | Attending: Vascular Surgery

## 2021-12-14 DIAGNOSIS — R0789 Other chest pain: Secondary | ICD-10-CM | POA: Diagnosis present

## 2021-12-14 DIAGNOSIS — Z794 Long term (current) use of insulin: Secondary | ICD-10-CM

## 2021-12-14 DIAGNOSIS — E119 Type 2 diabetes mellitus without complications: Secondary | ICD-10-CM

## 2021-12-14 DIAGNOSIS — I480 Paroxysmal atrial fibrillation: Secondary | ICD-10-CM | POA: Diagnosis present

## 2021-12-14 DIAGNOSIS — Z951 Presence of aortocoronary bypass graft: Secondary | ICD-10-CM

## 2021-12-14 DIAGNOSIS — Z8701 Personal history of pneumonia (recurrent): Secondary | ICD-10-CM

## 2021-12-14 DIAGNOSIS — Z79899 Other long term (current) drug therapy: Secondary | ICD-10-CM

## 2021-12-14 DIAGNOSIS — I998 Other disorder of circulatory system: Secondary | ICD-10-CM | POA: Diagnosis not present

## 2021-12-14 DIAGNOSIS — I25810 Atherosclerosis of coronary artery bypass graft(s) without angina pectoris: Secondary | ICD-10-CM | POA: Diagnosis present

## 2021-12-14 DIAGNOSIS — I743 Embolism and thrombosis of arteries of the lower extremities: Secondary | ICD-10-CM | POA: Diagnosis not present

## 2021-12-14 DIAGNOSIS — I10 Essential (primary) hypertension: Secondary | ICD-10-CM | POA: Diagnosis present

## 2021-12-14 DIAGNOSIS — M79604 Pain in right leg: Secondary | ICD-10-CM | POA: Diagnosis not present

## 2021-12-14 DIAGNOSIS — Z6841 Body Mass Index (BMI) 40.0 and over, adult: Secondary | ICD-10-CM

## 2021-12-14 DIAGNOSIS — J9611 Chronic respiratory failure with hypoxia: Secondary | ICD-10-CM | POA: Diagnosis present

## 2021-12-14 DIAGNOSIS — Z880 Allergy status to penicillin: Secondary | ICD-10-CM

## 2021-12-14 DIAGNOSIS — L7632 Postprocedural hematoma of skin and subcutaneous tissue following other procedure: Secondary | ICD-10-CM | POA: Diagnosis not present

## 2021-12-14 DIAGNOSIS — E1165 Type 2 diabetes mellitus with hyperglycemia: Secondary | ICD-10-CM | POA: Diagnosis present

## 2021-12-14 DIAGNOSIS — R001 Bradycardia, unspecified: Secondary | ICD-10-CM | POA: Diagnosis present

## 2021-12-14 DIAGNOSIS — D649 Anemia, unspecified: Secondary | ICD-10-CM | POA: Diagnosis present

## 2021-12-14 DIAGNOSIS — L97329 Non-pressure chronic ulcer of left ankle with unspecified severity: Secondary | ICD-10-CM | POA: Diagnosis present

## 2021-12-14 DIAGNOSIS — E11622 Type 2 diabetes mellitus with other skin ulcer: Secondary | ICD-10-CM | POA: Diagnosis present

## 2021-12-14 DIAGNOSIS — Y838 Other surgical procedures as the cause of abnormal reaction of the patient, or of later complication, without mention of misadventure at the time of the procedure: Secondary | ICD-10-CM | POA: Diagnosis not present

## 2021-12-14 DIAGNOSIS — L8990 Pressure ulcer of unspecified site, unspecified stage: Secondary | ICD-10-CM | POA: Diagnosis present

## 2021-12-14 DIAGNOSIS — I70221 Atherosclerosis of native arteries of extremities with rest pain, right leg: Secondary | ICD-10-CM | POA: Diagnosis not present

## 2021-12-14 DIAGNOSIS — Z8616 Personal history of COVID-19: Secondary | ICD-10-CM

## 2021-12-14 DIAGNOSIS — E0590 Thyrotoxicosis, unspecified without thyrotoxic crisis or storm: Secondary | ICD-10-CM | POA: Diagnosis present

## 2021-12-14 DIAGNOSIS — I252 Old myocardial infarction: Secondary | ICD-10-CM

## 2021-12-14 DIAGNOSIS — I110 Hypertensive heart disease with heart failure: Secondary | ICD-10-CM | POA: Diagnosis present

## 2021-12-14 DIAGNOSIS — I70229 Atherosclerosis of native arteries of extremities with rest pain, unspecified extremity: Secondary | ICD-10-CM | POA: Diagnosis present

## 2021-12-14 DIAGNOSIS — I70209 Unspecified atherosclerosis of native arteries of extremities, unspecified extremity: Secondary | ICD-10-CM

## 2021-12-14 DIAGNOSIS — I959 Hypotension, unspecified: Secondary | ICD-10-CM | POA: Diagnosis not present

## 2021-12-14 DIAGNOSIS — D62 Acute posthemorrhagic anemia: Secondary | ICD-10-CM | POA: Diagnosis not present

## 2021-12-14 DIAGNOSIS — Z881 Allergy status to other antibiotic agents status: Secondary | ICD-10-CM

## 2021-12-14 DIAGNOSIS — I2510 Atherosclerotic heart disease of native coronary artery without angina pectoris: Secondary | ICD-10-CM | POA: Diagnosis present

## 2021-12-14 DIAGNOSIS — I5032 Chronic diastolic (congestive) heart failure: Secondary | ICD-10-CM | POA: Diagnosis present

## 2021-12-14 DIAGNOSIS — I429 Cardiomyopathy, unspecified: Secondary | ICD-10-CM

## 2021-12-14 HISTORY — PX: THROMBECTOMY FEMORAL ARTERY: SHX6406

## 2021-12-14 HISTORY — PX: FASCIOTOMY: SHX132

## 2021-12-14 LAB — RESP PANEL BY RT-PCR (FLU A&B, COVID) ARPGX2
Influenza A by PCR: NEGATIVE
Influenza B by PCR: NEGATIVE
SARS Coronavirus 2 by RT PCR: NEGATIVE

## 2021-12-14 LAB — BASIC METABOLIC PANEL
Anion gap: 10 (ref 5–15)
BUN: 11 mg/dL (ref 8–23)
CO2: 26 mmol/L (ref 22–32)
Calcium: 8.7 mg/dL — ABNORMAL LOW (ref 8.9–10.3)
Chloride: 100 mmol/L (ref 98–111)
Creatinine, Ser: 1.08 mg/dL — ABNORMAL HIGH (ref 0.44–1.00)
GFR, Estimated: 55 mL/min — ABNORMAL LOW (ref >60)
Glucose, Bld: 126 mg/dL — ABNORMAL HIGH (ref 70–99)
Potassium: 3.2 mmol/L — ABNORMAL LOW (ref 3.5–5.1)
Sodium: 136 mmol/L (ref 135–145)

## 2021-12-14 LAB — CBC WITH DIFFERENTIAL/PLATELET
Abs Immature Granulocytes: 0.04 10*3/uL (ref 0.00–0.07)
Basophils Absolute: 0.1 10*3/uL (ref 0.0–0.1)
Basophils Relative: 1 %
Eosinophils Absolute: 0.2 10*3/uL (ref 0.0–0.5)
Eosinophils Relative: 2 %
HCT: 36.9 % (ref 36.0–46.0)
Hemoglobin: 11.6 g/dL — ABNORMAL LOW (ref 12.0–15.0)
Immature Granulocytes: 0 %
Lymphocytes Relative: 23 %
Lymphs Abs: 2.2 10*3/uL (ref 0.7–4.0)
MCH: 28.6 pg (ref 26.0–34.0)
MCHC: 31.4 g/dL (ref 30.0–36.0)
MCV: 91.1 fL (ref 80.0–100.0)
Monocytes Absolute: 0.6 10*3/uL (ref 0.1–1.0)
Monocytes Relative: 7 %
Neutro Abs: 6.6 10*3/uL (ref 1.7–7.7)
Neutrophils Relative %: 67 %
Platelets: 310 10*3/uL (ref 150–400)
RBC: 4.05 MIL/uL (ref 3.87–5.11)
RDW: 14.6 % (ref 11.5–15.5)
WBC: 9.7 10*3/uL (ref 4.0–10.5)
nRBC: 0 % (ref 0.0–0.2)

## 2021-12-14 LAB — I-STAT CHEM 8, ED
BUN: 13 mg/dL (ref 8–23)
Calcium, Ion: 1.1 mmol/L — ABNORMAL LOW (ref 1.15–1.40)
Chloride: 98 mmol/L (ref 98–111)
Creatinine, Ser: 1 mg/dL (ref 0.44–1.00)
Glucose, Bld: 149 mg/dL — ABNORMAL HIGH (ref 70–99)
HCT: 32 % — ABNORMAL LOW (ref 36.0–46.0)
Hemoglobin: 10.9 g/dL — ABNORMAL LOW (ref 12.0–15.0)
Potassium: 3.2 mmol/L — ABNORMAL LOW (ref 3.5–5.1)
Sodium: 140 mmol/L (ref 135–145)
TCO2: 30 mmol/L (ref 22–32)

## 2021-12-14 SURGERY — THROMBECTOMY, ARTERY, FEMORAL
Anesthesia: General | Site: Leg Upper | Laterality: Right

## 2021-12-14 MED ORDER — 0.9 % SODIUM CHLORIDE (POUR BTL) OPTIME
TOPICAL | Status: DC | PRN
  Administered 2021-12-14: 2000 mL

## 2021-12-14 MED ORDER — SUGAMMADEX SODIUM 200 MG/2ML IV SOLN
INTRAVENOUS | Status: DC | PRN
  Administered 2021-12-14: 150 mg via INTRAVENOUS

## 2021-12-14 MED ORDER — PROPOFOL 10 MG/ML IV BOLUS
INTRAVENOUS | Status: DC | PRN
  Administered 2021-12-14: 80 mg via INTRAVENOUS

## 2021-12-14 MED ORDER — PROPOFOL 10 MG/ML IV BOLUS
INTRAVENOUS | Status: AC
  Filled 2021-12-14: qty 20

## 2021-12-14 MED ORDER — LIDOCAINE 2% (20 MG/ML) 5 ML SYRINGE
INTRAMUSCULAR | Status: DC | PRN
  Administered 2021-12-14: 60 mg via INTRAVENOUS

## 2021-12-14 MED ORDER — ROCURONIUM BROMIDE 10 MG/ML (PF) SYRINGE
PREFILLED_SYRINGE | INTRAVENOUS | Status: AC
  Filled 2021-12-14: qty 10

## 2021-12-14 MED ORDER — EPHEDRINE SULFATE-NACL 50-0.9 MG/10ML-% IV SOSY
PREFILLED_SYRINGE | INTRAVENOUS | Status: DC | PRN
  Administered 2021-12-14: 5 mg via INTRAVENOUS

## 2021-12-14 MED ORDER — CLINDAMYCIN PHOSPHATE 900 MG/50ML IV SOLN
900.0000 mg | INTRAVENOUS | Status: AC
  Administered 2021-12-14: 900 mg via INTRAVENOUS
  Filled 2021-12-14: qty 50

## 2021-12-14 MED ORDER — FENTANYL CITRATE (PF) 250 MCG/5ML IJ SOLN
INTRAMUSCULAR | Status: AC
  Filled 2021-12-14: qty 5

## 2021-12-14 MED ORDER — HEPARIN (PORCINE) 25000 UT/250ML-% IV SOLN
1350.0000 [IU]/h | INTRAVENOUS | Status: DC
  Administered 2021-12-14: 1350 [IU]/h via INTRAVENOUS
  Filled 2021-12-14: qty 250

## 2021-12-14 MED ORDER — HEPARIN 6000 UNIT IRRIGATION SOLUTION
Status: DC | PRN
  Administered 2021-12-14: 1

## 2021-12-14 MED ORDER — DEXAMETHASONE SODIUM PHOSPHATE 10 MG/ML IJ SOLN
INTRAMUSCULAR | Status: DC | PRN
  Administered 2021-12-14: 4 mg via INTRAVENOUS

## 2021-12-14 MED ORDER — PHENYLEPHRINE 40 MCG/ML (10ML) SYRINGE FOR IV PUSH (FOR BLOOD PRESSURE SUPPORT)
PREFILLED_SYRINGE | INTRAVENOUS | Status: DC | PRN
  Administered 2021-12-14 (×3): 40 ug via INTRAVENOUS

## 2021-12-14 MED ORDER — ONDANSETRON HCL 4 MG/2ML IJ SOLN
INTRAMUSCULAR | Status: AC
  Filled 2021-12-14: qty 2

## 2021-12-14 MED ORDER — FENTANYL CITRATE PF 50 MCG/ML IJ SOSY
50.0000 ug | PREFILLED_SYRINGE | Freq: Once | INTRAMUSCULAR | Status: AC
  Administered 2021-12-14: 50 ug via INTRAVENOUS
  Filled 2021-12-14: qty 1

## 2021-12-14 MED ORDER — DEXAMETHASONE SODIUM PHOSPHATE 10 MG/ML IJ SOLN
INTRAMUSCULAR | Status: AC
  Filled 2021-12-14: qty 1

## 2021-12-14 MED ORDER — EPHEDRINE 5 MG/ML INJ
INTRAVENOUS | Status: AC
  Filled 2021-12-14: qty 5

## 2021-12-14 MED ORDER — IOHEXOL 350 MG/ML SOLN
125.0000 mL | Freq: Once | INTRAVENOUS | Status: AC | PRN
  Administered 2021-12-14: 125 mL via INTRAVENOUS

## 2021-12-14 MED ORDER — FENTANYL CITRATE (PF) 250 MCG/5ML IJ SOLN
INTRAMUSCULAR | Status: DC | PRN
  Administered 2021-12-14: 25 ug via INTRAVENOUS

## 2021-12-14 MED ORDER — HEPARIN SODIUM (PORCINE) 1000 UNIT/ML IJ SOLN
INTRAMUSCULAR | Status: DC | PRN
  Administered 2021-12-14: 8000 [IU] via INTRAVENOUS

## 2021-12-14 MED ORDER — ROCURONIUM BROMIDE 10 MG/ML (PF) SYRINGE
PREFILLED_SYRINGE | INTRAVENOUS | Status: DC | PRN
  Administered 2021-12-14: 20 mg via INTRAVENOUS

## 2021-12-14 MED ORDER — SUCCINYLCHOLINE CHLORIDE 200 MG/10ML IV SOSY
PREFILLED_SYRINGE | INTRAVENOUS | Status: AC
  Filled 2021-12-14: qty 10

## 2021-12-14 MED ORDER — SUCCINYLCHOLINE CHLORIDE 200 MG/10ML IV SOSY
PREFILLED_SYRINGE | INTRAVENOUS | Status: DC | PRN
  Administered 2021-12-14: 80 mg via INTRAVENOUS

## 2021-12-14 MED ORDER — HEPARIN BOLUS VIA INFUSION
4000.0000 [IU] | Freq: Once | INTRAVENOUS | Status: AC
Start: 2021-12-14 — End: 2021-12-14
  Administered 2021-12-14: 4000 [IU] via INTRAVENOUS
  Filled 2021-12-14: qty 4000

## 2021-12-14 MED ORDER — PHENYLEPHRINE 40 MCG/ML (10ML) SYRINGE FOR IV PUSH (FOR BLOOD PRESSURE SUPPORT)
PREFILLED_SYRINGE | INTRAVENOUS | Status: AC
  Filled 2021-12-14: qty 10

## 2021-12-14 SURGICAL SUPPLY — 66 items
ADH SKN CLS APL DERMABOND .7 (GAUZE/BANDAGES/DRESSINGS) ×2
BAG COUNTER SPONGE SURGICOUNT (BAG) ×3 IMPLANT
BAG SPNG CNTER NS LX DISP (BAG) ×2
BANDAGE ESMARK 6X9 LF (GAUZE/BANDAGES/DRESSINGS) IMPLANT
BNDG CMPR 9X6 STRL LF SNTH (GAUZE/BANDAGES/DRESSINGS)
BNDG CMPR MED 10X6 ELC LF (GAUZE/BANDAGES/DRESSINGS) ×2
BNDG ELASTIC 4X5.8 VLCR STR LF (GAUZE/BANDAGES/DRESSINGS) ×1 IMPLANT
BNDG ELASTIC 6X10 VLCR STRL LF (GAUZE/BANDAGES/DRESSINGS) ×1 IMPLANT
BNDG ESMARK 6X9 LF (GAUZE/BANDAGES/DRESSINGS)
BNDG GAUZE ELAST 4 BULKY (GAUZE/BANDAGES/DRESSINGS) ×1 IMPLANT
CANISTER SUCT 3000ML PPV (MISCELLANEOUS) ×3 IMPLANT
CATH EMB 4FR 80CM (CATHETERS) ×1 IMPLANT
CNTNR URN SCR LID CUP LEK RST (MISCELLANEOUS) IMPLANT
CONT SPEC 4OZ STRL OR WHT (MISCELLANEOUS) ×3
CUFF TOURN SGL QUICK 24 (TOURNIQUET CUFF)
CUFF TOURN SGL QUICK 34 (TOURNIQUET CUFF)
CUFF TOURN SGL QUICK 42 (TOURNIQUET CUFF) IMPLANT
CUFF TRNQT CYL 24X4X16.5-23 (TOURNIQUET CUFF) IMPLANT
CUFF TRNQT CYL 34X4.125X (TOURNIQUET CUFF) IMPLANT
DERMABOND ADVANCED (GAUZE/BANDAGES/DRESSINGS) ×1
DERMABOND ADVANCED .7 DNX12 (GAUZE/BANDAGES/DRESSINGS) ×2 IMPLANT
DRAIN CHANNEL 15F RND FF W/TCR (WOUND CARE) IMPLANT
DRAPE X-RAY CASS 24X20 (DRAPES) IMPLANT
DRESSING PEEL AND PLC PRVNA 13 (GAUZE/BANDAGES/DRESSINGS) IMPLANT
DRSG PAD ABDOMINAL 8X10 ST (GAUZE/BANDAGES/DRESSINGS) ×1 IMPLANT
DRSG PEEL AND PLACE PREVENA 13 (GAUZE/BANDAGES/DRESSINGS) ×3
ELECT REM PT RETURN 9FT ADLT (ELECTROSURGICAL) ×3
ELECTRODE REM PT RTRN 9FT ADLT (ELECTROSURGICAL) ×2 IMPLANT
EVACUATOR SILICONE 100CC (DRAIN) IMPLANT
GAUZE 4X4 16PLY ~~LOC~~+RFID DBL (SPONGE) ×1 IMPLANT
GAUZE SPONGE 4X4 12PLY STRL LF (GAUZE/BANDAGES/DRESSINGS) ×1 IMPLANT
GLOVE SURG POLYISO LF SZ7.5 (GLOVE) ×3 IMPLANT
GLOVE SURG UNDER POLY LF SZ7.5 (GLOVE) ×3 IMPLANT
GOWN STRL REUS W/ TWL LRG LVL3 (GOWN DISPOSABLE) ×4 IMPLANT
GOWN STRL REUS W/ TWL XL LVL3 (GOWN DISPOSABLE) ×2 IMPLANT
GOWN STRL REUS W/TWL LRG LVL3 (GOWN DISPOSABLE) ×6
GOWN STRL REUS W/TWL XL LVL3 (GOWN DISPOSABLE) ×3
HEMOSTAT SNOW SURGICEL 2X4 (HEMOSTASIS) IMPLANT
KIT BASIN OR (CUSTOM PROCEDURE TRAY) ×3 IMPLANT
KIT DRSG PREVENA PLUS 7DAY 125 (MISCELLANEOUS) ×1 IMPLANT
KIT TURNOVER KIT B (KITS) ×3 IMPLANT
MARKER GRAFT CORONARY BYPASS (MISCELLANEOUS) IMPLANT
NS IRRIG 1000ML POUR BTL (IV SOLUTION) ×6 IMPLANT
PACK PERIPHERAL VASCULAR (CUSTOM PROCEDURE TRAY) ×3 IMPLANT
PAD ARMBOARD 7.5X6 YLW CONV (MISCELLANEOUS) ×6 IMPLANT
SET COLLECT BLD 21X3/4 12 (NEEDLE) IMPLANT
SPONGE T-LAP 18X18 ~~LOC~~+RFID (SPONGE) ×1 IMPLANT
STAPLER VISISTAT 35W (STAPLE) ×1 IMPLANT
STOPCOCK 4 WAY LG BORE MALE ST (IV SETS) IMPLANT
SUT ETHILON 3 0 PS 1 (SUTURE) IMPLANT
SUT PROLENE 5 0 C 1 24 (SUTURE) ×3 IMPLANT
SUT PROLENE 6 0 BV (SUTURE) ×10 IMPLANT
SUT PROLENE 7 0 BV 1 (SUTURE) IMPLANT
SUT SILK 2 0 SH (SUTURE) ×2 IMPLANT
SUT SILK 3 0 (SUTURE)
SUT SILK 3-0 18XBRD TIE 12 (SUTURE) IMPLANT
SUT VIC AB 2-0 CT1 27 (SUTURE) ×6
SUT VIC AB 2-0 CT1 TAPERPNT 27 (SUTURE) ×4 IMPLANT
SUT VIC AB 3-0 SH 27 (SUTURE) ×3
SUT VIC AB 3-0 SH 27X BRD (SUTURE) ×4 IMPLANT
SUT VICRYL 4-0 PS2 18IN ABS (SUTURE) ×5 IMPLANT
TOWEL GREEN STERILE (TOWEL DISPOSABLE) ×3 IMPLANT
TRAY FOLEY MTR SLVR 16FR STAT (SET/KITS/TRAYS/PACK) ×3 IMPLANT
TUBING EXTENTION W/L.L. (IV SETS) IMPLANT
UNDERPAD 30X36 HEAVY ABSORB (UNDERPADS AND DIAPERS) ×3 IMPLANT
WATER STERILE IRR 1000ML POUR (IV SOLUTION) ×3 IMPLANT

## 2021-12-14 NOTE — Op Note (Addendum)
DATE OF SERVICE: 12/14/2021  PATIENT:  Kelly Lee  71 y.o. female  PRE-OPERATIVE DIAGNOSIS:  acute limb ischemia of right lower extremity  POST-OPERATIVE DIAGNOSIS:  Same  PROCEDURE:   1) right lower extremity arterial thrombectomy 2) right calf four compartment fasciotomy  SURGEON:  Surgeon(s) and Role:    * Cherre Robins, MD - Primary  ASSISTANT: Leontine Locket, PA-C  An experienced assistant was required given the complexity of this procedure and the standard of surgical care. My assistant helped with exposure through counter tension, suctioning, ligation and retraction to better visualize the surgical field.  My assistant expedited sewing during the case by following my sutures. Wherever I use the term "we" in the report, my assistant actively helped me with that portion of the procedure.  ANESTHESIA:   general  EBL: 156m  BLOOD ADMINISTERED:none  DRAINS: none   LOCAL MEDICATIONS USED:  NONE  SPECIMEN: Thrombus for pathologic evaluation  COUNTS: confirmed correct.  TOURNIQUET:  none  PATIENT DISPOSITION:  PACU - hemodynamically stable.   Delay start of Pharmacological VTE agent (>24hrs) due to surgical blood loss or risk of bleeding: no  INDICATION FOR PROCEDURE: Kelly Massarois a 71y.o. female with acute right lower extremity arterial thrombosis causing Rutherford 2B acute limb ischemia.  A CT angiogram was performed prior to surgery and revealed a saddle thrombus across the femoral bifurcation. After careful discussion of risks, benefits, and alternatives the patient was offered thrombectomy and Fasciotomy. We specifically discussed risk of limb loss. The patient understood and wished to proceed.  OPERATIVE FINDINGS: Acute thrombus retrieved from right profunda and superficial femoral artery.  Heavily scarred groin consistent with prior cardiac catheterizations.  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  DESCRIPTION OF PROCEDURE: After identification of the patient in the pre-operative holding area, the patient was transferred to the operating room. The patient was positioned supine on the operating room table. Anesthesia was induced. The right lower extremity was prepped and draped in standard fashion. A surgical pause was performed confirming correct patient, procedure, and operative location.  Longitudinal incision was made over the course of the right common femoral artery and its bifurcation.  This is carried down through subtenons tissue.  Dense scar was encountered in the groin.  There is significant lymphadenopathy.  This was carefully dissected through to avoid a lymph leak.  The superficial femoral artery was identified first.  This was skeletonized throughout 6 cm.  We carried the exposure cranially and identify the common femoral artery.  The profunda femoris artery was encased in a dense rind of scar.  This was sharply dissected away.  The patient was systemically heparinized.  Clamps were applied to the common femoral artery, profunda femoris artery, and superficial femoral artery.  A transverse arteriotomy was made on the common femoral artery.  #4 Fogarty embolectomy balloon catheter was directed down the superficial femoral artery.  The catheter was passed until resistance was met.  The catheter was then gently withdrawn using gentle balloon inflation to perform thrombectomy.  2 passes were performed, both retrieving thrombus.  On our third pass no thrombus returned.  Good backbleeding was noted.  The process was then repeated down the profunda femoris artery.  1 pass retrieved a fair amount of acute appearing thrombus.  Very brisk backbleeding was then achieved.  This was likely a combination of restoring arterial patency along with the known profunda femoral vein arteriovenous fistula.  An additional pass  was made on the profunda femoris artery.  No thrombus  returned.  The thrombus was sent for pathologic evaluation.  The arteriotomy was then closed using interrupted 6-0 Prolene suture.  Hemostasis was achieved.  Clamps were released sequentially on the femoral arteries.  The foot was then interrogated with a Doppler machine.  The color of the foot was grossly improved.  Brisk capillary refill was noted.  A palpable dorsalis pedis pulse was noted.  This is confirmed with Doppler.   4 compartment fasciotomy was then performed.  A medial right calf incision was made with a 10 blade.  This is carried down subtenons tissue until the superficial posterior fascia was encountered and divided.  The muscle did bulge out of the fascia.  The muscle fasciculated when interrogated with Bovie.  I then took down the iliotibial attachments and into the deep posterior compartment.  This was divided.  This muscle did not bulge.  This muscle was viable when interrogated with Bovie.  Next a lateral right calf incision was made with a 10 blade this was carried down through subcutaneous tissue until the anterior and lateral fascia was identified.  These were divided with Bovie electrocautery.  The muscle of the anterior and lateral compartments bulged out of the release fascia.  I extended the fasciotomies with a Metzenbaum scissor.  The skin was easily closed over the fasciotomies.  The skin was closed loosely with interrupted staples.  The groin was then closed in layers using 2-0 Vicryl, and a surgical stapler.  A Prevena was applied to the groin.  A bulky dressing was applied to the right calf.  Upon completion of the case instrument and sharps counts were confirmed correct. The patient was transferred to the PACU in good condition. I was present for all portions of the procedure.  Kelly Aline. Stanford Breed, MD [facility] Phone Number: [phone] 12/14/2021 11:54 PM

## 2021-12-14 NOTE — Progress Notes (Signed)
ANTICOAGULATION CONSULT NOTE - Initial Consult  Pharmacy Consult for heparin Indication:  Acute arterial occlusion  Allergies  Allergen Reactions   Penicillins Hives and Shortness Of Breath   Azithromycin Hives   Drug Ingredient [Cephalexin] Hives    Patient Measurements: Height: 5\' 1"  (154.9 cm) Weight: 86.2 kg (190 lb) IBW/kg (Calculated) : 47.8 Heparin Dosing Weight: 86 kg   Vital Signs: Temp: 98.2 F (36.8 C) (02/01 1930) BP: 128/113 (02/01 1930) Pulse Rate: 83 (02/01 1930)  Labs: No results for input(s): HGB, HCT, PLT, APTT, LABPROT, INR, HEPARINUNFRC, HEPRLOWMOCWT, CREATININE, CKTOTAL, CKMB, TROPONINIHS in the last 72 hours.  CrCl cannot be calculated (Patient's most recent lab result is older than the maximum 21 days allowed.).   Medical History: Past Medical History:  Diagnosis Date   Diabetes mellitus without complication ([place])    Hypertension     Medications:  (Not in a hospital admission)   Assessment: 61 YOF with pain in her R leg and loss of pulses in extremity concerning for acute arterial occlusion. Pharmacy consulted to start IV heparin.   H/H low, Plt wnl.   Goal of Therapy:  Heparin level 0.3-0.7 units/ml Monitor platelets by anticoagulation protocol: Yes   Plan:  -Heparin 4000 units IV bolus followed by heparin infusion at 1350 units/hr -F/u 8 hr HL -Monitor daily HL, CBC and s/s of bleeding   Albertina Parr, PharmD., BCCCP Clinical Pharmacist Please refer to [facility] for unit-specific pharmacist

## 2021-12-14 NOTE — ED Provider Notes (Signed)
[place] EMERGENCY DEPARTMENT Provider Note   CSN: [id number] Arrival date & time:        History  Chief Complaint  Patient presents with   Extremity Weakness    Cool to the touch, loss of pulses in extremity    Leg Pain    Shawn Eisenstadt is a 71 y.o. female with PMHx HTN, Diabetes, Hypothyroidism, A fib who presents to the ED today via EMS with complaint of sudden onset, constant, R leg pain that began about 1.5 hours PTA. When EMS arrived they were unable to locate a pulse in the RLE. Pt reports she has numbness in the RLE as well. She reports issues with her femoral artery in the past and states she was told she could not have another cath in this area however is unsure exactly why. Pt with hx of A fib and multiple stents - currently on Plavix.   Per chart review:  Office visit on 06/03/2021 with vascular surgeon Dr. 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 Given she was not having any symptoms no further treatment was needed at that time.   The history is provided by the patient and medical records.      Home Medications Prior to Admission medications   Medication Sig Start Date End Date Taking? Authorizing Provider  albuterol (VENTOLIN HFA) 108 (90 Base) MCG/ACT inhaler Inhale 2 puffs into the lungs every 6 (six) hours as needed for wheezing or shortness of breath. 12/06/21  Yes [provider]  clopidogrel (PLAVIX) 75 MG tablet Take 75 mg by mouth daily.   Yes [provider]  empagliflozin (JARDIANCE) 10 MG TABS tablet Take 10 mg by mouth daily.   Yes [provider]  ezetimibe (ZETIA) 10 MG tablet Take 10 mg by mouth daily. 11/16/19  Yes [provider]  fluticasone (FLONASE) 50 MCG/ACT nasal spray Place 1 spray into both nostrils daily as needed for allergies. 06/25/20  Yes [provider]  furosemide (LASIX) 40 MG tablet Take 20 mg by mouth daily.  10/17/19  Yes [provider]  LANTUS SOLOSTAR 100 UNIT/ML Solostar Pen Inject 35 Units into the skin at bedtime. 11/21/19  Yes [provider]  metFORMIN (GLUCOPHAGE) 500 MG tablet Take 500 mg by mouth 3 (three) times daily. 11/16/19  Yes [provider]  methimazole (TAPAZOLE) 5 MG tablet Take 5 mg by mouth daily. 10/15/21  Yes [provider]  metoprolol (TOPROL-XL) 200 MG 24 hr tablet Take 200 mg by mouth daily.   Yes [provider]  MYRBETRIQ 50 MG TB24 tablet Take 50 mg by mouth daily. 10/09/19  Yes [provider]  nitroGLYCERIN (NITROSTAT) 0.4 MG SL tablet Place 0.4 mg under the tongue every 5 (five) minutes as needed for chest pain.    Yes [provider]  nystatin (MYCOSTATIN/NYSTOP) powder Apply 1 application topically 3 (three) times daily as needed (rash).   Yes [provider]  pantoprazole (PROTONIX) 40 MG tablet Take 40 mg by mouth daily. 10/17/19  Yes [provider]  pravastatin (PRAVACHOL) 40 MG tablet Take 40 mg by mouth at bedtime. 11/19/19  Yes [provider]  spironolactone (ALDACTONE) 25 MG tablet Take 12.5 mg by mouth daily. 10/05/21  Yes [provider]  tiZANidine (ZANAFLEX) 4 MG tablet Take 4 mg by mouth 3 (three) times daily as needed for muscle spasms.  11/13/19  Yes [provider]  HYDROcodone-acetaminophen (NORCO) 5-325 MG tablet Take  1-2 tablets by mouth every 6 (six) hours as needed for moderate pain or severe pain. Patient not taking: Reported on 12/14/2021 12/10/19   Danae Orleans, PA-C  polyethylene glycol (MIRALAX / GLYCOLAX) 17 g packet Take 17 g by mouth daily as needed for mild constipation. Patient not taking: Reported on 12/14/2021 12/17/19   Domenic Polite, MD  senna-docusate (SENOKOT-S) 8.6-50 MG tablet Take 1 tablet by mouth 2 (two) times daily. Patient not taking: Reported on 12/14/2021 12/17/19   Domenic Polite, MD      Allergies    Penicillins, Azithromycin, and Drug ingredient  [cephalexin]    Review of Systems   Review of Systems  Constitutional:  Negative for chills and fever.  Musculoskeletal:  Positive for arthralgias and extremity weakness.  Skin:  Positive for pallor.  Neurological:  Positive for numbness.  All other systems reviewed and are negative.  Physical Exam Updated Vital Signs BP (!) 104/50    Pulse 64    Temp 98.2 F (36.8 C)    Resp 12    Ht 5\' 1"  (1.549 m)    Wt 86.2 kg    SpO2 100%    BMI 35.90 kg/m  Physical Exam Vitals and nursing note reviewed.  Constitutional:      Appearance: She is not ill-appearing.  HENT:     Head: Normocephalic and atraumatic.  Eyes:     Conjunctiva/sclera: Conjunctivae normal.  Cardiovascular:     Rate and Rhythm: Normal rate and regular rhythm.     Pulses: Normal pulses.  Pulmonary:     Effort: Pulmonary effort is normal.     Breath sounds: Normal breath sounds. No wheezing, rhonchi or rales.  Abdominal:     Palpations: Abdomen is soft.     Tenderness: There is no abdominal tenderness.  Musculoskeletal:     Cervical back: Neck supple.     Comments: RLE mottled and cold to the touch compared to the LLE. Unable to palpate DP, PT, popliteal, or femoral pulse. No dopplerable pulse identified. Bedside ultrasound with findings of R femoral artery patent.   Skin:    General: Skin is warm and dry.  Neurological:     Mental Status: She is alert.    ED Results / Procedures / Treatments   Labs (all labs ordered are listed, but only abnormal results are displayed) Labs Reviewed  CBC WITH DIFFERENTIAL/PLATELET - Abnormal; Notable for the following components:      Result Value   Hemoglobin 11.6 (*)    All other components within normal limits  BASIC METABOLIC PANEL - Abnormal; Notable for the following components:   Potassium 3.2 (*)    Glucose, Bld 126 (*)    Creatinine, Ser 1.08 (*)    Calcium 8.7 (*)    GFR, Estimated 55 (*)    All other components within normal limits  I-STAT CHEM 8, ED -  Abnormal; Notable for the following components:   Potassium 3.2 (*)    Glucose, Bld 149 (*)    Calcium, Ion 1.10 (*)    Hemoglobin 10.9 (*)    HCT 32.0 (*)    All other components within normal limits  RESP PANEL BY RT-PCR (FLU A&B, COVID) ARPGX2  HEPARIN LEVEL (UNFRACTIONATED)  CBC    EKG EKG Interpretation  Date/Time:  Wednesday December 14 2021 19:24:46 EST Ventricular Rate:  44 PR Interval:  159 QRS Duration: 103 QT Interval:  471 QTC Calculation: 403 R Axis:   52 Text Interpretation: Sinus rhythm  frequent  pvcs Confirmed by Davonna Belling [phone]) on 12/14/2021 8:14:23 PM  Radiology CT ANGIO AO+BIFEM W & OR WO CONTRAST  Result Date: 12/14/2021 CLINICAL DATA:  Right lower extremity weakness and pain, rule out arterial embolism EXAM: CT ANGIOGRAPHY OF ABDOMINAL AORTA WITH ILIOFEMORAL RUNOFF TECHNIQUE: Multidetector CT imaging of the abdomen, pelvis and lower extremities was performed using the standard protocol during bolus administration of intravenous contrast. Multiplanar CT image reconstructions and MIPs were obtained to evaluate the vascular anatomy. RADIATION DOSE REDUCTION: This exam was performed according to the departmental dose-optimization program which includes automated exposure control, adjustment of the mA and/or kV according to patient size and/or use of iterative reconstruction technique. CONTRAST:  182mL OMNIPAQUE IOHEXOL 350 MG/ML SOLN COMPARISON:  CT 11/22/2020 FINDINGS: VASCULAR Aorta: Minimal calcified atheromatous plaque in the infrarenal segment. No aneurysm, dissection, or stenosis. Celiac: Patent without evidence of aneurysm, dissection, vasculitis or significant stenosis. SMA: Patent without evidence of aneurysm, dissection, vasculitis or significant stenosis. Replaced right hepatic arterial supply, an anatomic variant. Renals: Duplicated left, superior dominant, both patent. Single right, widely patent. IMA: Patent without evidence of aneurysm, dissection,  vasculitis or significant stenosis. RIGHT Lower Extremity Inflow: Minimal calcified plaque. No aneurysm, dissection, or stenosis. Outflow: Common femoral distal embolic occlusion, new since previous, extending across the bifurcation into proximal deep femoral and superficial femoral branches Deep femoral reconstitution less than 1 cm beyond its origin; muscular branches opacified to the distal thigh level. SFA reconstitution less than 1 cm beyond its origin. There is gradual decrease in the degree of contrast enhancement of the lumen extending approximately 11 cm from the origin, non-opacified distally Popliteal artery is also thrombosed proximally, with reconstitution by collaterals above the knee, only a small opacified channel noted distally to the expected level of its bifurcation. Runoff: Non-opacified LEFT Lower Extremity Inflow: Common, internal and external iliac arteries are patent without evidence of aneurysm, dissection, vasculitis or significant stenosis. Outflow: Common, superficial and profunda femoral arteries are patent without evidence of aneurysm, dissection, vasculitis or significant stenosis. Popliteal artery is patent with some eccentric nonocclusive plaque just above the. Runoff: Patent three-vessel runoff is opacified to the distal calf level. Veins: Early opacification of the central right femoral vein at approximately the level of the right common femoral artery bifurcation, consistent with clinical history of AV fistula. Some contrast reflux into hepatic veins from the dilated right atrium suggesting a degree of right heart dysfunction. Review of the MIP images confirms the above findings. NON-VASCULAR Lower chest: No pleural or pericardial  effusion. Hepatobiliary: No focal liver abnormality is seen. Status post cholecystectomy. No biliary dilatation. Pancreas: Unremarkable. No pancreatic ductal dilatation or surrounding inflammatory changes. Spleen: Normal in size without focal  abnormality. Adrenals/Urinary Tract: Adrenal glands are unremarkable. Kidneys are normal, without renal calculi, focal lesion, or hydronephrosis. Bladder is unremarkable. Stomach/Bowel: The stomach is incompletely distended, unremarkable. Small bowel decompressed. Appendix not identified. The colon is nondilated proximally. There is mild fecal distention of the rectum. No wall thickening or adjacent inflammatory change. Lymphatic: No abdominal or pelvic adenopathy. Reproductive: Status post hysterectomy. No adnexal masses. Other: No ascites.  No free air. Musculoskeletal: L3 compression fracture deformity with less than 20% loss of height, new since previous. Stable mild T12 compression deformity. Left femur IM rod with sliding screw across the femoral neck, and distal interlocking screw as before. IM rod in the right tibia with distal interlocking screw as before. Orthopedic hardware in the left medial and lateral malleoli. IMPRESSION: 1. Probable acute, near-occlusive embolus  in the distal RIGHT common femoral artery extending a short distance into the deep femoral and SFA branches. 2. Long segment occlusion of the RIGHT SFA from the mid thigh through the proximal popliteal artery. 3. Reconstitution of a small patent channel in the RIGHT proximal popliteal artery to its bifurcation, with no opacification of tibial runoff. 4. 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 Critical Value/emergent results were called by telephone at the time of interpretation on 12/14/2021 at 9:51 pm to provider Davonna Belling MD, who verbally acknowledged these results. 5. No significant LEFT lower extremity arterial occlusive disease to the distal calf, incomplete vascular opacification distally possibly due to scan timing. 6. Subacute L3 compression fracture Electronically Signed   By: Lucrezia Europe M.D.   On: 12/14/2021 21:52    Procedures .Critical  Care Performed by: Eustaquio Maize, PA-C Authorized by: Eustaquio Maize, PA-C   Critical care provider statement:    Critical care time (minutes):  35   Critical care was necessary to treat or prevent imminent or life-threatening deterioration of the following conditions:  Circulatory failure   Critical care was time spent personally by me on the following activities:  Development of treatment plan with patient or surrogate, discussions with consultants, evaluation of patient's response to treatment, examination of patient, ordering and review of laboratory studies, ordering and review of radiographic studies, ordering and performing treatments and interventions, pulse oximetry, re-evaluation of patient's condition and review of old charts    Medications Ordered in ED Medications  heparin ADULT infusion 100 units/mL (25000 units/227mL) (1,350 Units/hr Intravenous New Bag/Given 12/14/21 2028)  fentaNYL (SUBLIMAZE) injection 50 mcg (50 mcg Intravenous Given 12/14/21 2015)  heparin bolus via infusion 4,000 Units (4,000 Units Intravenous Bolus from Bag 12/14/21 2029)  iohexol (OMNIPAQUE) 350 MG/ML injection 125 mL (125 mLs Intravenous Contrast Given 12/14/21 2104)    ED Course/ Medical Decision Making/ A&P                           Medical Decision Making 71 year old female presenting to the ED today with complaint of sudden onset pain and numbness to her RLE 1.5 hours PTA. EMS unable to palpate pulse. EKG on arrival without signs of A fib however pt with hx of same - on plavix. On exam she has cold and mottled RLE without dopplerable or palpable pulses. Attending physician Dr. Alvino Chapel at bedside with bedside ultrasound - able to locate right femoral artery on ultrasound that does appear patent.   CT Angio RLE ordered at this time. Vascular surgery to see patient.   Prior to CTA returning pt taken to OR for thrombectomy. Attending physician Dr. Alvino Chapel received call from radiologist regarding  findings consistent with acute arterial occlusion.   Amount and/or Complexity of Data Reviewed External Data Reviewed: notes.    Details: Hx of R femoral AV fistula. Followed with vascular surgery this past summer however sypmtomatic at that time and no treatment required. Labs: ordered. Radiology: ordered. Discussion of management or test interpretation with external provider(s): Discussed case with vascular surgeon Dr. Stanford Breed who agrees with CTA RLE, heparin bolus, and will come see patient.   Risk Prescription drug management. Emergency major surgery.  Critical Care Total time providing critical care: 30-74 minutes          Final Clinical Impression(s) / ED Diagnoses Final diagnoses:  Arterial occlusion, lower extremity ([place])  Rx / DC Orders ED Discharge Orders     None         Eustaquio Maize, Hershal Coria 12/14/21 2213    Davonna Belling, MD 12/15/21 [phone]

## 2021-12-14 NOTE — Anesthesia Preprocedure Evaluation (Addendum)
Anesthesia Evaluation  Patient identified by MRN, date of birth, ID band Patient awake    Reviewed: Allergy & Precautions, NPO status , Patient's Chart, lab work & pertinent test results  History of Anesthesia Complications Negative for: history of anesthetic complications  Airway Mallampati: II  TM Distance: >3 FB Neck ROM: Full    Dental  (+) Dental Advisory Given   Pulmonary asthma ,  2L O2 [place]   Pulmonary exam normal breath sounds clear to auscultation       Cardiovascular hypertension, Pt. on home beta blockers and Pt. on medications (-) angina+ CAD, + Past MI, + Cardiac Stents and + CABG (2000)  Normal cardiovascular exam+ dysrhythmias Atrial Fibrillation  Rhythm:Regular Rate:Normal     Neuro/Psych negative neurological ROS  negative psych ROS   GI/Hepatic Neg liver ROS, GERD  Medicated,  Endo/Other  diabetes, Type 2, Oral Hypoglycemic Agents, Insulin DependentHypothyroidism Obesity   Renal/GU negative Renal ROS     Musculoskeletal negative musculoskeletal ROS (+)   Abdominal (+) + obese,   Peds  Hematology  (+) Blood dyscrasia (Eliquis), anemia ,   Anesthesia Other Findings Day of surgery medications reviewed with the patient.  Reproductive/Obstetrics                            Anesthesia Physical  Anesthesia Plan  ASA: 3 and emergent  Anesthesia Plan: General   Post-op Pain Management: Ofirmev IV (intra-op)   Induction: Intravenous, Rapid sequence and Cricoid pressure planned  PONV Risk Score and Plan: 3 and Midazolam, Dexamethasone, Ondansetron and Treatment may vary due to age or medical condition  Airway Management Planned: Oral ETT  Additional Equipment: Arterial line  Intra-op Plan:   Post-operative Plan: Extubation in OR  Informed Consent: I have reviewed the patients History and Physical, chart, labs and discussed the procedure including the risks, benefits  and alternatives for the proposed anesthesia with the patient or authorized representative who has indicated his/her understanding and acceptance.     Dental advisory given  Plan Discussed with: CRNA  Anesthesia Plan Comments: (2 x PIV)       Anesthesia Quick Evaluation

## 2021-12-14 NOTE — ED Notes (Signed)
Pts daughter updated per pt request  

## 2021-12-14 NOTE — Anesthesia Procedure Notes (Signed)
Arterial Line Insertion Start/End2/11/2021 10:15 PM, 12/14/2021 10:35 PM Performed by: Lewie Loron, MD, Sonda Primes, CRNA, anesthesiologist  Patient location: Pre-op. Preanesthetic checklist: patient identified, IV checked, site marked, risks and benefits discussed, surgical consent, monitors and equipment checked, pre-op evaluation, timeout performed and anesthesia consent Lidocaine 1% used for infiltration Left, radial was placed Catheter size: 20 G Hand hygiene performed , maximum sterile barriers used  and Seldinger technique used  Attempts: 5 or more Procedure performed using ultrasound guided technique. Ultrasound Notes:anatomy identified, needle tip was noted to be adjacent to the nerve/plexus identified, no ultrasound evidence of intravascular and/or intraneural injection and image(s) printed for medical record Following insertion, dressing applied and Biopatch. Post procedure assessment: normal and unchanged  Post procedure complications: local hematoma, second provider assisted and unsuccessful attempts. Patient tolerated the procedure well with no immediate complications.

## 2021-12-14 NOTE — ED Triage Notes (Addendum)
Pt BIB for pain in her Rt leg, the pain turned to numbness  about an 1.5 hours aho and there was no pulse in the lower extremity with EMS.  Leg was cool to the touch. No pulse pedal or popilteal. Decreasese sensation and motor.  25 mcg fentyl IM PTA  Afib  97%  60HR 158/74 CBG 166

## 2021-12-14 NOTE — ED Notes (Signed)
Pt transported to CT ?

## 2021-12-14 NOTE — Anesthesia Procedure Notes (Signed)
Procedure Name: Intubation Date/Time: 12/14/2021 10:02 PM Performed by: Sammie Bench, CRNA Pre-anesthesia Checklist: Patient identified, Emergency Drugs available, Suction available and Patient being monitored Patient Re-evaluated:Patient Re-evaluated prior to induction Oxygen Delivery Method: Circle System Utilized Preoxygenation: Pre-oxygenation with 100% oxygen Induction Type: IV induction Ventilation: Mask ventilation without difficulty Laryngoscope Size: Mac and 3 Grade View: Grade I Tube type: Oral Tube size: 7.0 mm Number of attempts: 1 Airway Equipment and Method: Stylet and Oral airway Placement Confirmation: ETT inserted through vocal cords under direct vision, positive ETCO2 and breath sounds checked- equal and bilateral Secured at: 21 cm Tube secured with: Tape Dental Injury: Teeth and Oropharynx as per pre-operative assessment

## 2021-12-14 NOTE — H&P (Signed)
[facility] [place]  ASSESSMENT / PLAN: 71 y.o. female with acute limb ischemia of the right lower extremity ([place] 2B). The patient needs urgent revascularization. I counseled her that even with successful revascularization she may have limb loss. Will get CT angiogram urgently given my concern for acute on chronic disease and weak femoral pulse. After CT angiogram will go to OR for right lower extremity thrombectomy, calf fasciotomy and possible angiography. NPO. Check COVID swab. Orders for consent written.  CHIEF COMPLAINT: right leg pain  HISTORY OF PRESENT ILLNESS: Kelly Lee is a 71 y.o. female who presents to [facility] ER for evaluation of a 2 hour history of severe right leg pain, weakness, and numbness. The patient has never had symptoms like this before. 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 Gerald Dexter 06-03-22 who recommended observation). She was admitted about a year ago with severe COVID pneumonia. She was previously on Eliquis for paroxysmal AFIB (Feb 2021), but does not appear to be taking this now and was not taking this when she saw Dr. Gerald Dexter.   Past Medical History:  Diagnosis Date   Diabetes mellitus without complication ([place])    Hypertension   Diastolic heart failure Paroxysmal Afib   Past Surgical History:  Procedure Laterality Date   CARDIAC CATHETERIZATION     CHOLECYSTECTOMY     FEMUR IM NAIL Left 12/09/2019   Procedure: INTRAMEDULLARY (IM) NAIL FEMORAL;  Surgeon: Paralee Cancel, MD;  Location: [facility];  Service: Orthopedics;  Laterality: Left;  Coronary artery bypass  History reviewed. No pertinent family history.  Social History   Socioeconomic History   Marital status: Widowed    Spouse name: Not on file   Number of children: Not on file   Years of education: Not on file   Highest education level: Not on file  Occupational History   Not on file   Tobacco Use   Smoking status: Never   Smokeless tobacco: Never  Substance and Sexual Activity   Alcohol use: Not on file   Drug use: Never   Sexual activity: Not on file  Other Topics Concern   Not on file  Social History Narrative   Not on file   Social Determinants of Health   Financial Resource Strain: Not on file  Food Insecurity: Not on file  Transportation Needs: Not on file  Physical Activity: Not on file  Stress: Not on file  Social Connections: Not on file  Intimate Partner Violence: Not on file    Allergies  Allergen Reactions   Penicillins Hives and Shortness Of Breath   Azithromycin Hives   Drug Ingredient [Cephalexin] Hives    Current Facility-Administered Medications  Medication Dose Route Frequency Provider Last Rate Last Admin   heparin ADULT infusion 100 units/mL (25000 units/228mL)  1,350 Units/hr Intravenous Continuous Lavenia Atlas, RPH 13.5 mL/hr at 12/14/21 2028 1,350 Units/hr at 12/14/21 2028   Current Outpatient Medications  Medication Sig Dispense Refill   albuterol (VENTOLIN HFA) 108 (90 Base) MCG/ACT inhaler Inhale 2 puffs into the lungs every 6 (six) hours as needed for wheezing or shortness of breath.     clopidogrel (PLAVIX) 75 MG tablet Take 75 mg by mouth daily.     empagliflozin (JARDIANCE) 10 MG TABS tablet Take 10 mg by mouth daily.     ezetimibe (ZETIA) 10 MG tablet Take 10 mg by mouth daily.     fluticasone (FLONASE) 50 MCG/ACT nasal spray  Place 1 spray into both nostrils daily as needed for allergies.     furosemide (LASIX) 40 MG tablet Take 20 mg by mouth daily.      LANTUS SOLOSTAR 100 UNIT/ML Solostar Pen Inject 35 Units into the skin at bedtime.     metFORMIN (GLUCOPHAGE) 500 MG tablet Take 500 mg by mouth 3 (three) times daily.     methimazole (TAPAZOLE) 5 MG tablet Take 5 mg by mouth daily.     metoprolol (TOPROL-XL) 200 MG 24 hr tablet Take 200 mg by mouth daily.     MYRBETRIQ 50 MG TB24 tablet Take 50 mg by mouth  daily.     nitroGLYCERIN (NITROSTAT) 0.4 MG SL tablet Place 0.4 mg under the tongue every 5 (five) minutes as needed for chest pain.      nystatin (MYCOSTATIN/NYSTOP) powder Apply 1 application topically 3 (three) times daily as needed (rash).     pantoprazole (PROTONIX) 40 MG tablet Take 40 mg by mouth daily.     pravastatin (PRAVACHOL) 40 MG tablet Take 40 mg by mouth at bedtime.     spironolactone (ALDACTONE) 25 MG tablet Take 12.5 mg by mouth daily.     tiZANidine (ZANAFLEX) 4 MG tablet Take 4 mg by mouth 3 (three) times daily as needed for muscle spasms.      HYDROcodone-acetaminophen (NORCO) 5-325 MG tablet Take 1-2 tablets by mouth every 6 (six) hours as needed for moderate pain or severe pain. (Patient not taking: Reported on 12/14/2021) 40 tablet 0   polyethylene glycol (MIRALAX / GLYCOLAX) 17 g packet Take 17 g by mouth daily as needed for mild constipation. (Patient not taking: Reported on 12/14/2021) 14 each 0   senna-docusate (SENOKOT-S) 8.6-50 MG tablet Take 1 tablet by mouth 2 (two) times daily. (Patient not taking: Reported on 12/14/2021)      REVIEW OF SYSTEMS:  [X]  denotes positive finding, [ ]  denotes negative finding Cardiac  Comments:  Chest pain or chest pressure:    Shortness of breath upon exertion:    Short of breath when lying flat:    Irregular heart rhythm:        Vascular    Pain in calf, thigh, or hip brought on by ambulation:    Pain in feet at night that wakes you up from your sleep:     Blood clot in your veins:    Leg swelling:         Pulmonary    Oxygen at home:    Productive cough:     Wheezing:         Neurologic    Sudden weakness in arms or legs:     Sudden numbness in arms or legs:     Sudden onset of difficulty speaking or slurred speech:    Temporary loss of vision in one eye:     Problems with dizziness:         Gastrointestinal    Blood in stool:     Vomited blood:         Genitourinary    Burning when urinating:     Blood in urine:         Psychiatric    Major depression:         Hematologic    Bleeding problems:    Problems with blood clotting too easily:        Skin    Rashes or ulcers:        Constitutional    Fever  or chills:      PHYSICAL EXAM Vitals:   12/14/21 1926 12/14/21 1930 12/14/21 1945 12/14/21 2000  BP:  (!) 128/113 (!) 146/132 122/62  Pulse:  83 71 (!) 32  Resp:  15 16 13   Temp:  98.2 F (36.8 C)    SpO2: 97% 99% 92% 97%  Weight:  86.2 kg    Height:  5\' 1"  (1.549 m)      Constitutional: Chronically ill appearing. No distress. Obese.  Neurologic: CN intact. no focal findings. no sensory loss. Psychiatric:  Mood and affect symmetric and appropriate. Eyes:  No icterus. No conjunctival pallor. Ears, nose, throat:  mucous membranes moist. Midline trachea.  Cardiac: regular rate and rhythm.  Respiratory:  unlabored. Abdominal:  soft, non-tender, non-distended.  Peripheral vascular: 2+ left femoral, popliteal, dorsalis pedis pulse  Trace right femoral pulse; absent popliteal / pedal pulses; No doppler flow in right foot. Extremity: Right foot markedly colder than left. no edema. no cyanosis. + pallor in right foot.  Skin: no gangrene. no ulceration.  Lymphatic: no Stemmer's sign. no palpable lymphadenopathy.  PERTINENT LABORATORY AND RADIOLOGIC DATA  Most recent CBC CBC Latest Ref Rng & Units 12/14/2021 12/17/2019 12/13/2019  WBC 4.0 - 10.5 K/uL - 10.5 10.7(H)  Hemoglobin 12.0 - 15.0 g/dL 10.9(L) 8.5(L) 8.3(L)  Hematocrit 36.0 - 46.0 % 32.0(L) 26.8(L) 25.4(L)  Platelets 150 - 400 K/uL - 436(H) 263     Most recent CMP CMP Latest Ref Rng & Units 12/14/2021 12/17/2019 12/13/2019  Glucose 70 - 99 mg/dL 149(H) 124(H) 106(H)  BUN 8 - 23 mg/dL 13 17 16   Creatinine 0.44 - 1.00 mg/dL 1.00 0.69 0.68  Sodium 135 - 145 mmol/L 140 135 135  Potassium 3.5 - 5.1 mmol/L 3.2(L) 4.7 4.1  Chloride 98 - 111 mmol/L 98 99 97(L)  CO2 22 - 32 mmol/L - 28 28  Calcium 8.9 - 10.3 mg/dL - 9.3 8.8(L)  Total Protein  6.5 - 8.1 g/dL - - -  Total Bilirubin 0.3 - 1.2 mg/dL - - -  Alkaline Phos 38 - 126 U/L - - -  AST 15 - 41 U/L - - -  ALT 0 - 44 U/L - - -    Renal function Estimated Creatinine Clearance: 51.5 mL/min (by C-G formula based on SCr of 1 mg/dL).  Hgb A1c MFr Bld (%)  Date Value  12/07/2019 7.5 (H)    No results found for: LDLCALC, LDLC, HIRISKLDL, POCLDL, LDLDIRECT, REALLDLC, TOTLDLC   Vascular Imaging: CT angiogram pending  Yevonne Aline. Stanford Breed, MD [facility] Phone Number: [phone] 12/14/2021 8:35 PM  Total time spent on preparing this encounter including chart review, data review, collecting history, examining the patient, coordinating care for this new patient, 60 minutes.  Portions of this report may have been transcribed using voice recognition software.  Every effort has been made to ensure accuracy; however, inadvertent computerized transcription errors may still be present.

## 2021-12-15 ENCOUNTER — Encounter (HOSPITAL_COMMUNITY): Payer: Self-pay | Admitting: Vascular Surgery

## 2021-12-15 ENCOUNTER — Other Ambulatory Visit (HOSPITAL_COMMUNITY): Payer: Self-pay

## 2021-12-15 ENCOUNTER — Inpatient Hospital Stay (HOSPITAL_COMMUNITY): Payer: Medicare Other

## 2021-12-15 DIAGNOSIS — I25810 Atherosclerosis of coronary artery bypass graft(s) without angina pectoris: Secondary | ICD-10-CM

## 2021-12-15 DIAGNOSIS — Z8616 Personal history of COVID-19: Secondary | ICD-10-CM | POA: Diagnosis not present

## 2021-12-15 DIAGNOSIS — I5032 Chronic diastolic (congestive) heart failure: Secondary | ICD-10-CM | POA: Diagnosis present

## 2021-12-15 DIAGNOSIS — Z6841 Body Mass Index (BMI) 40.0 and over, adult: Secondary | ICD-10-CM | POA: Diagnosis not present

## 2021-12-15 DIAGNOSIS — R001 Bradycardia, unspecified: Secondary | ICD-10-CM | POA: Diagnosis present

## 2021-12-15 DIAGNOSIS — I2510 Atherosclerotic heart disease of native coronary artery without angina pectoris: Secondary | ICD-10-CM | POA: Diagnosis present

## 2021-12-15 DIAGNOSIS — D649 Anemia, unspecified: Secondary | ICD-10-CM

## 2021-12-15 DIAGNOSIS — Z794 Long term (current) use of insulin: Secondary | ICD-10-CM | POA: Diagnosis not present

## 2021-12-15 DIAGNOSIS — Z951 Presence of aortocoronary bypass graft: Secondary | ICD-10-CM | POA: Diagnosis not present

## 2021-12-15 DIAGNOSIS — I10 Essential (primary) hypertension: Secondary | ICD-10-CM

## 2021-12-15 DIAGNOSIS — I429 Cardiomyopathy, unspecified: Secondary | ICD-10-CM | POA: Diagnosis not present

## 2021-12-15 DIAGNOSIS — E119 Type 2 diabetes mellitus without complications: Secondary | ICD-10-CM | POA: Diagnosis not present

## 2021-12-15 DIAGNOSIS — I959 Hypotension, unspecified: Secondary | ICD-10-CM | POA: Diagnosis not present

## 2021-12-15 DIAGNOSIS — I70209 Unspecified atherosclerosis of native arteries of extremities, unspecified extremity: Secondary | ICD-10-CM | POA: Diagnosis not present

## 2021-12-15 DIAGNOSIS — I70221 Atherosclerosis of native arteries of extremities with rest pain, right leg: Secondary | ICD-10-CM | POA: Diagnosis not present

## 2021-12-15 DIAGNOSIS — R0789 Other chest pain: Secondary | ICD-10-CM | POA: Diagnosis present

## 2021-12-15 DIAGNOSIS — J9611 Chronic respiratory failure with hypoxia: Secondary | ICD-10-CM

## 2021-12-15 DIAGNOSIS — R1084 Generalized abdominal pain: Secondary | ICD-10-CM | POA: Diagnosis not present

## 2021-12-15 DIAGNOSIS — E11622 Type 2 diabetes mellitus with other skin ulcer: Secondary | ICD-10-CM | POA: Diagnosis present

## 2021-12-15 DIAGNOSIS — D62 Acute posthemorrhagic anemia: Secondary | ICD-10-CM | POA: Diagnosis not present

## 2021-12-15 DIAGNOSIS — E0590 Thyrotoxicosis, unspecified without thyrotoxic crisis or storm: Secondary | ICD-10-CM | POA: Diagnosis not present

## 2021-12-15 DIAGNOSIS — I70229 Atherosclerosis of native arteries of extremities with rest pain, unspecified extremity: Secondary | ICD-10-CM | POA: Diagnosis not present

## 2021-12-15 DIAGNOSIS — I110 Hypertensive heart disease with heart failure: Secondary | ICD-10-CM | POA: Diagnosis not present

## 2021-12-15 DIAGNOSIS — I480 Paroxysmal atrial fibrillation: Secondary | ICD-10-CM

## 2021-12-15 DIAGNOSIS — K5900 Constipation, unspecified: Secondary | ICD-10-CM | POA: Diagnosis present

## 2021-12-15 DIAGNOSIS — L97329 Non-pressure chronic ulcer of left ankle with unspecified severity: Secondary | ICD-10-CM | POA: Diagnosis not present

## 2021-12-15 DIAGNOSIS — Y838 Other surgical procedures as the cause of abnormal reaction of the patient, or of later complication, without mention of misadventure at the time of the procedure: Secondary | ICD-10-CM | POA: Diagnosis not present

## 2021-12-15 DIAGNOSIS — Z79899 Other long term (current) drug therapy: Secondary | ICD-10-CM | POA: Diagnosis not present

## 2021-12-15 DIAGNOSIS — Z8701 Personal history of pneumonia (recurrent): Secondary | ICD-10-CM | POA: Diagnosis not present

## 2021-12-15 DIAGNOSIS — E1165 Type 2 diabetes mellitus with hyperglycemia: Secondary | ICD-10-CM | POA: Diagnosis present

## 2021-12-15 DIAGNOSIS — I743 Embolism and thrombosis of arteries of the lower extremities: Secondary | ICD-10-CM | POA: Diagnosis not present

## 2021-12-15 DIAGNOSIS — Z7901 Long term (current) use of anticoagulants: Secondary | ICD-10-CM | POA: Diagnosis not present

## 2021-12-15 DIAGNOSIS — L7632 Postprocedural hematoma of skin and subcutaneous tissue following other procedure: Secondary | ICD-10-CM | POA: Diagnosis not present

## 2021-12-15 LAB — CBC
HCT: 28.1 % — ABNORMAL LOW (ref 36.0–46.0)
HCT: 25.3 % — ABNORMAL LOW (ref 36.0–46.0)
HCT: 24.6 % — ABNORMAL LOW (ref 36.0–46.0)
Hemoglobin: 8.8 g/dL — ABNORMAL LOW (ref 12.0–15.0)
Hemoglobin: 8.1 g/dL — ABNORMAL LOW (ref 12.0–15.0)
Hemoglobin: 8.1 g/dL — ABNORMAL LOW (ref 12.0–15.0)
MCH: 28.3 pg (ref 26.0–34.0)
MCH: 28.7 pg (ref 26.0–34.0)
MCH: 29.3 pg (ref 26.0–34.0)
MCHC: 31.3 g/dL (ref 30.0–36.0)
MCHC: 32 g/dL (ref 30.0–36.0)
MCHC: 32.9 g/dL (ref 30.0–36.0)
MCV: 90.4 fL (ref 80.0–100.0)
MCV: 89.7 fL (ref 80.0–100.0)
MCV: 89.1 fL (ref 80.0–100.0)
Platelets: 263 10*3/uL (ref 150–400)
Platelets: 249 10*3/uL (ref 150–400)
Platelets: 239 10*3/uL (ref 150–400)
RBC: 3.11 MIL/uL — ABNORMAL LOW (ref 3.87–5.11)
RBC: 2.82 MIL/uL — ABNORMAL LOW (ref 3.87–5.11)
RBC: 2.76 MIL/uL — ABNORMAL LOW (ref 3.87–5.11)
RDW: 14.6 % (ref 11.5–15.5)
RDW: 14.4 % (ref 11.5–15.5)
RDW: 14.6 % (ref 11.5–15.5)
WBC: 10.6 10*3/uL — ABNORMAL HIGH (ref 4.0–10.5)
WBC: 11.2 10*3/uL — ABNORMAL HIGH (ref 4.0–10.5)
WBC: 11.5 10*3/uL — ABNORMAL HIGH (ref 4.0–10.5)
nRBC: 0 % (ref 0.0–0.2)
nRBC: 0 % (ref 0.0–0.2)
nRBC: 0 % (ref 0.0–0.2)

## 2021-12-15 LAB — LIPID PANEL
Cholesterol: 109 mg/dL (ref 0–200)
HDL: 27 mg/dL — ABNORMAL LOW (ref >40)
LDL Cholesterol: 70 mg/dL (ref 0–99)
Total CHOL/HDL Ratio: 4 RATIO
Triglycerides: 58 mg/dL (ref <150)
VLDL: 12 mg/dL (ref 0–40)

## 2021-12-15 LAB — GLUCOSE, CAPILLARY
Glucose-Capillary: 113 mg/dL — ABNORMAL HIGH (ref 70–99)
Glucose-Capillary: 179 mg/dL — ABNORMAL HIGH (ref 70–99)
Glucose-Capillary: 232 mg/dL — ABNORMAL HIGH (ref 70–99)
Glucose-Capillary: 269 mg/dL — ABNORMAL HIGH (ref 70–99)

## 2021-12-15 LAB — BASIC METABOLIC PANEL
Anion gap: 9 (ref 5–15)
BUN: 12 mg/dL (ref 8–23)
CO2: 26 mmol/L (ref 22–32)
Calcium: 7.8 mg/dL — ABNORMAL LOW (ref 8.9–10.3)
Chloride: 100 mmol/L (ref 98–111)
Creatinine, Ser: 0.87 mg/dL (ref 0.44–1.00)
GFR, Estimated: >60 mL/min (ref >60)
Glucose, Bld: 192 mg/dL — ABNORMAL HIGH (ref 70–99)
Potassium: 3.9 mmol/L (ref 3.5–5.1)
Sodium: 135 mmol/L (ref 135–145)

## 2021-12-15 LAB — ECHOCARDIOGRAM COMPLETE
Height: 61 in
Weight: 2994.73 oz

## 2021-12-15 LAB — PREPARE RBC (CROSSMATCH)

## 2021-12-15 LAB — TROPONIN I (HIGH SENSITIVITY): Troponin I (High Sensitivity): 15 ng/L (ref <18)

## 2021-12-15 LAB — HEPARIN LEVEL (UNFRACTIONATED): Heparin Unfractionated: <0.1 IU/mL — ABNORMAL LOW (ref 0.30–0.70)

## 2021-12-15 MED ORDER — EZETIMIBE 10 MG PO TABS
10.0000 mg | ORAL_TABLET | Freq: Every day | ORAL | Status: DC
Start: 2021-12-15 — End: 2021-12-21
  Administered 2021-12-15 – 2021-12-21 (×7): 10 mg via ORAL
  Filled 2021-12-15 (×7): qty 1

## 2021-12-15 MED ORDER — DROPERIDOL 2.5 MG/ML IJ SOLN: 0.6250 mg | Freq: Once | INTRAMUSCULAR | Status: DC | PRN

## 2021-12-15 MED ORDER — TIZANIDINE HCL 4 MG PO TABS
4.0000 mg | ORAL_TABLET | Freq: Three times a day (TID) | ORAL | Status: DC | PRN
  Administered 2021-12-15 – 2021-12-16 (×2): 4 mg via ORAL
  Filled 2021-12-15 (×3): qty 1

## 2021-12-15 MED ORDER — FUROSEMIDE 20 MG PO TABS
20.0000 mg | ORAL_TABLET | Freq: Every day | ORAL | Status: DC
  Administered 2021-12-15: 20 mg via ORAL
  Filled 2021-12-15: qty 1

## 2021-12-15 MED ORDER — HYDROCODONE-ACETAMINOPHEN 5-325 MG PO TABS: 1.0000 | ORAL_TABLET | Freq: Four times a day (QID) | ORAL | Status: DC | PRN

## 2021-12-15 MED ORDER — SPIRONOLACTONE 12.5 MG HALF TABLET
12.5000 mg | ORAL_TABLET | Freq: Every day | ORAL | Status: DC
  Administered 2021-12-15: 12.5 mg via ORAL
  Filled 2021-12-15: qty 1

## 2021-12-15 MED ORDER — METOPROLOL SUCCINATE ER 50 MG PO TB24
200.0000 mg | ORAL_TABLET | Freq: Every day | ORAL | Status: DC
  Filled 2021-12-15: qty 2

## 2021-12-15 MED ORDER — DOCUSATE SODIUM 100 MG PO CAPS
100.0000 mg | ORAL_CAPSULE | Freq: Every day | ORAL | Status: DC
  Administered 2021-12-15 – 2021-12-21 (×7): 100 mg via ORAL
  Filled 2021-12-15 (×7): qty 1

## 2021-12-15 MED ORDER — HYDROMORPHONE HCL 1 MG/ML IJ SOLN
INTRAMUSCULAR | Status: AC
  Filled 2021-12-15: qty 1

## 2021-12-15 MED ORDER — SODIUM CHLORIDE 0.9% IV SOLUTION: Freq: Once | INTRAVENOUS | Status: DC

## 2021-12-15 MED ORDER — PHENOL 1.4 % MT LIQD: 1.0000 | OROMUCOSAL | Status: DC | PRN

## 2021-12-15 MED ORDER — MAGNESIUM SULFATE 2 GM/50ML IV SOLN
2.0000 g | Freq: Every day | INTRAVENOUS | Status: AC | PRN
  Administered 2021-12-17: 2 g via INTRAVENOUS
  Filled 2021-12-15: qty 50

## 2021-12-15 MED ORDER — METOPROLOL TARTRATE 5 MG/5ML IV SOLN: 2.0000 mg | INTRAVENOUS | Status: DC | PRN

## 2021-12-15 MED ORDER — SODIUM CHLORIDE 0.9 % IV SOLN
500.0000 mL | Freq: Once | INTRAVENOUS | Status: AC | PRN
  Administered 2021-12-15: 500 mL via INTRAVENOUS

## 2021-12-15 MED ORDER — MIRABEGRON ER 50 MG PO TB24
50.0000 mg | ORAL_TABLET | Freq: Every day | ORAL | Status: DC
  Administered 2021-12-15 – 2021-12-21 (×7): 50 mg via ORAL
  Filled 2021-12-15 (×7): qty 1

## 2021-12-15 MED ORDER — ACETAMINOPHEN 650 MG RE SUPP: 325.0000 mg | RECTAL | Status: DC | PRN

## 2021-12-15 MED ORDER — PRAVASTATIN SODIUM 40 MG PO TABS: 40.0000 mg | ORAL_TABLET | Freq: Every day | ORAL | Status: DC

## 2021-12-15 MED ORDER — ALBUTEROL SULFATE (2.5 MG/3ML) 0.083% IN NEBU: 3.0000 mL | INHALATION_SOLUTION | Freq: Four times a day (QID) | RESPIRATORY_TRACT | Status: DC | PRN

## 2021-12-15 MED ORDER — ONDANSETRON HCL 4 MG/2ML IJ SOLN
4.0000 mg | Freq: Four times a day (QID) | INTRAMUSCULAR | Status: DC | PRN
  Administered 2021-12-16 – 2021-12-20 (×3): 4 mg via INTRAVENOUS
  Filled 2021-12-15 (×3): qty 2

## 2021-12-15 MED ORDER — PANTOPRAZOLE SODIUM 40 MG PO TBEC
40.0000 mg | DELAYED_RELEASE_TABLET | Freq: Every day | ORAL | Status: DC
  Filled 2021-12-15: qty 1

## 2021-12-15 MED ORDER — ACETAMINOPHEN 325 MG PO TABS
325.0000 mg | ORAL_TABLET | ORAL | Status: DC | PRN
  Administered 2021-12-15: 650 mg via ORAL
  Administered 2021-12-15: 325 mg via ORAL
  Filled 2021-12-15 (×2): qty 2
  Filled 2021-12-15: qty 1
  Filled 2021-12-15: qty 2

## 2021-12-15 MED ORDER — INSULIN GLARGINE-YFGN 100 UNIT/ML ~~LOC~~ SOLN
35.0000 [IU] | Freq: Every day | SUBCUTANEOUS | Status: DC
  Administered 2021-12-15: 35 [IU] via SUBCUTANEOUS
  Filled 2021-12-15 (×3): qty 0.35

## 2021-12-15 MED ORDER — BISACODYL 10 MG RE SUPP: 10.0000 mg | Freq: Every day | RECTAL | Status: DC | PRN

## 2021-12-15 MED ORDER — HEPARIN (PORCINE) 25000 UT/250ML-% IV SOLN
1700.0000 [IU]/h | INTRAVENOUS | Status: DC
  Administered 2021-12-15: 16:00:00 1350 [IU]/h via INTRAVENOUS
  Administered 2021-12-16: 1450 [IU]/h via INTRAVENOUS
  Administered 2021-12-17: 1400 [IU]/h via INTRAVENOUS
  Administered 2021-12-18: 1600 [IU]/h via INTRAVENOUS
  Administered 2021-12-19 (×2): 1700 [IU]/h via INTRAVENOUS
  Filled 2021-12-15 (×8): qty 250

## 2021-12-15 MED ORDER — POTASSIUM CHLORIDE CRYS ER 20 MEQ PO TBCR
20.0000 meq | EXTENDED_RELEASE_TABLET | Freq: Every day | ORAL | Status: AC | PRN
  Administered 2021-12-16: 20 meq via ORAL
  Filled 2021-12-15: qty 1

## 2021-12-15 MED ORDER — OXYCODONE-ACETAMINOPHEN 5-325 MG PO TABS
1.0000 | ORAL_TABLET | ORAL | Status: DC | PRN
  Administered 2021-12-15: 2 via ORAL
  Administered 2021-12-15 – 2021-12-16 (×4): 1 via ORAL
  Administered 2021-12-17 – 2021-12-20 (×8): 2 via ORAL
  Filled 2021-12-15: qty 2
  Filled 2021-12-15: qty 1
  Filled 2021-12-15 (×2): qty 2
  Filled 2021-12-15: qty 1
  Filled 2021-12-15 (×3): qty 2
  Filled 2021-12-15: qty 1
  Filled 2021-12-15: qty 2
  Filled 2021-12-15: qty 1
  Filled 2021-12-15 (×3): qty 2

## 2021-12-15 MED ORDER — ALUM & MAG HYDROXIDE-SIMETH 200-200-20 MG/5ML PO SUSP: 15.0000 mL | ORAL | Status: DC | PRN

## 2021-12-15 MED ORDER — INSULIN ASPART 100 UNIT/ML IJ SOLN
0.0000 [IU] | Freq: Three times a day (TID) | INTRAMUSCULAR | Status: DC
  Administered 2021-12-15: 5 [IU] via SUBCUTANEOUS
  Administered 2021-12-15: 3 [IU] via SUBCUTANEOUS
  Administered 2021-12-16: 5 [IU] via SUBCUTANEOUS
  Administered 2021-12-16: 3 [IU] via SUBCUTANEOUS
  Administered 2021-12-16: 5 [IU] via SUBCUTANEOUS

## 2021-12-15 MED ORDER — FLUTICASONE PROPIONATE 50 MCG/ACT NA SUSP
1.0000 | Freq: Every day | NASAL | Status: DC | PRN
  Filled 2021-12-15: qty 16

## 2021-12-15 MED ORDER — SENNOSIDES-DOCUSATE SODIUM 8.6-50 MG PO TABS
1.0000 | ORAL_TABLET | Freq: Two times a day (BID) | ORAL | Status: DC
  Administered 2021-12-15 – 2021-12-21 (×13): 1 via ORAL
  Filled 2021-12-15 (×13): qty 1

## 2021-12-15 MED ORDER — CHLORHEXIDINE GLUCONATE CLOTH 2 % EX PADS
6.0000 | MEDICATED_PAD | Freq: Every day | CUTANEOUS | Status: DC
  Administered 2021-12-16 – 2021-12-17 (×2): 6 via TOPICAL

## 2021-12-15 MED ORDER — HYDRALAZINE HCL 20 MG/ML IJ SOLN: 5.0000 mg | INTRAMUSCULAR | Status: DC | PRN

## 2021-12-15 MED ORDER — HYDROMORPHONE HCL 1 MG/ML IJ SOLN
0.2500 mg | INTRAMUSCULAR | Status: DC | PRN
  Administered 2021-12-15: 0.5 mg via INTRAVENOUS

## 2021-12-15 MED ORDER — MORPHINE SULFATE (PF) 2 MG/ML IV SOLN
2.0000 mg | INTRAVENOUS | Status: DC | PRN
  Administered 2021-12-16 – 2021-12-21 (×6): 2 mg via INTRAVENOUS
  Filled 2021-12-15 (×6): qty 1

## 2021-12-15 MED ORDER — NITROGLYCERIN 0.4 MG SL SUBL
0.4000 mg | SUBLINGUAL_TABLET | SUBLINGUAL | Status: DC | PRN
  Filled 2021-12-15: qty 1

## 2021-12-15 MED ORDER — EMPAGLIFLOZIN 10 MG PO TABS
10.0000 mg | ORAL_TABLET | Freq: Every day | ORAL | Status: DC
Start: 2021-12-15 — End: 2021-12-21
  Administered 2021-12-15 – 2021-12-21 (×7): 10 mg via ORAL
  Filled 2021-12-15 (×7): qty 1

## 2021-12-15 MED ORDER — POLYETHYLENE GLYCOL 3350 17 G PO PACK: 17.0000 g | PACK | Freq: Every day | ORAL | Status: DC | PRN

## 2021-12-15 MED ORDER — CLOPIDOGREL BISULFATE 75 MG PO TABS
75.0000 mg | ORAL_TABLET | Freq: Every day | ORAL | Status: DC
Start: 2021-12-15 — End: 2021-12-21
  Administered 2021-12-15 – 2021-12-21 (×7): 75 mg via ORAL
  Filled 2021-12-15 (×7): qty 1

## 2021-12-15 MED ORDER — GUAIFENESIN-DM 100-10 MG/5ML PO SYRP: 15.0000 mL | ORAL_SOLUTION | ORAL | Status: DC | PRN

## 2021-12-15 MED ORDER — METHIMAZOLE 5 MG PO TABS
5.0000 mg | ORAL_TABLET | Freq: Every day | ORAL | Status: DC
  Administered 2021-12-15 – 2021-12-21 (×7): 5 mg via ORAL
  Filled 2021-12-15 (×7): qty 1

## 2021-12-15 MED ORDER — PANTOPRAZOLE SODIUM 40 MG PO TBEC
40.0000 mg | DELAYED_RELEASE_TABLET | Freq: Every day | ORAL | Status: DC
  Administered 2021-12-15 – 2021-12-21 (×7): 40 mg via ORAL
  Filled 2021-12-15 (×7): qty 1

## 2021-12-15 MED ORDER — SODIUM CHLORIDE 0.9 % IV SOLN: INTRAVENOUS | Status: DC

## 2021-12-15 MED ORDER — LABETALOL HCL 5 MG/ML IV SOLN
10.0000 mg | INTRAVENOUS | Status: DC | PRN
  Administered 2021-12-16 – 2021-12-17 (×3): 10 mg via INTRAVENOUS
  Filled 2021-12-15 (×3): qty 4

## 2021-12-15 MED ORDER — ROSUVASTATIN CALCIUM 20 MG PO TABS
20.0000 mg | ORAL_TABLET | Freq: Every day | ORAL | Status: DC
  Administered 2021-12-15 – 2021-12-20 (×6): 20 mg via ORAL
  Filled 2021-12-15 (×6): qty 1

## 2021-12-15 MED ORDER — PERFLUTREN LIPID MICROSPHERE
1.0000 mL | INTRAVENOUS | Status: AC | PRN
  Administered 2021-12-15: 2.5 mL via INTRAVENOUS
  Filled 2021-12-15: qty 10

## 2021-12-15 NOTE — Progress Notes (Addendum)
Patient arrived from PACU via Bed with Micheal RN . Pt AO x4. Denies pain at the time. Left leg pedal pulses palpable, verified with doppler. CHG bath completed, connected to tele and CCMD notified. Vitals stable .Oriented patient to room and call bell system. Call bell within reach. Will continue to monitor.

## 2021-12-15 NOTE — H&P (Addendum)
Hospitalist Consultation History and Physical    Tangella Waldrop [id number] DOB: [birth year] DOA: 12/14/2021  DOS: the patient was seen and examined on 12/14/2021  PCP: Theodosia Blender, PA-C   Patient coming from: Home  I have personally briefly reviewed patient's old medical records in [facility]  Reason for internal medicine consultation: Diabetes, hypertension, history of A. Fib Requesting physician: Luan Pulling MD Chief complaint: Critical right leg ischemia History present illness: 71 year old female lives at home with her daughter presented to the ER today with right leg ischemia.  Patient states that sometime this afternoon, she could not feel her right leg.  Her right foot was getting increasingly cool to touch.  She waited an hour before calling EMS.  She arrived to the ER.  No pulses were felt in the right limb.  No pedal pulses could be dopplered.  Vascular surgery saw the patient.  They felt the patient needed emergent surgery.  Patient taken the OR tonight for a fasciotomy along with arterial thrombectomy.  Postoperatively, patient was hemodynamically stable.  Consultation for medical management.  Patient states that she was taken off Eliquis about a year ago according to her memory.  I reviewed her chart, there is no mention any of her hospital discharge summaries about stopping Eliquis.  Patient had a rectus sheath hematoma bleed while she was taking Plavix in the past.  [facility] hospital[facility] consulted for medical management.     ED Course: no right pedal pulses felt. Vascular surgery taking patient to OR for thrombectomy  Review of Systems:  Review of Systems  Constitutional: Negative.   HENT: Negative.    Eyes: Negative.   Respiratory: Negative.    Cardiovascular: Negative.   Gastrointestinal: Negative.   Genitourinary: Negative.   Musculoskeletal:        No sensation or pulse in right foot  Skin:        Right foot cool to touch  Neurological: Negative.    Endo/Heme/Allergies: Negative.   Psychiatric/Behavioral: Negative.    All other systems reviewed and are negative.  Past Medical History:  Diagnosis Date   Closed intertrochanteric fracture, left, initial encounter ([place]) 12/08/2019   Diabetes mellitus without complication ([place])    Hypertension     Past Surgical History:  Procedure Laterality Date   CARDIAC CATHETERIZATION     CHOLECYSTECTOMY     FEMUR IM NAIL Left 12/09/2019   Procedure: INTRAMEDULLARY (IM) NAIL FEMORAL;  Surgeon: Paralee Cancel, MD;  Location: [facility];  Service: Orthopedics;  Laterality: Left;     reports that she has never smoked. She has never used smokeless tobacco. She reports current alcohol use of about 1.0 standard drink per week. She reports that she does not use drugs.  Allergies  Allergen Reactions   Penicillins Hives and Shortness Of Breath   Azithromycin Hives   Drug Ingredient [Cephalexin] Hives    History reviewed. No pertinent family history.  Prior to Admission medications   Medication Sig Start Date End Date Taking? Authorizing Provider  albuterol (VENTOLIN HFA) 108 (90 Base) MCG/ACT inhaler Inhale 2 puffs into the lungs every 6 (six) hours as needed for wheezing or shortness of breath. 12/06/21  Yes [provider]  clopidogrel (PLAVIX) 75 MG tablet Take 75 mg by mouth daily.   Yes [provider]  empagliflozin (JARDIANCE) 10 MG TABS tablet Take 10 mg by mouth daily.   Yes [provider]  ezetimibe (ZETIA) 10 MG tablet Take 10 mg by mouth daily. 11/16/19  Yes [provider]  fluticasone (FLONASE) 50 MCG/ACT nasal spray Place 1 spray into both nostrils daily as needed for allergies. 06/25/20  Yes [provider]  furosemide (LASIX) 40 MG tablet Take 20 mg by mouth daily.  10/17/19  Yes [provider]  LANTUS SOLOSTAR 100 UNIT/ML Solostar Pen Inject 35 Units into the skin at bedtime. 11/21/19  Yes [provider]  metFORMIN (GLUCOPHAGE)  500 MG tablet Take 500 mg by mouth 3 (three) times daily. 11/16/19  Yes [provider]  methimazole (TAPAZOLE) 5 MG tablet Take 5 mg by mouth daily. 10/15/21  Yes [provider]  metoprolol (TOPROL-XL) 200 MG 24 hr tablet Take 200 mg by mouth daily.   Yes [provider]  MYRBETRIQ 50 MG TB24 tablet Take 50 mg by mouth daily. 10/09/19  Yes [provider]  nitroGLYCERIN (NITROSTAT) 0.4 MG SL tablet Place 0.4 mg under the tongue every 5 (five) minutes as needed for chest pain.    Yes [provider]  nystatin (MYCOSTATIN/NYSTOP) powder Apply 1 application topically 3 (three) times daily as needed (rash).   Yes [provider]  pantoprazole (PROTONIX) 40 MG tablet Take 40 mg by mouth daily. 10/17/19  Yes [provider]  pravastatin (PRAVACHOL) 40 MG tablet Take 40 mg by mouth at bedtime. 11/19/19  Yes [provider]  spironolactone (ALDACTONE) 25 MG tablet Take 12.5 mg by mouth daily. 10/05/21  Yes [provider]  tiZANidine (ZANAFLEX) 4 MG tablet Take 4 mg by mouth 3 (three) times daily as needed for muscle spasms.  11/13/19  Yes [provider]  HYDROcodone-acetaminophen (NORCO) 5-325 MG tablet Take 1-2 tablets by mouth every 6 (six) hours as needed for moderate pain or severe pain. Patient not taking: Reported on 12/14/2021 12/10/19   Danae Orleans, PA-C  polyethylene glycol (MIRALAX / GLYCOLAX) 17 g packet Take 17 g by mouth daily as needed for mild constipation. Patient not taking: Reported on 12/14/2021 12/17/19   Domenic Polite, MD  senna-docusate (SENOKOT-S) 8.6-50 MG tablet Take 1 tablet by mouth 2 (two) times daily. Patient not taking: Reported on 12/14/2021 12/17/19   Domenic Polite, MD    Physical Exam: Vitals:   12/14/21 2129 12/15/21 0015 12/15/21 0030 12/15/21 0045  BP: (!) 104/50 (!) 94/56 106/88 108/67  Pulse: 64 (!) 54 (!) 52 (!) 57  Resp: 12 (!) 2 13 12   Temp:  (!) 97.3 F (36.3 C)    SpO2:  100% 98% 92% 95%  Weight:      Height:        Physical Exam Vitals and nursing note reviewed.  Constitutional:      General: She is not in acute distress.    Appearance: Normal appearance. She is obese. She is not ill-appearing, toxic-appearing or diaphoretic.  HENT:     Head: Normocephalic and atraumatic.     Nose: Nose normal. No rhinorrhea.  Cardiovascular:     Rate and Rhythm: Regular rhythm. Bradycardia present.     Heart sounds: Murmur heard.     Comments: Bilateral DP pulses palpable Pulmonary:     Effort: Pulmonary effort is normal. No respiratory distress.     Breath sounds: No wheezing or rales.  Abdominal:     General: Bowel sounds are normal. There is no distension.     Tenderness: There is no abdominal tenderness. There is no guarding.  Musculoskeletal:     Right lower leg: No edema.     Left lower leg: No edema.  Skin:    General: Skin is warm and dry.     Capillary Refill: Capillary refill takes less than 2 seconds.  Neurological:     General: No focal deficit present.     Mental Status: She is alert and oriented to person, place, and time.     Labs on Admission: I have personally reviewed following labs and imaging studies  CBC: Recent Labs  Lab 12/14/21 1920 12/14/21 2004  WBC 9.7  --   NEUTROABS 6.6  --   HGB 11.6* 10.9*  HCT 36.9 32.0*  MCV 91.1  --   PLT 310  --    Basic Metabolic Panel: Recent Labs  Lab 12/14/21 1920 12/14/21 2004  NA 136 140  K 3.2* 3.2*  CL 100 98  CO2 26  --   GLUCOSE 126* 149*  BUN 11 13  CREATININE 1.08* 1.00  CALCIUM 8.7*  --    GFR: Estimated Creatinine Clearance: 51.5 mL/min (by C-G formula based on SCr of 1 mg/dL). Liver Function Tests: No results for input(s): AST, ALT, ALKPHOS, BILITOT, PROT, ALBUMIN in the last 168 hours. No results for input(s): LIPASE, AMYLASE in the last 168 hours. No results for input(s): AMMONIA in the last 168 hours. Coagulation Profile: No results for input(s): INR, PROTIME  in the last 168 hours. Cardiac Enzymes: No results for input(s): CKTOTAL, CKMB, CKMBINDEX, TROPONINI in the last 168 hours. BNP (last 3 results) No results for input(s): PROBNP in the last 8760 hours. HbA1C: No results for input(s): HGBA1C in the last 72 hours. CBG: Recent Labs  Lab 12/15/21 0019  GLUCAP 113*   Lipid Profile: No results for input(s): CHOL, HDL, LDLCALC, TRIG, CHOLHDL, LDLDIRECT in the last 72 hours. Thyroid Function Tests: No results for input(s): TSH, T4TOTAL, FREET4, T3FREE, THYROIDAB in the last 72 hours. Anemia Panel: No results for input(s): VITAMINB12, FOLATE, FERRITIN, TIBC, IRON, RETICCTPCT in the last 72 hours. Urine analysis: No results found for: COLORURINE, APPEARANCEUR, LABSPEC, [place], GLUCOSEU, HGBUR, BILIRUBINUR, KETONESUR, PROTEINUR, UROBILINOGEN, NITRITE, LEUKOCYTESUR  Radiological Exams on Admission: I have personally reviewed images CT ANGIO AO+BIFEM W & OR WO CONTRAST  Result Date: 12/14/2021 CLINICAL DATA:  Right lower extremity weakness and pain, rule out arterial embolism EXAM: CT ANGIOGRAPHY OF ABDOMINAL AORTA WITH ILIOFEMORAL RUNOFF TECHNIQUE: Multidetector CT imaging of the abdomen, pelvis and lower extremities was performed using the standard protocol during bolus administration of intravenous contrast. Multiplanar CT image reconstructions and MIPs were obtained to evaluate the vascular anatomy. RADIATION DOSE REDUCTION: This exam was performed according to the departmental dose-optimization program which includes automated exposure control, adjustment of the mA and/or kV according to patient size and/or use of iterative reconstruction technique. CONTRAST:  148mL OMNIPAQUE IOHEXOL 350 MG/ML SOLN COMPARISON:  CT 11/22/2020 FINDINGS: VASCULAR Aorta: Minimal calcified atheromatous plaque in the infrarenal segment. No aneurysm, dissection, or stenosis. Celiac: Patent without evidence of aneurysm, dissection, vasculitis or significant stenosis. SMA:  Patent without evidence of aneurysm, dissection, vasculitis or significant stenosis. Replaced right hepatic arterial supply, an anatomic variant. Renals: Duplicated left, superior dominant, both patent. Single right, widely patent. IMA: Patent without evidence of aneurysm, dissection, vasculitis or significant stenosis. RIGHT Lower Extremity Inflow: Minimal calcified plaque. No aneurysm, dissection, or stenosis. Outflow: Common femoral distal embolic occlusion, new since previous, extending across the bifurcation into proximal deep femoral and superficial femoral branches Deep femoral reconstitution less than 1 cm beyond its origin; muscular branches opacified to the distal thigh level. SFA reconstitution less than 1 cm  beyond its origin. There is gradual decrease in the degree of contrast enhancement of the lumen extending approximately 11 cm from the origin, non-opacified distally Popliteal artery is also thrombosed proximally, with reconstitution by collaterals above the knee, only a small opacified channel noted distally to the expected level of its bifurcation. Runoff: Non-opacified LEFT Lower Extremity Inflow: Common, internal and external iliac arteries are patent without evidence of aneurysm, dissection, vasculitis or significant stenosis. Outflow: Common, superficial and profunda femoral arteries are patent without evidence of aneurysm, dissection, vasculitis or significant stenosis. Popliteal artery is patent with some eccentric nonocclusive plaque just above the. Runoff: Patent three-vessel runoff is opacified to the distal calf level. Veins: Early opacification of the central right femoral vein at approximately the level of the right common femoral artery bifurcation, consistent with clinical history of AV fistula. Some contrast reflux into hepatic veins from the dilated right atrium suggesting a degree of right heart dysfunction. Review of the MIP images confirms the above findings. NON-VASCULAR Lower  chest: No pleural or pericardial  effusion. Hepatobiliary: No focal liver abnormality is seen. Status post cholecystectomy. No biliary dilatation. Pancreas: Unremarkable. No pancreatic ductal dilatation or surrounding inflammatory changes. Spleen: Normal in size without focal abnormality. Adrenals/Urinary Tract: Adrenal glands are unremarkable. Kidneys are normal, without renal calculi, focal lesion, or hydronephrosis. Bladder is unremarkable. Stomach/Bowel: The stomach is incompletely distended, unremarkable. Small bowel decompressed. Appendix not identified. The colon is nondilated proximally. There is mild fecal distention of the rectum. No wall thickening or adjacent inflammatory change. Lymphatic: No abdominal or pelvic adenopathy. Reproductive: Status post hysterectomy. No adnexal masses. Other: No ascites.  No free air. Musculoskeletal: L3 compression fracture deformity with less than 20% loss of height, new since previous. Stable mild T12 compression deformity. Left femur IM rod with sliding screw across the femoral neck, and distal interlocking screw as before. IM rod in the right tibia with distal interlocking screw as before. Orthopedic hardware in the left medial and lateral malleoli. IMPRESSION: 1. Probable acute, near-occlusive embolus in the distal RIGHT common femoral artery extending a short distance into the deep femoral and SFA branches. 2. Long segment occlusion of the RIGHT SFA from the mid thigh through the proximal popliteal artery. 3. Reconstitution of a small patent channel in the RIGHT proximal popliteal artery to its bifurcation, with no opacification of tibial runoff. 4. 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 Critical Value/emergent results were called by telephone at the time of interpretation on 12/14/2021 at 9:51 pm to provider Davonna Belling MD, who verbally acknowledged these results. 5. No significant  LEFT lower extremity arterial occlusive disease to the distal calf, incomplete vascular opacification distally possibly due to scan timing. 6. Subacute L3 compression fracture Electronically Signed   By: Lucrezia Europe M.D.   On: 12/14/2021 21:52    EKG: I have personally reviewed EKG: NSR   Assessment/Plan Principal Problem:   Critical lower limb ischemia (HCC) Active Problems:   PAF (paroxysmal atrial fibrillation) (HCC)   Hypertension   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   Hypothyroidism   Morbid obesity with BMI of 40.0-44.9, adult (HCC)   Type 2 diabetes mellitus, with long-term current use of insulin (HCC)    Assessment and Plan: * Critical lower limb ischemia ([place])- (present on admission) Admitted to vascular surgery service. S/p right lower leg arterial thrombectomy. On IV heparin gtts. Management per vascular surgery  service.  PAF (paroxysmal atrial fibrillation) ([place])- (present on admission) Pt with long hx of afib/atrial flutter. Had been on Eliquis in the past.  Patient has been admitted to the hospital several times in the last 2 to 3 years.  Most recently in March 2021 to [facility].  She was discharged to home on Eliquis 5 mg twice daily.  This is clearly in the discharge summary.  However patient states that she was told not to take Eliquis.  Patient has been off Eliquis for approximately 9 months now.  She did have a rectus sheath hematoma while she was taking Plavix in the past. Check echo.  Type 2 diabetes mellitus, with long-term current use of insulin (HCC) Continue Lantus.  Add sliding scale insulin.  Morbid obesity with BMI of 40.0-44.9, adult (HCC) Chronic  Hypothyroidism- (present on admission) Continue Synthroid  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- (present on admission) On Plavix, Toprol-XL, Zetia,  Pravachol.  Hypertension- (present on admission) Continue home meds.    DVT prophylaxis: IV heparin gtts Code Status: Full Code Family Communication: no family at bedside  Disposition Plan: return home  Admission status: Inpatient, Med-Surg. Admitted to vascular surgery service   Kristopher Oppenheim, DO [facility] 12/15/2021, 1:03 AM

## 2021-12-15 NOTE — Evaluation (Addendum)
Physical Therapy Evaluation Patient Details Name: Kelly Lee MRN: [id number] DOB: 11-Aug-1951 Today's Date: 12/15/2021  History of Present Illness  71 y.o. female presenting to ED 2/1 with RLE acute critical limb ischemia s/p emergent RLE arterial thrombectomy and R calf four compartment fasciotomy 12/15/21. Admission complicated by post-op hypotension with SPB in 90's. PMHx significant for DMII, HTN, paroxysmal A-fib, diastolic HF and fall with L hip fx 11/2019.   Clinical Impression  Pt presents with condition above and deficits mentioned below, see PT Problem List. PTA, she was mod I with stand step/pivot transfers to her w/c and primarily relied on her w/c for mobility. She would intermittently ambulate short distances in her home with a RW. Pt lives with family in a 1-level house with a ramped entrance. Currently, pt displays deficits in mobility secondary to anxiety, pain in her R leg, slight numbness in her R leg (improved since arrival to ED), and decreased strength AROM in her R lower leg. Pt self-limiting to bed mobility as she continued to keep her R foot elevated off the floor and scoot posteriorly rather than anteriorly, preventing weight bearing to stand. Provided increased assistance for bed mobility due to trying to limit weight through arms due to A-line, but pt capable of performing bed mobility and EOB scooting with min guard assist. If pt progresses to a level where she can perform transfers to/from her w/c safely without physical assistance she may be able to d/c home with HHPT. However, at this time recommending short-term rehab at a SNF to improve her independence and safety with all functional mobility. Will continue to follow acutely.  BP: 80/48 supine HOB elevated 94/57 sitting EOB 85/47 supine end of session  *reported some lightheadedness sitting EOB     Recommendations for follow up therapy are one component of a multi-disciplinary discharge planning process, led by the  attending physician.  Recommendations may be updated based on patient status, additional functional criteria and insurance authorization.  Follow Up Recommendations Skilled nursing-short term rehab (<3 hours/day) (pending progression)    Assistance Recommended at Discharge Intermittent Supervision/Assistance  Patient can return home with the following  A lot of help with walking and/or transfers;Two people to help with walking and/or transfers;A lot of help with bathing/dressing/bathroom;Two people to help with bathing/dressing/bathroom;Assistance with cooking/housework;Assist for transportation;Help with stairs or ramp for entrance    Equipment Recommendations None recommended by PT  Recommendations for Other Services       Functional Status Assessment Patient has had a recent decline in their functional status and demonstrates the ability to make significant improvements in function in a reasonable and predictable amount of time.     Precautions / Restrictions Precautions Precautions: Fall Precaution Comments: Hx of falls; monitor BP; wound vac; A-line Restrictions Weight Bearing Restrictions: No RLE Weight Bearing: Weight bearing as tolerated      Mobility  Bed Mobility Overal bed mobility: Needs Assistance Bed Mobility: Supine to Sit, Sit to Supine     Supine to sit: Min assist, HOB elevated Sit to supine: Min guard   General bed mobility comments: MinA using bed pad to scoot hips to EOB to reduce use of UEs due to A-line. Pt was able to transition legs off EOB and elevate trunk from elevated HOB with min guard. Min guard and extra time to elevate legs back onto bed to supine.    Transfers Overall transfer level: Needs assistance Equipment used: None  General transfer comment: Patient completed lateral scoots at EOB with modA using bed pad to reduce use of UEs due to A-line, more scooting posteriorly than laterally, avoiding placing R foot on ground to  try to elevate buttocks from bed for scooting.    Ambulation/Gait               General Gait Details: Deferred due to pain limiting weight bearing and attempts to stand.  Stairs            Wheelchair Mobility    Modified Rankin (Stroke Patients Only)       Balance Overall balance assessment: Needs assistance Sitting-balance support: No upper extremity supported (LLE supported on ground; maintains RLE off the ground in anticipation of pain.) Sitting balance-Leahy Scale: Good         Standing balance comment: Declined standing in anticipation of pain                             Pertinent Vitals/Pain Pain Assessment Pain Assessment: Faces Faces Pain Scale: Hurts even more Pain Location: R groin and RLE (surgical) Pain Descriptors / Indicators: Throbbing, Tender, Operative site guarding Pain Intervention(s): Monitored during session, Limited activity within patient's tolerance, Repositioned    Home Living Family/patient expects to be discharged to:: Private residence Living Arrangements: Children Available Help at Discharge: Family;Available PRN/intermittently Type of Home: House Home Access: Ramped entrance       Home Layout: One level Home Equipment: Wheelchair - manual;BSC/3in1;Tub Physiological scientist (2 wheels);Hospital bed Additional Comments: Reports hospital bed is broken    Prior Function Prior Level of Function : Needs assist       Physical Assist : ADLs (physical);Mobility (physical) Mobility (physical): Stairs ADLs (physical): IADLs Mobility Comments: Reports completing stand-pivot transfers to wc at home, wc for household and community mobility. RW short-distances to commode in bathroom as wc does not fit. RW short-distances in halls of house also, reports does not need assistance or guarding with mobility, except on ramps. ADLs Comments: Reports Mod I from wc level; family assists with IADLs     Hand Dominance   Dominant  Hand: Right    Extremity/Trunk Assessment   Upper Extremity Assessment Upper Extremity Assessment: Defer to OT evaluation    Lower Extremity Assessment Lower Extremity Assessment: RLE deficits/detail RLE Deficits / Details: Decreased R knee flexion AROM to about 75-80 degrees flexion due to pain and edema; able to lift knee into extension against gravity but unable to MMT formally due to pain; reports slight numbness in lower leg and foot, but improved since arrival to ED; elevates foot off ground throughout session, declining to put weight through it due to pain RLE Sensation: decreased light touch    Cervical / Trunk Assessment Cervical / Trunk Assessment: Other exceptions Cervical / Trunk Exceptions: Large body habitus  Communication   Communication: No difficulties  Cognition Arousal/Alertness: Awake/alert Behavior During Therapy: Anxious Overall Cognitive Status: Within Functional Limits for tasks assessed                                 General Comments: Pt self-limiting in placing weight on R foot and attempting to stand due to anticipation of pain.        General Comments General comments (skin integrity, edema, etc.): BP 80/48 supine HOB elevated, 94/57 sitting EOB, 85/47 supine end of session, reported some lightheadedness sitting EOB;  SpO2 decreased to high 80s-low 90s% on RA sitting EOB, re-donned [place] at 1.5 L O2    Exercises General Exercises - Lower Extremity Long Arc Quad: AROM, Right, 5 reps, Seated Heel Slides: AROM, Right, 5 reps, Seated   Assessment/Plan    PT Assessment Patient needs continued PT services  PT Problem List Decreased strength;Decreased range of motion;Decreased activity tolerance;Decreased balance;Decreased mobility;Cardiopulmonary status limiting activity;Impaired sensation;Pain       PT Treatment Interventions DME instruction;Gait training;Functional mobility training;Therapeutic activities;Therapeutic exercise;Balance  training;Neuromuscular re-education;Patient/family education;Wheelchair mobility training    PT Goals (Current goals can be found in the Care Plan section)  Acute Rehab PT Goals Patient Stated Goal: to get better PT Goal Formulation: With patient Time For Goal Achievement: 12/29/21 Potential to Achieve Goals: Good    Frequency Min 3X/week     Co-evaluation               AM-PAC PT "6 Clicks" Mobility  Outcome Measure Help needed turning from your back to your side while in a flat bed without using bedrails?: A Little Help needed moving from lying on your back to sitting on the side of a flat bed without using bedrails?: A Little Help needed moving to and from a bed to a chair (including a wheelchair)?: Total Help needed standing up from a chair using your arms (e.g., wheelchair or bedside chair)?: Total Help needed to walk in hospital room?: Total Help needed climbing 3-5 steps with a railing? : Total 6 Click Score: 10    End of Session Equipment Utilized During Treatment: Oxygen Activity Tolerance: Patient limited by pain;Other (comment) (limited by hypotension) Patient left: in bed;with call bell/phone within reach;with bed alarm set Nurse Communication: Mobility status;Other (comment) (BP) PT Visit Diagnosis: History of falling (Z91.81);Muscle weakness (generalized) (M62.81);Difficulty in walking, not elsewhere classified (R26.2);Pain Pain - Right/Left: Right Pain - part of body: Leg    Time: [id number] PT Time Calculation (min) (ACUTE ONLY): 34 min   Charges:   PT Evaluation $PT Eval Moderate Complexity: 1 Mod PT Treatments $Therapeutic Activity: 8-22 mins        Moishe Spice, PT, DPT Acute Rehabilitation Services  Pager: [phone] Office: [place] 12/15/2021, 9:30 AM

## 2021-12-15 NOTE — NC FL2 (Signed)
[place] LEVEL OF CARE SCREENING TOOL     IDENTIFICATION  Patient Name: Kelly Lee Birthdate: [birth year] Sex: female Admission Date (Current Location): 12/14/2021  [facility] and [place] Number:  Herbalist and Address:  [facility], [place] [address], [place], [place] [address]      Provider Number: [id number]  Attending Physician Name and Address:  Cherre Robins, MD  Relative Name and Phone Number:  Michael Boston [phone]    Current Level of Care: Hospital Recommended Level of Care: [place] Prior Approval Number:    Date Approved/Denied:   PASRR Number: [id number]  Discharge Plan: SNF    Current Diagnoses: Patient Active Problem List   Diagnosis Date Noted   Critical lower limb ischemia ([place]) 12/15/2021   Anemia 12/11/2019   S/p left hip fracture 12/07/2019   Diabetes mellitus without complication ([place])    Hypertension    Chronic respiratory failure with hypoxia ([place]) 09/26/2018   Dyslipidemia 09/26/2018   PAF (paroxysmal atrial fibrillation) ([place]) 09/26/2018   Anticoagulant long-term use 02/16/2018   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 02/16/2018   Morbid obesity with BMI of 40.0-44.9, adult ([place]) 02/16/2018   Typical atrial flutter ([place]) 02/15/2018   Hypothyroidism 01/10/2018   Type 2 diabetes mellitus, with long-term current use of insulin ([place]) 01/09/2018    Orientation RESPIRATION BLADDER Height & Weight     Self, Time, Situation, Place  Normal Incontinent (Urethral Catheter 16 Fr.) Weight: 187 lb 2.7 oz (84.9 kg) Height:  5\' 1"  (154.9 cm)  BEHAVIORAL SYMPTOMS/MOOD NEUROLOGICAL BOWEL NUTRITION STATUS      Continent (WDL) Diet (Please see discharge summary)  AMBULATORY STATUS COMMUNICATION OF NEEDS Skin   Limited Assist Verbally Other (Comment) (approp.for ethnic.,flaky,dry,PI coccyx mid stage 2,foam lift dressing,every 3  days,clean,dry,erythema,Incis. closed leg R,compression wrap,,dressing in place,Incis. closed groin R,neg. pressure wound therapy,dressing in place see additonal  info)                       Personal Care Assistance Level of Assistance  Bathing, Feeding, Dressing Bathing Assistance: Maximum assistance Feeding assistance: Independent (able to feed self) Dressing Assistance: Maximum assistance     Functional Limitations Info  Sight, Hearing, Speech Sight Info: Adequate (WDL) Hearing Info: Adequate (WDL) Speech Info: Adequate    SPECIAL CARE FACTORS FREQUENCY  PT (By licensed PT), OT (By licensed OT)     PT Frequency: 5x min weekly OT Frequency: 5x min weekly            Contractures Contractures Info: Not present    Additional Factors Info  Code Status, Allergies, Insulin Sliding Scale Code Status Info: FULL Allergies Info: Penicillins,Azithromycin,Drug Ingredient (cephalexin)   Insulin Sliding Scale Info: insulin aspart (novoLOG) injection 0-15 Units 3 times daily with meals,insulin glargine-yfgn (SEMGLEE) injection 35 Units daily at bedtime       Current Medications (12/15/2021):  This is the current hospital active medication list Current Facility-Administered Medications  Medication Dose Route Frequency Provider Last Rate Last Admin   0.9 %  sodium chloride infusion (Manually program via Guardrails IV Fluids)   Intravenous Once Rhyne, Samantha J, PA-C       0.9 %  sodium chloride infusion   Intravenous Continuous Rhyne, Hulen Shouts, PA-C 100 mL/hr at 12/15/21 0233 New Bag at 12/15/21 0233   acetaminophen (TYLENOL) tablet 325-650 mg  325-650 mg Oral Q4H PRN Gabriel Earing, PA-C  650 mg at 12/15/21 1210   Or   acetaminophen (TYLENOL) suppository 325-650 mg  325-650 mg Rectal Q4H PRN Rhyne, Samantha J, PA-C       albuterol (PROVENTIL) (2.5 MG/3ML) 0.083% nebulizer solution 3 mL  3 mL Inhalation Q6H PRN Rhyne, Samantha J, PA-C       alum & mag hydroxide-simeth  (MAALOX/MYLANTA) 200-200-20 MG/5ML suspension 15-30 mL  15-30 mL Oral Q2H PRN Rhyne, Samantha J, PA-C       bisacodyl (DULCOLAX) suppository 10 mg  10 mg Rectal Daily PRN Rhyne, Samantha J, PA-C       Chlorhexidine Gluconate Cloth 2 % PADS 6 each  6 each Topical Daily Cherre Robins, MD       clopidogrel (PLAVIX) tablet 75 mg  75 mg Oral Daily Rhyne, Samantha J, PA-C   75 mg at 12/15/21 1038   docusate sodium (COLACE) capsule 100 mg  100 mg Oral Daily Rhyne, Samantha J, PA-C   100 mg at 12/15/21 1039   empagliflozin (JARDIANCE) tablet 10 mg  10 mg Oral Daily Rhyne, Samantha J, PA-C   10 mg at 12/15/21 1039   ezetimibe (ZETIA) tablet 10 mg  10 mg Oral Daily Rhyne, Samantha J, PA-C   10 mg at 12/15/21 1039   fluticasone (FLONASE) 50 MCG/ACT nasal spray 1 spray  1 spray Each Nare Daily PRN Rhyne, Samantha J, PA-C       furosemide (LASIX) tablet 20 mg  20 mg Oral Daily Rhyne, Samantha J, PA-C   20 mg at 12/15/21 1039   guaiFENesin-dextromethorphan (ROBITUSSIN DM) 100-10 MG/5ML syrup 15 mL  15 mL Oral Q4H PRN Rhyne, Samantha J, PA-C       heparin ADULT infusion 100 units/mL (25000 units/250mL)  1,350 Units/hr Intravenous Continuous Rhyne, Samantha J, PA-C 13.5 mL/hr at 12/15/21 1541 1,350 Units/hr at 12/15/21 1541   hydrALAZINE (APRESOLINE) injection 5 mg  5 mg Intravenous Q20 Min PRN Rhyne, Samantha J, PA-C       insulin aspart (novoLOG) injection 0-15 Units  0-15 Units Subcutaneous TID WC Rhyne, Samantha J, PA-C   3 Units at 12/15/21 0653   insulin glargine-yfgn (SEMGLEE) injection 35 Units  35 Units Subcutaneous QHS Rhyne, Samantha J, PA-C       labetalol (NORMODYNE) injection 10 mg  10 mg Intravenous Q10 min PRN Rhyne, Samantha J, PA-C       magnesium sulfate IVPB 2 g 50 mL  2 g Intravenous Daily PRN Rhyne, Samantha J, PA-C       methimazole (TAPAZOLE) tablet 5 mg  5 mg Oral Daily Rhyne, Samantha J, PA-C   5 mg at 12/15/21 1039   metoprolol succinate (TOPROL-XL) 24 hr tablet 200 mg  200 mg Oral  Daily Rhyne, Samantha J, PA-C       metoprolol tartrate (LOPRESSOR) injection 2-5 mg  2-5 mg Intravenous Q2H PRN Rhyne, Samantha J, PA-C       mirabegron ER (MYRBETRIQ) tablet 50 mg  50 mg Oral Daily Rhyne, Samantha J, PA-C   50 mg at 12/15/21 1039   morphine (PF) 2 MG/ML injection 2 mg  2 mg Intravenous Q2H PRN Rhyne, Samantha J, PA-C       nitroGLYCERIN (NITROSTAT) SL tablet 0.4 mg  0.4 mg Sublingual Q5 min PRN Rhyne, Samantha J, PA-C       ondansetron (ZOFRAN) injection 4 mg  4 mg Intravenous Q6H PRN Rhyne, Samantha J, PA-C       oxyCODONE-acetaminophen (PERCOCET/ROXICET) 5-325 MG per tablet 1-2  tablet  1-2 tablet Oral Q4H PRN Gabriel Earing, PA-C   1 tablet at 12/15/21 1534   pantoprazole (PROTONIX) EC tablet 40 mg  40 mg Oral Daily Rhyne, Samantha J, PA-C   40 mg at 12/15/21 1039   phenol (CHLORASEPTIC) mouth spray 1 spray  1 spray Mouth/Throat PRN Rhyne, Samantha J, PA-C       potassium chloride SA (KLOR-CON M) CR tablet 20-40 mEq  20-40 mEq Oral Daily PRN Rhyne, Samantha J, PA-C       rosuvastatin (CRESTOR) tablet 20 mg  20 mg Oral QHS Kris Mouton, RPH       senna-docusate (Senokot-S) tablet 1 tablet  1 tablet Oral BID Gabriel Earing, PA-C   1 tablet at 12/15/21 1038   spironolactone (ALDACTONE) tablet 12.5 mg  12.5 mg Oral Daily Rhyne, Samantha J, PA-C   12.5 mg at 12/15/21 1040   tiZANidine (ZANAFLEX) tablet 4 mg  4 mg Oral TID PRN Gabriel Earing, PA-C   4 mg at 12/15/21 1414     Discharge Medications: Please see discharge summary for a list of discharge medications.  Relevant Imaging Results:  Relevant Lab Results:   Additional Information [id number],wound incision open or dehiced (MASD) breast L,R,raw,itchy,Wound Incision open or dehiced non-pressure wound ankle anterio,L,Red,warm,possible pus,erythema non blanchable,Negative pressure wound therapy,groin R,clean,dry,intact  Mil[place] Gain, LCSWA

## 2021-12-15 NOTE — Assessment & Plan Note (Addendum)
Hemoglobin 11.6 on admission Trended down to 8.1 Possibly hemodilution +/- blood loss during surgery She has been transfused a total of 2 unit prbc thus far Follow up hemoglobin stable at 10 >9.9 Continue to follow hemoglobin periodically at SNF.

## 2021-12-15 NOTE — Assessment & Plan Note (Addendum)
She is chronically on methimazole which was continued on admission

## 2021-12-15 NOTE — Progress Notes (Signed)
Pt's BP has been on 90's systolic. 500cc bolus administered. Even after bolus systolic pressure remained in 89'Q. Pt ao x4. Denies any dizziness. Received prn oxycodone at 0356. Pt has been complaining of pain, sometimes tingling. While I was in the room, pt stated she cannot feel her toes. Pt stated, " it went dead like earlier". Right DP and PT pulse has strong doppler signal. Checked pulses multiple times throughout this morning. Pt is anxious and nervous. Head of bed lowered to 15 degree. Will check BP again. Leaving A-line and Foley in place for now. Will continue to monitor.

## 2021-12-15 NOTE — Assessment & Plan Note (Addendum)
She is on multiple antihypertensives at home including Toprol, Lasix, Aldactone  These were continued on admission, but patient became hypotensive so they were placed on hold All these medications have not been resumed. Blood pressures controlled.

## 2021-12-15 NOTE — Progress Notes (Signed)
°  Echocardiogram 2D Echocardiogram has been performed.  Kelly Lee 12/15/2021, 11:40 AM

## 2021-12-15 NOTE — Assessment & Plan Note (Addendum)
Noted to have EF of 35-40% on echo from 12/01/2020 Echo repeated on 2/2 and EF noted to be normal Appears to be on GDMT Resuming metoprolol, lasix and aldactone. She is also on jardiance Clinically euvolemic. Pravastatin has been switched to rosuvastatin.

## 2021-12-15 NOTE — Progress Notes (Addendum)
Progress Note    12/15/2021 7:26 AM 1 Day Post-Op  Subjective:  says she has tingling in her right foot.  Says she had open heart surgery 23 years ago in [place].    Afebrile HR 50's-70's afib/SB [id number] systolic [id number]  Vitals:   12/15/21 0649 12/15/21 0653  BP: (!) 90/56 (!) 88/51  Pulse: (!) 52 (!) 56  Resp: 15 15  Temp:    SpO2: 97% 100%    Physical Exam: Cardiac:  regular Lungs:  non labored Incisions:  right groin with prevena in place with good seal.  No drainage in cannister.  RLE bandages removed-steady bloody ooze from medial incision.  Compression dressing reapplied.   Extremities:  palpable right DP pulse.     CBC    Component Value Date/Time   WBC 10.6 (H) 12/15/2021 0555   RBC 3.11 (L) 12/15/2021 0555   HGB 8.8 (L) 12/15/2021 0555   HCT 28.1 (L) 12/15/2021 0555   PLT 263 12/15/2021 0555   MCV 90.4 12/15/2021 0555   MCH 28.3 12/15/2021 0555   MCHC 31.3 12/15/2021 0555   RDW 14.6 12/15/2021 0555   LYMPHSABS 2.2 12/14/2021 1920   MONOABS 0.6 12/14/2021 1920   EOSABS 0.2 12/14/2021 1920   BASOSABS 0.1 12/14/2021 1920    BMET    Component Value Date/Time   NA 135 12/15/2021 0555   K 3.9 12/15/2021 0555   CL 100 12/15/2021 0555   CO2 26 12/15/2021 0555   GLUCOSE 192 (H) 12/15/2021 0555   BUN 12 12/15/2021 0555   CREATININE 0.87 12/15/2021 0555   CALCIUM 7.8 (L) 12/15/2021 0555   GFRNONAA >60 12/15/2021 0555   GFRAA >60 12/17/2019 0347    INR No results found for: INR   Intake/Output Summary (Last 24 hours) at 12/15/2021 0726 Last data filed at 12/15/2021 0300 Gross per 24 hour  Intake 434.33 ml  Output 500 ml  Net -65.67 ml     Assessment/Plan:  71 y.o. female is s/p:  1) right lower extremity arterial thrombectomy 2) right calf four compartment fasciotomy  1 Day Post-Op   -pt with palpable right DP pulse. -moderate bloody ooze from medial fasciotomy incision-compression dressing reapplied.  Explained to pt about  fasciotomy incisions. -acute blood loss anemia-pt tolerating currently but may require blood transfusion.  Discussed with Dr. Stanford Breed and will hold heparin until 3pm today.  Discussed with RN.  -pt with hx of PAF-has been off Eliquis.  Pt currently on heparin gtt.  2D echo ordered for today.     Leontine Locket, PA-C [facility] [phone] 12/15/2021 7:26 AM   Addendum:  pt hypotensive and has some dizziness with sitting on side of the bed.  Otherwise, she is asymptomatic.  Hgb this am was 8.8.  given hypotension and moderate bloody ooze with dressing change this am, discussed with Dr. Stanford Breed and will transfuse one unit PRBC at this time.  Will get stat CBC and T&S and she will be transferred to the unit.   -heparin gtt on hold until  today at 1500.   Leontine Locket, [facility] 12/15/2021 9:56 AM  VASCULAR STAFF ADDENDUM: I have independently interviewed and examined the patient. I agree with the above.  Some expected bleeding from fasciotomy site overnight.  Pressures late morning fell into the Q000111Q and [id number] systolic.  She did briefly respond to fluids, but then drifted back into the 0000000 systolic.  Repeat hemoglobin fairly stable.  I suspect she may have suffered a cardiac  event.  I am planning to transfuse her a unit of blood.  We will transfer her to the intensive care unit for closer monitoring.  Check an EKG.  Check troponins.  Echocardiogram in process.  Yevonne Aline. Stanford Breed, MD [facility] Phone Number: [phone] 12/15/2021 12:10 PM

## 2021-12-15 NOTE — Progress Notes (Signed)
[place] for heparin Indication:  Acute arterial occlusion  Allergies  Allergen Reactions   Penicillins Hives and Shortness Of Breath   Azithromycin Hives   Drug Ingredient [Cephalexin] Hives    Patient Measurements: Height: 5\' 1"  (154.9 cm) Weight: 84.9 kg (187 lb 2.7 oz) IBW/kg (Calculated) : 47.8 Heparin Dosing Weight: 86 kg   Vital Signs: Temp: 97.7 F (36.5 C) (02/02 0332) Temp Source: Oral (02/02 0332) BP: 88/51 (02/02 0653) Pulse Rate: 56 (02/02 0653)  Labs: Recent Labs    12/14/21 1920 12/14/21 2004 12/15/21 0555  HGB 11.6* 10.9* 8.8*  HCT 36.9 32.0* 28.1*  PLT 310  --  263  CREATININE 1.08* 1.00 0.87    Estimated Creatinine Clearance: 58.6 mL/min (by C-G formula based on SCr of 0.87 mg/dL).   Medical History: Past Medical History:  Diagnosis Date   Closed intertrochanteric fracture, left, initial encounter ([place]) 12/08/2019   Diabetes mellitus without complication (HCC)    Hypertension     Medications:  Medications Prior to Admission  Medication Sig Dispense Refill Last Dose   albuterol (VENTOLIN HFA) 108 (90 Base) MCG/ACT inhaler Inhale 2 puffs into the lungs every 6 (six) hours as needed for wheezing or shortness of breath.   08/25/2021   clopidogrel (PLAVIX) 75 MG tablet Take 75 mg by mouth daily.   12/13/2021 at 6 pm   empagliflozin (JARDIANCE) 10 MG TABS tablet Take 10 mg by mouth daily.   12/13/2021   ezetimibe (ZETIA) 10 MG tablet Take 10 mg by mouth daily.   12/13/2021   fluticasone (FLONASE) 50 MCG/ACT nasal spray Place 1 spray into both nostrils daily as needed for allergies.   Past Week   furosemide (LASIX) 40 MG tablet Take 20 mg by mouth daily.    12/13/2021   LANTUS SOLOSTAR 100 UNIT/ML Solostar Pen Inject 35 Units into the skin at bedtime.   12/13/2021   metFORMIN (GLUCOPHAGE) 500 MG tablet Take 500 mg by mouth 3 (three) times daily.   12/14/2021   methimazole (TAPAZOLE) 5 MG tablet Take 5 mg by mouth  daily.   12/11/2021   metoprolol (TOPROL-XL) 200 MG 24 hr tablet Take 200 mg by mouth daily.   12/13/2021 at 7 pm   MYRBETRIQ 50 MG TB24 tablet Take 50 mg by mouth daily.   12/13/2021   nitroGLYCERIN (NITROSTAT) 0.4 MG SL tablet Place 0.4 mg under the tongue every 5 (five) minutes as needed for chest pain.    unk last dose   nystatin (MYCOSTATIN/NYSTOP) powder Apply 1 application topically 3 (three) times daily as needed (rash).   Past Month   pantoprazole (PROTONIX) 40 MG tablet Take 40 mg by mouth daily.   12/13/2021   pravastatin (PRAVACHOL) 40 MG tablet Take 40 mg by mouth at bedtime.   12/13/2021   spironolactone (ALDACTONE) 25 MG tablet Take 12.5 mg by mouth daily.   12/13/2021   tiZANidine (ZANAFLEX) 4 MG tablet Take 4 mg by mouth 3 (three) times daily as needed for muscle spasms.    12/14/2021   HYDROcodone-acetaminophen (NORCO) 5-325 MG tablet Take 1-2 tablets by mouth every 6 (six) hours as needed for moderate pain or severe pain. (Patient not taking: Reported on 12/14/2021) 40 tablet 0 Not Taking   polyethylene glycol (MIRALAX / GLYCOLAX) 17 g packet Take 17 g by mouth daily as needed for mild constipation. (Patient not taking: Reported on 12/14/2021) 14 each 0 Not Taking   senna-docusate (SENOKOT-S) 8.6-50 MG tablet  Take 1 tablet by mouth 2 (two) times daily. (Patient not taking: Reported on 12/14/2021)   Not Taking    Assessment: 60 YOF with pain in her R leg and loss of pulses in extremity concerning for acute arterial occlusion. She is now s/p thrombectomy/fasciotomy on 2/1 and heparin held this morning due to bleeding from the  fasciotomy incisions -heparin on hold until 3pm -Hg= 8.8 w/ trend down  Goal of Therapy:  Heparin level 0.3-0.7 units/ml Monitor platelets by anticoagulation protocol: Yes   Plan:  -Resume heparin 1350 units/hr at 3pm -Check heparin level in 8 hrs  Hildred Laser, PharmD Clinical Pharmacist **Pharmacist phone directory can now be found on [email or web address] (PW [email or web address]).   Listed under [place].

## 2021-12-15 NOTE — Assessment & Plan Note (Addendum)
Chronically on metoprolol 200 mg daily Noted to be sinus bradycardia on admission Dose of metoprolol confirmed with patient's daughter Her CHA2DS2-VASc score of 8 Previous DC summary from [facility] in 11/2020 indicated that she was discharged with eliquis Patient reports that at some point Eliquis was discontinued Her daughter is unaware of any contraindication to anticoagulation She was initially treated with IV heparin since admission and was transitioned to Eliquis on 2/6 Anticoagulation had to be held due to development of hematoma at incision site Restarted Eliquis by vascular surgery at time of discharge. Heart rate has been stable on metoprolol, although this is a reduced dose.  Appears that prior to admission, she was on Toprol-XL 200 Mg daily. We will continue reduced dose until she can follow-up with her primary cardiologist

## 2021-12-15 NOTE — Assessment & Plan Note (Signed)
Chronic. 

## 2021-12-15 NOTE — Transfer of Care (Signed)
Immediate Anesthesia Transfer of Care Note  Patient: Kelly Lee  Procedure(s) Performed: RIGHT LEG ARTERIAL THROMBECTOMY (Right: Leg Upper) RIGHT LOWER FASCIOTOMY (Right: Leg Lower)  Patient Location: PACU  Anesthesia Type:General  Level of Consciousness: awake, alert  and oriented  Airway & Oxygen Therapy: Patient Spontanous Breathing and Patient connected to nasal cannula oxygen  Post-op Assessment: Report given to RN, Post -op Vital signs reviewed and stable and Patient moving all extremities X 4  Post vital signs: Reviewed and stable  Last Vitals:  Vitals Value Taken Time  BP 94/56 12/15/21 0016  Temp    Pulse 63 12/15/21 0018  Resp 17 12/15/21 0018  SpO2 95 % 12/15/21 0018  Vitals shown include unvalidated device data.  Last Pain:  Vitals:   12/14/21 2053  PainSc: 8          Complications: No notable events documented.

## 2021-12-15 NOTE — Progress Notes (Signed)
Consultation Progress Note   Patient: Kelly Lee [id number] DOB: May 10, 1951 DOA: 12/14/2021 DOS: the patient was seen and examined on 12/15/2021 Primary service: Leonie Douglas, MD  Brief hospital course: 71 y/o female admitted to the hospital with acute right leg ischemia secondary to arterial thrombus. She was admitted to the hospital under vascular surgery and underwent thrombectomy and 4 compartment fasciotomy. Post operatively, she developed hypotension and anemia requiring transfer to [facility] for closer monitoring. [facility] hospitalists asked to follow patient for medical management  Assessment and Plan: * Critical lower limb ischemia (HCC)- (present on admission) Admitted to vascular surgery service. S/p right lower leg arterial thrombectomy and right four compartment fasciotomy. Currently on IV heparin. Management per vascular surgery service.  Cardiomyopathy (HCC) Noted to have EF of 35-40% on echo from 12/01/2020 Echo repeated on 2/2 and EF noted to be normal Appears to be on GDMT Resume therapies as blood pressures will allow  Hypotension Noted to be hypotensive today, transferred to [facility] for closer monitoring troponins normal, Echo with normal EF Possibly related to anemia and blood loss Hold antihypertensives Follow hemoglobin  Anemia- (present on admission) Hemoglobin 11.6 on admission Trended down to 8.1 Possibly hemodilution +/- blood loss during surgery She was transfused 1 unit prbc today due to concurrent hypotension Continue to follow hemoglobin  Type 2 diabetes mellitus, with long-term current use of insulin (HCC) She takes Lantus, metformin and jardiance as outpatient Continued on lantus and SSI since admission Resume outpatient regimen on discharge  PAF (paroxysmal atrial fibrillation) (HCC)- (present on admission) Chronically on metoprolol 200 mg daily Noted to be sinus bradycardia on admission Dose of metoprolol confirmed with patient's daughter Her  CHA2DS2-VASc score of 8 Previous DC summary from [facility] in 11/2020 indicated that she was discharged with eliquis Patient reports that at some point this was discontinued Her daughter is unaware of any contraindication to anticoagulation Would consider resuming anticoagulation with eliquis prior to discharge Since she is hypotension now, will hold further metoprolol and monitor BP/HR  Morbid obesity with BMI of 40.0-44.9, adult (HCC) Chronic  Hyperthyroidism- (present on admission) She is chronically on methimazole which was continued on admission  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 She has not had any chest pain Cardiac enzymes checked today noted to be negative Echocardiogram shows normal ejection fraction  Chronic respiratory failure with hypoxia (HCC)- (present on admission) Appears to chronically be on 2L Resp status currently stable  Hypertension- (present on admission) She is on multiple antihypertensives at home including Toprol, Lasix, Aldactone  These were continued on admission, patient became hypotensive Will hold further metoprolol and diuretics for now pending stabilization of blood pressure         TRH will continue to follow the patient.  Subjective: she denies any shortness of breath or chest pain  Physical Exam: Vitals:   12/15/21 1530 12/15/21 1534 12/15/21 1900 12/15/21 2000  BP:   (!) 116/102 (!) 85/73  Pulse: 90 90 66 68  Resp: (!) 22 (!) 22 13 16   Temp: (!) 97.5 F (36.4 C)   98.3 F (36.8 C)  TempSrc: Oral   Oral  SpO2: 93% 90% 93% 98%  Weight:      Height:       General exam: Alert, awake, oriented x 3 Respiratory system: Clear to auscultation. Respiratory effort normal. Cardiovascular system:RRR. No murmurs, rubs, gallops. Gastrointestinal system: Abdomen is nondistended, soft and nontender. No organomegaly  or masses  felt. Normal bowel sounds heard. Central nervous system: Alert and oriented. No focal neurological deficits. Extremities: RLE wrapped in compression dressing Skin: No rashes, lesions or ulcers Psychiatry: Judgement and insight appear normal. Mood & affect appropriate.    Data Reviewed:  Reviewed cbc and chemistry. Reviewed Echo. Ordered repeat CBC and chemistry for AM  Family Communication: updated patient's daughter   Time spent: 40 minutes.  Author: Erick Blinks, MD 12/15/2021 8:23 PM  For on call review [email or web address].

## 2021-12-15 NOTE — Progress Notes (Signed)
PHARMACIST LIPID MONITORING   Kelly Lee is a 71 y.o. female admitted on 12/14/2021 with PVD.  Pharmacy has been consulted to optimize lipid-lowering therapy with the indication of secondary prevention for clinical ASCVD.  Recent Labs:  Lipid Panel (last 6 months):   Lab Results  Component Value Date   CHOL 109 12/15/2021   TRIG 58 12/15/2021   HDL 27 (L) 12/15/2021   CHOLHDL 4.0 12/15/2021   VLDL 12 12/15/2021   LDLCALC 70 12/15/2021    Hepatic function panel (last 6 months):   No results found for: AST, ALT, ALKPHOS, BILITOT, BILIDIR, IBILI  SCr (since admission):   Serum creatinine: 0.87 mg/dL 32/95/18 8416 Estimated creatinine clearance: 58.6 mL/min  Current therapy and lipid therapy tolerance Current lipid-lowering therapy: Pravastatin Previous lipid-lowering therapies (if applicable):  Documented or reported allergies or intolerances to lipid-lowering therapies (if applicable): None (she has been on pravastatin for years)  Assessment:   Patient agrees with changes to lipid-lowering therapy  Plan:    1.Statin intensity (high intensity recommended for all patients regardless of the LDL):  Add or increase statin to high intensity.  2.Add ezetimibe (if any one of the following):   Not indicated at this time.  3.Refer to lipid clinic:   No  4.Follow-up with:  Primary care provider - Oval Linsey, PA-C  5.Follow-up labs after discharge:  Changes in lipid therapy were made. Check a lipid panel in 8-12 weeks then annually.      Harland German, PharmD Clinical Pharmacist **Pharmacist phone directory can now be found on [email or web address] (PW [email or web address]).  Listed under [facility].

## 2021-12-15 NOTE — Assessment & Plan Note (Addendum)
She takes Lantus, metformin and jardiance as outpatient Continued on lantus and SSI since admission Resume outpatient regimen on discharge Adjusting lantus and meal coverage novolog based on blood sugars -Blood sugars appear to be reasonably controlled here.

## 2021-12-15 NOTE — Progress Notes (Signed)
Notified Samantha PA-C that pt's SBP has been in 90's and 80's even after 500 cc NS bolus, and I have left A-line and Foley in place for now. Notified pt is non-symptomatic. She said she will go check on patient. Will continue to monitor.

## 2021-12-15 NOTE — Anesthesia Postprocedure Evaluation (Signed)
Anesthesia Post Note  Patient: Coral Spikes  Procedure(s) Performed: RIGHT LEG ARTERIAL THROMBECTOMY (Right: Leg Upper) RIGHT LOWER FASCIOTOMY (Right: Leg Lower)     Patient location during evaluation: PACU Anesthesia Type: General Level of consciousness: sedated Pain management: pain level controlled Vital Signs Assessment: post-procedure vital signs reviewed and stable Respiratory status: spontaneous breathing Cardiovascular status: stable Anesthetic complications: no   No notable events documented.  Last Vitals:  Vitals:   12/15/21 0216 12/15/21 0217  BP:    Pulse: (!) 50 (!) 51  Resp: 17 17  Temp:    SpO2: 100% 97%    Last Pain:  Vitals:   12/15/21 0208  TempSrc: Oral  PainSc: 0-No pain                 Lewie Loron

## 2021-12-15 NOTE — TOC Initial Note (Signed)
Transition of Care [facility]) - Initial/Assessment Note    Patient Details  Name: Kelly Lee MRN: [id number] Date of Birth: Nov 19, 1950  Transition of Care [facility]) CM/SW Contact:    Milas Gain, [place] Phone Number: 12/15/2021, 3:42 PM  Clinical Narrative:                  CSW received consult for possible SNF placement at time of discharge. CSW spoke with patient at bedside regarding PT recommendation of SNF placement at time of discharge. Patient reports she comes from home with daughter and daughters boyfriend and grandchildren.Patient expressed understanding of PT recommendation and is agreeable to SNF placement at time of discharge. Patient gave CSW permission to fax out initial referral near local SNF facilities.CSW discussed insurance authorization process with patent and CSW will provide patient with medicare compare ratings list with accepted SNF bed offers when available. Patient has not received the COVID vaccines. No further questions reported at this time. CSW to continue to follow and assist with discharge planning needs.   Expected Discharge Plan: Skilled Nursing Facility Barriers to Discharge: Continued Medical Work up   Patient Goals and CMS Choice Patient states their goals for this hospitalization and ongoing recovery are:: SNF CMS Medicare.gov Compare Post Acute Care list provided to:: Patient Choice offered to / list presented to : Patient  Expected Discharge Plan and Services Expected Discharge Plan: [place] In-house Referral: Clinical Social Work     Living arrangements for the past 2 months: [place]                                      Prior Living Arrangements/Services Living arrangements for the past 2 months: Single Family Home Lives with:: Self, Adult Children, Other (Comment) (daughter daughters boyfriend and grandchildren) Patient language and need for interpreter reviewed:: Yes Do you feel safe going back to the  place where you live?: No   SNF  Need for Family Participation in Patient Care: Yes (Comment) Care giver support system in place?: Yes (comment)   Criminal Activity/Legal Involvement Pertinent to Current Situation/Hospitalization: No - Comment as needed  Activities of Daily Living Home Assistive Devices/Equipment: Wheelchair, Environmental consultant (specify type) ADL Screening (condition at time of admission) Patient's cognitive ability adequate to safely complete daily activities?: Yes Is the patient deaf or have difficulty hearing?: No Does the patient have difficulty seeing, even when wearing glasses/contacts?: No Does the patient have difficulty concentrating, remembering, or making decisions?: No Patient able to express need for assistance with ADLs?: Yes Does the patient have difficulty dressing or bathing?: No Independently performs ADLs?: Yes (appropriate for developmental age) Does the patient have difficulty walking or climbing stairs?: Yes Weakness of Legs: Both Weakness of Arms/Hands: None  Permission Sought/Granted Permission sought to share information with : Case Manager, Family Supports, Chartered certified accountant granted to share information with : Yes, Verbal Permission Granted  Share Information with NAME: Dorene  Permission granted to share info w AGENCY: SNF  Permission granted to share info w Relationship: daughter  Permission granted to share info w Contact Information: Michael [place] [phone]  Emotional Assessment Appearance:: Appears stated age Attitude/Demeanor/Rapport: Gracious Affect (typically observed): Calm Orientation: : Oriented to Self, Oriented to Place, Oriented to  Time, Oriented to Situation Alcohol / Substance Use: Not Applicable Psych Involvement: No (comment)  Admission diagnosis:  Critical lower limb ischemia ([place]) [I70.229] Patient Active Problem List  Diagnosis Date Noted   Critical lower limb ischemia ([place]) 12/15/2021   Anemia  12/11/2019   S/p left hip fracture 12/07/2019   Diabetes mellitus without complication ([place])    Hypertension    Chronic respiratory failure with hypoxia ([place]) 09/26/2018   Dyslipidemia 09/26/2018   PAF (paroxysmal atrial fibrillation) ([place]) 09/26/2018   Anticoagulant long-term use 02/16/2018   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 02/16/2018   Morbid obesity with BMI of 40.0-44.9, adult ([place]) 02/16/2018   Typical atrial flutter ([place]) 02/15/2018   Hypothyroidism 01/10/2018   Type 2 diabetes mellitus, with long-term current use of insulin ([place]) 01/09/2018   PCP:  Theodosia Blender, PA-C Pharmacy:   [id number] [place], [address] [place] [address] Phone: [phone] Fax: [phone]  [facility] [address] [place] [address] Phone: [phone] Fax: [phone]     Social Determinants of Health (SDOH) Interventions    Readmission Risk Interventions Readmission Risk Prevention Plan 12/12/2019  Transportation Screening Complete  PCP or Specialist Appt within 5-7 Days Not Complete  Not Complete comments plan for SNF  Home Care Screening Complete  Medication Review (RN CM) Referral to Pharmacy  Some recent data might be hidden

## 2021-12-15 NOTE — Assessment & Plan Note (Addendum)
On Plavix, Zetia, Pravachol. Cardiac enzymes checked and noted to be negative Echocardiogram shows normal ejection fraction Had some atypical chest pain overnight, now resolved without recurrence.

## 2021-12-15 NOTE — Subjective & Objective (Signed)
Reason for internal medicine consultation: Diabetes, hypertension, history of A. Fib Requesting physician: Luan Pulling MD Chief complaint: Critical right leg ischemia History present illness: 71 year old female lives at home with her daughter presented to the ER today with right leg ischemia.  Patient states that sometime this afternoon, she could not feel her right leg.  Her right foot was getting increasingly cool to touch.  She waited an hour before calling EMS.  She arrived to the ER.  No pulses were felt in the right limb.  No pedal pulses could be dopplered.  Vascular surgery saw the patient.  They felt the patient needed emergent surgery.  Patient taken the OR tonight for a fasciotomy along with arterial thrombectomy.  Postoperatively, patient was hemodynamically stable.  Consultation for medical management.  Patient states that she was taken off Eliquis about a year ago according to her memory.  I reviewed her chart, there is no mention any of her hospital discharge summaries about stopping Eliquis.  Patient had a rectus sheath hematoma bleed while she was taking Plavix in the past.  [facility] hospitalist consulted for medical management.

## 2021-12-15 NOTE — TOC Benefit Eligibility Note (Signed)
Patient Advocate Encounter ° °Insurance verification completed.   ° °The patient is currently admitted and upon discharge could be taking Eliquis 5 mg. ° °The current 30 day co-pay is, $0.00.  ° °The patient is currently admitted and upon discharge could be taking Xarelto 20 mg. ° °The current 30 day co-pay is, $0.00.  ° °The patient is insured through [employer] Medicare Part D  ° ° ° °Chistian Kasler, CPhT °Pharmacy Patient Advocate Specialist °[facility] Patient Advocate Team °Direct Number: [phone]  Fax: [phone] ° ° ° ° ° °  °

## 2021-12-15 NOTE — Plan of Care (Signed)

## 2021-12-15 NOTE — Hospital Course (Addendum)
71 y/o female admitted to the hospital with acute right leg ischemia secondary to arterial thrombus. She was admitted to the hospital under vascular surgery and underwent thrombectomy and 4 compartment fasciotomy. Post operatively, she developed hypotension and anemia requiring transfer to [facility] for closer monitoring. [facility] hospitalists asked to follow patient for medical management.  Determined to be medically stable and being discharged to [facility] 12/21/2021 by vascular surgery.

## 2021-12-15 NOTE — Assessment & Plan Note (Addendum)
Admitted to vascular surgery service. S/p right lower leg arterial thrombectomy and right four compartment fasciotomy.  She was treated with IV heparin and transitioned to Eliquis on 2/6.  Unfortunately, anticoagulation had to be held due to development of hematoma at incision site.  Held further anticoagulation for now and Eliquis were resumed at discharge. Management per vascular surgery service.  As per vascular surgery follow-up, old hematoma evacuated from lateral fasciotomy site at bedside today.  Has multiple staples and some soreness at right groin incision site with appropriate surrounding bruising.  No acute findings.  Wound VAC has been removed from the site

## 2021-12-15 NOTE — Evaluation (Signed)
Occupational Therapy Evaluation Patient Details Name: Kelly Lee MRN: [id number] DOB: 07/05/1951 Today's Date: 12/15/2021   History of Present Illness 71 y.o. female presenting to ED 2/1 with RLE acute critical limb ischemia s/p emergent RLE arterial thrombectomy and R calf four compartment fasciotomy 12/15/21. Admission complicated by post-op hypotension with SPB in 90's. PMHx significant for DMII, HTN, paroxysmal A-fib, diastolic HF and fall with L hip fx 11/2019.   Clinical Impression   PTA patient was living with family in a private residence and was grossly Mod I from wc level with ADLs. Family assists with housekeeping and meal prep. Patient currently functioning below baseline demonstrating observed ADLs with Min to Mod A per clinical judgement and functional assessment. Patient declining standing secondary to anticipation of increased pain despite education on WBAT status in RLE. With increased encouragement patient with continued hesitance. Patient also limited by deficits listed below including pain and decreased cardiopulmonary status and would benefit from continued acute OT services in prep for safe d/c to next level of care. OT will continue to follow acutely. Patient reports that family in not able to provide initial 24hr supervision/assist with desire to d/c to short-term SNF rehab prior to return home.   Per art line BP 92/45 in supine. Patient denies dizziness/lightheadedness. Seated EOB BP improved to 125/76. HR 66bpm.        Recommendations for follow up therapy are one component of a multi-disciplinary discharge planning process, led by the attending physician.  Recommendations may be updated based on patient status, additional functional criteria and insurance authorization.   Follow Up Recommendations  Skilled nursing-short term rehab (<3 hours/day)    Assistance Recommended at Discharge Frequent or constant Supervision/Assistance  Patient can return home with the  following A little help with walking and/or transfers;A little help with bathing/dressing/bathroom;Assistance with cooking/housework;Assist for transportation    Functional Status Assessment  Patient has had a recent decline in their functional status and demonstrates the ability to make significant improvements in function in a reasonable and predictable amount of time.  Equipment Recommendations  Other (comment) (repairs to hospital bed)    Recommendations for Other Services       Precautions / Restrictions Precautions Precautions: Fall Precaution Comments: Hx of falls; monitor BP; wound vac Restrictions Weight Bearing Restrictions: Yes RLE Weight Bearing: Weight bearing as tolerated      Mobility Bed Mobility Overal bed mobility: Needs Assistance Bed Mobility: Supine to Sit, Sit to Supine     Supine to sit: Min guard Sit to supine: Min guard   General bed mobility comments: Able to advance BLE toward EOB with Min gaurd at RLE for pain management. HOB elevated +rail. Return to supine in same manner with Min guard at RLE.    Transfers Overall transfer level: Needs assistance Equipment used: None               General transfer comment: Patient completed lateral scoots at EOB with supervision A. Hesitant to bear weight through RLE 2/2 anticipation of pain despite education on WBAT.      Balance Overall balance assessment: Needs assistance Sitting-balance support: No upper extremity supported (LLE supported on ground; maintains RLE off the ground in anticipation of pain.) Sitting balance-Leahy Scale: Good Sitting balance - Comments: Lateral scoots at EOB without LOB.       Standing balance comment: Declined standing in anticipation of pain  ADL either performed or assessed with clinical judgement   ADL Overall ADL's : Needs assistance/impaired     Grooming: Set up;Sitting   Upper Body Bathing: Set up;Sitting   Lower  Body Bathing: Moderate assistance;Sit to/from stand Lower Body Bathing Details (indicate cue type and reason): Did not assess Upper Body Dressing : Set up;Sitting   Lower Body Dressing: Moderate assistance;Sit to/from stand Lower Body Dressing Details (indicate cue type and reason): Did not assess   Toilet Transfer Details (indicate cue type and reason): Did not assess           General ADL Comments: Patient declined standing and functional transfers this a.m.     Vision   Vision Assessment?: No apparent visual deficits     Perception     Praxis      Pertinent Vitals/Pain Pain Assessment Pain Assessment: 0-10 Pain Score: 7  Pain Location: R groin and RLE (surgical) Pain Descriptors / Indicators: Throbbing, Tender Pain Intervention(s): Limited activity within patient's tolerance, Monitored during session, Premedicated before session, Repositioned     Hand Dominance Right   Extremity/Trunk Assessment Upper Extremity Assessment Upper Extremity Assessment: Overall WFL for tasks assessed   Lower Extremity Assessment Lower Extremity Assessment: Defer to PT evaluation (Reports absence of RLE numbness present on arrival to ED)   Cervical / Trunk Assessment Cervical / Trunk Assessment: Other exceptions Cervical / Trunk Exceptions: Large body habitus   Communication Communication Communication: No difficulties   Cognition Arousal/Alertness: Awake/alert Behavior During Therapy: WFL for tasks assessed/performed Overall Cognitive Status: Within Functional Limits for tasks assessed                                       General Comments  Concerns with BP this a.m. At rest BP 92/45 via art line. Seated EOB BP improved to 125/76. Denies lightheadedness/dizziness. Patient very hesitant to bear weight through RLE. States "I'll do that when I feel like I can" when encouraged to attempt standing at EOB. Patient demonstrates good dynamic sitting balance and good BUE  strength indicating ability to safely complete sit to stand transfer from EOB to RW with 1 person assist.    Exercises Exercises: General Lower Extremity General Exercises - Lower Extremity Straight Leg Raises: Right, 5 reps, Supine Toe Raises: Right, 5 reps, Supine Heel Raises: Right, 5 reps, Supine   Shoulder Instructions      Home Living Family/patient expects to be discharged to:: Private residence Living Arrangements: Children Available Help at Discharge: Family;Available PRN/intermittently Type of Home: House Home Access: Ramped entrance     Home Layout: One level     Bathroom Shower/Tub: Tub/shower unit;Sponge bathes at baseline   Bathroom Toilet: Standard (BSC over toilet)     Home Equipment: Wheelchair - manual;BSC/3in1;Tub bench;Rolling Walker (2 wheels);Hospital bed   Additional Comments: Reports hospital bed is broken      Prior Functioning/Environment Prior Level of Function : Needs assist       Physical Assist : Mobility (physical);ADLs (physical) Mobility (physical): Bed mobility;Transfers ADLs (physical): IADLs Mobility Comments: Reports completing stand-pivot transfers to wc at home, wc for household and community mobility. RW short-distances to commode in bathroom as wc does not fit. ADLs Comments: Reports Mod I from wc level; family assists with IADLs        OT Problem List: Decreased strength;Decreased activity tolerance;Impaired balance (sitting and/or standing);Decreased safety awareness;Cardiopulmonary status limiting activity;Pain  OT Treatment/Interventions: Self-care/ADL training;Therapeutic exercise;Energy conservation;DME and/or AE instruction;Therapeutic activities;Patient/family education;Balance training    OT Goals(Current goals can be found in the care plan section) Acute Rehab OT Goals Patient Stated Goal: Patient in agreement with SNF rehab OT Goal Formulation: With patient Time For Goal Achievement: 12/29/21 Potential to  Achieve Goals: Good ADL Goals Pt Will Perform Lower Body Dressing: with supervision;sit to/from stand Pt Will Transfer to Toilet: with supervision;bedside commode;ambulating Pt Will Perform Toileting - Clothing Manipulation and hygiene: with supervision;sit to/from stand Additional ADL Goal #1: Patient will complete sit to stand transfers to RW with supervision A in prep for ADLs.  OT Frequency: Min 2X/week    Co-evaluation              AM-PAC OT "6 Clicks" Daily Activity     Outcome Measure Help from another person eating meals?: None Help from another person taking care of personal grooming?: A Little Help from another person toileting, which includes using toliet, bedpan, or urinal?: A Lot Help from another person bathing (including washing, rinsing, drying)?: A Lot Help from another person to put on and taking off regular upper body clothing?: A Little Help from another person to put on and taking off regular lower body clothing?: A Lot 6 Click Score: 16   End of Session Equipment Utilized During Treatment: Oxygen (2L via [place]) Nurse Communication: Mobility status;Other (comment) (BP improved seated EOB)  Activity Tolerance: Patient tolerated treatment well;Other (comment) (Declined standing) Patient left: in bed;with call bell/phone within reach;with bed alarm set  OT Visit Diagnosis: Unsteadiness on feet (R26.81);Other abnormalities of gait and mobility (R26.89);Muscle weakness (generalized) (M62.81);Pain Pain - Right/Left: Right Pain - part of body: Leg                Time: 8921-1941 OT Time Calculation (min): 25 min Charges:  OT General Charges $OT Visit: 1 Visit OT Evaluation $OT Eval Low Complexity: 1 Low OT Treatments $Self Care/Home Management : 8-22 mins  Thoren Hosang H. OTR/L Supplemental OT, Department of rehab services [phone]  Kathyjo Briere R H. 12/15/2021, 9:00 AM

## 2021-12-15 NOTE — Assessment & Plan Note (Addendum)
Appears to chronically be on 2L, however currently saturating in the high 90s on room air. Resp status currently stable and continue to monitor oxygen saturations closely at SNF.

## 2021-12-16 DIAGNOSIS — I429 Cardiomyopathy, unspecified: Secondary | ICD-10-CM | POA: Diagnosis not present

## 2021-12-16 DIAGNOSIS — I70229 Atherosclerosis of native arteries of extremities with rest pain, unspecified extremity: Secondary | ICD-10-CM | POA: Diagnosis not present

## 2021-12-16 DIAGNOSIS — I70209 Unspecified atherosclerosis of native arteries of extremities, unspecified extremity: Secondary | ICD-10-CM | POA: Diagnosis not present

## 2021-12-16 DIAGNOSIS — D649 Anemia, unspecified: Secondary | ICD-10-CM | POA: Diagnosis not present

## 2021-12-16 LAB — BASIC METABOLIC PANEL
Anion gap: 8 (ref 5–15)
BUN: 15 mg/dL (ref 8–23)
CO2: 25 mmol/L (ref 22–32)
Calcium: 8 mg/dL — ABNORMAL LOW (ref 8.9–10.3)
Chloride: 100 mmol/L (ref 98–111)
Creatinine, Ser: 1.04 mg/dL — ABNORMAL HIGH (ref 0.44–1.00)
GFR, Estimated: 57 mL/min — ABNORMAL LOW (ref >60)
Glucose, Bld: 222 mg/dL — ABNORMAL HIGH (ref 70–99)
Potassium: 3.6 mmol/L (ref 3.5–5.1)
Sodium: 133 mmol/L — ABNORMAL LOW (ref 135–145)

## 2021-12-16 LAB — SURGICAL PATHOLOGY

## 2021-12-16 LAB — GLUCOSE, CAPILLARY
Glucose-Capillary: 194 mg/dL — ABNORMAL HIGH (ref 70–99)
Glucose-Capillary: 222 mg/dL — ABNORMAL HIGH (ref 70–99)
Glucose-Capillary: 203 mg/dL — ABNORMAL HIGH (ref 70–99)
Glucose-Capillary: 229 mg/dL — ABNORMAL HIGH (ref 70–99)
Glucose-Capillary: 191 mg/dL — ABNORMAL HIGH (ref 70–99)

## 2021-12-16 LAB — PREPARE RBC (CROSSMATCH)

## 2021-12-16 LAB — HEPARIN LEVEL (UNFRACTIONATED)
Heparin Unfractionated: 0.55 IU/mL (ref 0.30–0.70)
Heparin Unfractionated: 0.78 IU/mL — ABNORMAL HIGH (ref 0.30–0.70)
Heparin Unfractionated: 0.58 IU/mL (ref 0.30–0.70)

## 2021-12-16 LAB — CBC
HCT: 25.3 % — ABNORMAL LOW (ref 36.0–46.0)
Hemoglobin: 8.5 g/dL — ABNORMAL LOW (ref 12.0–15.0)
MCH: 29.9 pg (ref 26.0–34.0)
MCHC: 33.6 g/dL (ref 30.0–36.0)
MCV: 89.1 fL (ref 80.0–100.0)
Platelets: 235 10*3/uL (ref 150–400)
RBC: 2.84 MIL/uL — ABNORMAL LOW (ref 3.87–5.11)
RDW: 14.7 % (ref 11.5–15.5)
WBC: 11.6 10*3/uL — ABNORMAL HIGH (ref 4.0–10.5)
nRBC: 0 % (ref 0.0–0.2)

## 2021-12-16 LAB — HEMOGLOBIN AND HEMATOCRIT, BLOOD
HCT: 27.7 % — ABNORMAL LOW (ref 36.0–46.0)
Hemoglobin: 9.1 g/dL — ABNORMAL LOW (ref 12.0–15.0)

## 2021-12-16 LAB — HEMOGLOBIN A1C
Hgb A1c MFr Bld: 8.5 % — ABNORMAL HIGH (ref 4.8–5.6)
Mean Plasma Glucose: 197 mg/dL

## 2021-12-16 MED ORDER — INSULIN REGULAR(HUMAN) IN NACL 100-0.9 UT/100ML-% IV SOLN: INTRAVENOUS | Status: DC

## 2021-12-16 MED ORDER — INSULIN GLARGINE-YFGN 100 UNIT/ML ~~LOC~~ SOLN
40.0000 [IU] | Freq: Every day | SUBCUTANEOUS | Status: DC
  Administered 2021-12-16 – 2021-12-20 (×5): 40 [IU] via SUBCUTANEOUS
  Filled 2021-12-16 (×6): qty 0.4

## 2021-12-16 MED ORDER — SODIUM CHLORIDE 0.9 % IV BOLUS
500.0000 mL | Freq: Once | INTRAVENOUS | Status: AC
  Administered 2021-12-16: 500 mL via INTRAVENOUS

## 2021-12-16 MED ORDER — INSULIN ASPART 100 UNIT/ML IJ SOLN: 0.0000 [IU] | Freq: Every day | INTRAMUSCULAR | Status: DC

## 2021-12-16 MED ORDER — INSULIN ASPART 100 UNIT/ML IJ SOLN
0.0000 [IU] | Freq: Three times a day (TID) | INTRAMUSCULAR | Status: DC
  Administered 2021-12-17: 8 [IU] via SUBCUTANEOUS
  Administered 2021-12-17: 3 [IU] via SUBCUTANEOUS
  Administered 2021-12-17: 5 [IU] via SUBCUTANEOUS
  Administered 2021-12-18: 2 [IU] via SUBCUTANEOUS
  Administered 2021-12-18: 3 [IU] via SUBCUTANEOUS
  Administered 2021-12-19 – 2021-12-20 (×4): 2 [IU] via SUBCUTANEOUS

## 2021-12-16 MED ORDER — DEXTROSE 50 % IV SOLN: 0.0000 mL | INTRAVENOUS | Status: DC | PRN

## 2021-12-16 MED ORDER — SODIUM CHLORIDE 0.9% IV SOLUTION: Freq: Once | INTRAVENOUS | Status: AC

## 2021-12-16 MED ORDER — METOPROLOL TARTRATE 12.5 MG HALF TABLET
12.5000 mg | ORAL_TABLET | Freq: Two times a day (BID) | ORAL | Status: DC
  Administered 2021-12-16 – 2021-12-17 (×2): 12.5 mg via ORAL
  Filled 2021-12-16 (×2): qty 1

## 2021-12-16 MED ORDER — INSULIN ASPART 100 UNIT/ML IJ SOLN
5.0000 [IU] | Freq: Three times a day (TID) | INTRAMUSCULAR | Status: DC
  Administered 2021-12-17 – 2021-12-21 (×9): 5 [IU] via SUBCUTANEOUS

## 2021-12-16 NOTE — Progress Notes (Signed)
Physical Therapy Treatment Patient Details Name: Kelly Lee MRN: [id number] DOB: 03-Dec-1950 Today's Date: 12/16/2021   History of Present Illness 71 y.o. female presenting to ED 2/1 with RLE acute critical limb ischemia s/p emergent RLE arterial thrombectomy and R calf four compartment fasciotomy 12/15/21. Admission complicated by post-op hypotension with SPB in 90's. Transferred to ICU 2/2 secondary to hypotension. PMHx significant for DMII, HTN, paroxysmal A-fib, diastolic HF and fall with L hip fx 11/2019.    PT Comments    Pt able to progress to transferring bed > recliner via lateral scooting between the surfaces, continuing to avoid placing R foot on ground and declining to attempt to stand due to anticipation of pain. Of note, pt also reporting infection in L foot that was drained earlier today. Pt requiring minA to scoot bed > recliner, displaying difficulty clearing buttocks to scoot. Will continue to follow acutely. Current recommendations remain appropriate.    Recommendations for follow up therapy are one component of a multi-disciplinary discharge planning process, led by the attending physician.  Recommendations may be updated based on patient status, additional functional criteria and insurance authorization.  Follow Up Recommendations  Skilled nursing-short term rehab (<3 hours/day)     Assistance Recommended at Discharge Intermittent Supervision/Assistance  Patient can return home with the following A lot of help with walking and/or transfers;Two people to help with walking and/or transfers;A lot of help with bathing/dressing/bathroom;Two people to help with bathing/dressing/bathroom;Assistance with cooking/housework;Assist for transportation;Help with stairs or ramp for entrance   Equipment Recommendations  None recommended by PT    Recommendations for Other Services       Precautions / Restrictions Precautions Precautions: Fall Precaution Comments: Hx of falls;  monitor BP; wound vac; A-line Restrictions Weight Bearing Restrictions: No RLE Weight Bearing: Weight bearing as tolerated     Mobility  Bed Mobility Overal bed mobility: Needs Assistance Bed Mobility: Supine to Sit     Supine to sit: Min assist, HOB elevated     General bed mobility comments: Pt requesting assistance to pull up to sit EOB, HOB elevated. Extra time to scoot to EOB.    Transfers Overall transfer level: Needs assistance Equipment used: None Transfers: Bed to chair/wheelchair/BSC            Lateral/Scoot Transfers: Min assist, From elevated surface General transfer comment: Pt elevating R foot off ground despite cues to put on ground due to anticipation of pain. Pt declined attempts to stand to pivot. Dropped arm of recliner and cued pt to scoot laterally to L bed > recliner, minA to complete and prevent pt from scooting too far anteriorly.    Ambulation/Gait               General Gait Details: deferred   Stairs             Wheelchair Mobility    Modified Rankin (Stroke Patients Only)       Balance Overall balance assessment: Needs assistance Sitting-balance support: No upper extremity supported (LLE supported on ground; maintains RLE off the ground in anticipation of pain.) Sitting balance-Leahy Scale: Good         Standing balance comment: Declined standing in anticipation of pain                            Cognition Arousal/Alertness: Awake/alert Behavior During Therapy: Anxious Overall Cognitive Status: Within Functional Limits for tasks assessed  General Comments: Pt self-limiting in placing weight on R foot and attempting to stand due to anticipation of pain. Self-distracts from completing transfers at EOB often, needing residrecting.        Exercises      General Comments General comments (skin integrity, edema, etc.): VSS on RA      Pertinent Vitals/Pain  Pain Assessment Pain Assessment: Faces Faces Pain Scale: Hurts little more Pain Location: R groin and RLE (surgical) Pain Descriptors / Indicators: Throbbing, Tender, Operative site guarding Pain Intervention(s): Limited activity within patient's tolerance, Monitored during session, Repositioned    Home Living                          Prior Function            PT Goals (current goals can now be found in the care plan section) Acute Rehab PT Goals Patient Stated Goal: to get better PT Goal Formulation: With patient Time For Goal Achievement: 12/29/21 Potential to Achieve Goals: Good Progress towards PT goals: Progressing toward goals    Frequency    Min 3X/week      PT Plan Current plan remains appropriate    Co-evaluation              AM-PAC PT "6 Clicks" Mobility   Outcome Measure  Help needed turning from your back to your side while in a flat bed without using bedrails?: A Little Help needed moving from lying on your back to sitting on the side of a flat bed without using bedrails?: A Little Help needed moving to and from a bed to a chair (including a wheelchair)?: A Little Help needed standing up from a chair using your arms (e.g., wheelchair or bedside chair)?: Total Help needed to walk in hospital room?: Total Help needed climbing 3-5 steps with a railing? : Total 6 Click Score: 12    End of Session   Activity Tolerance: Patient limited by pain Patient left: with call bell/phone within reach;in chair;with chair alarm set Nurse Communication: Mobility status PT Visit Diagnosis: History of falling (Z91.81);Muscle weakness (generalized) (M62.81);Difficulty in walking, not elsewhere classified (R26.2);Pain Pain - Right/Left: Right Pain - part of body: Leg     Time: 3009-2330 PT Time Calculation (min) (ACUTE ONLY): 27 min  Charges:  $Therapeutic Activity: 23-37 mins                     Raymond Gurney, PT, DPT Acute Rehabilitation  Services  Pager: [phone] Office: [phone]    Jewel Baize 12/16/2021, 4:24 PM

## 2021-12-16 NOTE — Plan of Care (Signed)

## 2021-12-16 NOTE — Progress Notes (Addendum)
[facility]   Subjective  - Patient sitting upright in bed eating breakfast A & O x3.  Assessment/Planning: POD # 2 71 y.o. female is s/p:  1) right lower extremity arterial thrombectomy 2) right calf four compartment fasciotomy   Right LE well perfused with palpable pedal pulse and motor intact Hypotension HGB 8.1 after 1 unit PRBC.  Will give 500 cc bolus and transfuse addition 1 unti PRBC.  Will order H/H post second unit of PRBC.  She has a history of hypotension with MAPs in the 60-80's Op note has EBL @ 50 CC No evidence of frank blood loss right groin or lower leg incision. Cardiomyopathy with ECHO 2/2 EF normal Afib currently on Heparin full dose She takes Xnaflex TID will hold for now.   Plan maintain MAP> 50 with systolic > 80.  Objective (!) 83/40 87 98.3 F (36.8 C) (Oral) 17 93%  Intake/Output Summary (Last 24 hours) at 12/16/2021 0728 Last data filed at 12/16/2021 0600 Gross per 24 hour  Intake 767 ml  Output 1850 ml  Net -1083 ml    Palpable DP, brisk doppler DP/PT/Peroneal Right groin soft with incisional vac in place, fasciotomy sites healing well with minimal bloody drainage from medial incision.  Compartment soft, motor of ankle and toes intact. Heart irregularly irregular baseline Lungs no labored breathing no acute distress     Mosetta Pigeon 12/16/2021 7:28 AM --  Laboratory Lab Results: Recent Labs    12/15/21 1849 12/16/21 0408  WBC 11.5* 11.6*  HGB 8.1* 8.5*  HCT 24.6* 25.3*  PLT 239 235   BMET Recent Labs    12/15/21 0555 12/16/21 0408  NA 135 133*  K 3.9 3.6  CL 100 100  CO2 26 25  GLUCOSE 192* 222*  BUN 12 15  CREATININE 0.87 1.04*  CALCIUM 7.8* 8.0*    COAG No results found for: INR, PROTIME No results found for: PTT  VASCULAR STAFF ADDENDUM: I have independently interviewed and examined the patient. I agree with the above.  Marginal blood pressure. Hemoglobin  stable. Mobilize as able.  Likely out of unit tomorrow if hemodynamics remain stable.  Rande Brunt. Lenell Antu, MD [facility] Phone Number: [phone] 12/16/2021 1:31 PM

## 2021-12-16 NOTE — Progress Notes (Signed)
ANTICOAGULATION CONSULT NOTE   Pharmacy Consult for heparin Indication:  Acute arterial occlusion  Allergies  Allergen Reactions   Penicillins Hives and Shortness Of Breath   Azithromycin Hives   Drug Ingredient [Cephalexin] Hives    Patient Measurements: Height: 5\' 1"  (154.9 cm) Weight: 88.7 kg (195 lb 8.8 oz) IBW/kg (Calculated) : 47.8 Heparin Dosing Weight: 86 kg   Vital Signs: Temp: 97.8 F (36.6 C) (02/03 0914) Temp Source: Oral (02/03 0914) BP: 80/36 (02/03 0859) Pulse Rate: 72 (02/03 0914)  Labs: Recent Labs    12/14/21 2004 12/15/21 0555 12/15/21 0958 12/15/21 1254 12/15/21 1849 12/15/21 2317 12/16/21 0408 12/16/21 0909  HGB 10.9* 8.8* 8.1*  --  8.1*  --  8.5*  --   HCT 32.0* 28.1* 25.3*  --  24.6*  --  25.3*  --   PLT  --  263 249  --  239  --  235  --   HEPARINUNFRC  --   --   --   --   --  <0.10*  --  0.55  CREATININE 1.00 0.87  --   --   --   --  1.04*  --   TROPONINIHS  --   --   --  15  --   --   --   --      Estimated Creatinine Clearance: 50.3 mL/min (A) (by C-G formula based on SCr of 1.04 mg/dL (H)).   Assessment: 39 YOF with pain in her R leg and loss of pulses in extremity concerning for acute arterial occlusion. She is now s/p thrombectomy/fasciotomy on 2/1 and heparin held this morning due to bleeding from the  fasciotomy incisions -heparin on hold until 3pm -Hg= 8.1 > 8.5, getting PRBCs today, platelet count OK.  No overt bleeding noted.  Heparin level this morning is within goal range.    Goal of Therapy:  Heparin level 0.3-0.7 units/ml Monitor platelets by anticoagulation protocol: Yes   Plan:  -Continue IV heparin at current rate of 1450 units/hr. -Check heparin level in 8 hrs -Daily heparin level and CBC. -F/u plans for oral anticoagulation eventually.   [place] Crane, Roylene Reason, [facility] Clinical Pharmacist  12/16/2021 10:44 AM   [facility] pharmacy phone numbers are listed on [email or web address]

## 2021-12-16 NOTE — Progress Notes (Signed)
ANTICOAGULATION CONSULT NOTE   Pharmacy Consult for heparin Indication:  Acute arterial occlusion  Allergies  Allergen Reactions   Penicillins Hives and Shortness Of Breath   Azithromycin Hives   Drug Ingredient [Cephalexin] Hives    Patient Measurements: Height: 5\' 1"  (154.9 cm) Weight: 88.7 kg (195 lb 8.8 oz) IBW/kg (Calculated) : 47.8 Heparin Dosing Weight: 86 kg   Vital Signs: Temp: 97.9 F (36.6 C) (02/03 1542) Temp Source: Oral (02/03 1542) BP: 80/36 (02/03 0859) Pulse Rate: 106 (02/03 1200)  Labs: Recent Labs    12/14/21 2004 12/15/21 0555 12/15/21 0958 12/15/21 1254 12/15/21 1849 12/15/21 2317 12/16/21 0408 12/16/21 0909 12/16/21 1434  HGB 10.9* 8.8* 8.1*  --  8.1*  --  8.5*  --  9.1*  HCT 32.0* 28.1* 25.3*  --  24.6*  --  25.3*  --  27.7*  PLT  --  263 249  --  239  --  235  --   --   HEPARINUNFRC  --   --   --   --   --  <0.10*  --  0.55 0.78*  CREATININE 1.00 0.87  --   --   --   --  1.04*  --   --   TROPONINIHS  --   --   --  15  --   --   --   --   --      Estimated Creatinine Clearance: 50.3 mL/min (A) (by C-G formula based on SCr of 1.04 mg/dL (H)).   Assessment: 40 YOF with pain in her R leg and loss of pulses in extremity concerning for acute arterial occlusion. She is now s/p thrombectomy/fasciotomy on 2/1 and heparin previously held due to bleeding from the  fasciotomy incisions  Heparin level 0.78 (on heparin 1450 units/hr) H/H 9.1, 27.2. No signs/symptoms of bleed  Goal of Therapy:  Heparin level 0.3-0.7 units/ml Monitor platelets by anticoagulation protocol: Yes   Plan:  -Will decrease heparin slightly to 1400 units/hr -Check heparin level in 8 hrs -Daily heparin level and CBC. -F/u plans for oral anticoagulation eventually.  Thank you for allowing pharmacy to be a part of this patients care.  Donnald Garre, PharmD Clinical Pharmacist  Please check AMION for all [place] numbers After 10:00 PM, call [place]  [phone]

## 2021-12-16 NOTE — Progress Notes (Signed)
Physical Therapy Treatment Patient Details Name: Kelly Lee MRN: [id number] DOB: Sep 12, 1951 Today's Date: 12/16/2021   History of Present Illness 71 y.o. female presenting to ED 2/1 with RLE acute critical limb ischemia s/p emergent RLE arterial thrombectomy and R calf four compartment fasciotomy 12/15/21. Admission complicated by post-op hypotension with SPB in 90's. Transferred to ICU 2/2 secondary to hypotension. PMHx significant for DMII, HTN, paroxysmal A-fib, diastolic HF and fall with L hip fx 11/2019.    PT Comments    RN messaged this PT requesting assistance to transfer pt back to bed after pt sat up in chair for ~1 hr. Pt was having difficulty scooting laterally from chair to EOB, which was slightly higher. Using the bed pad to control pt's hips, was able to squat pivot to her L chair > bed with maxAx2. Pt limited in lower extremity strength and ability to withstand weight bearing bil due to pain. Will continue to follow acutely. Current recommendations remain appropriate.    Recommendations for follow up therapy are one component of a multi-disciplinary discharge planning process, led by the attending physician.  Recommendations may be updated based on patient status, additional functional criteria and insurance authorization.  Follow Up Recommendations  Skilled nursing-short term rehab (<3 hours/day)     Assistance Recommended at Discharge Intermittent Supervision/Assistance  Patient can return home with the following A lot of help with walking and/or transfers;Two people to help with walking and/or transfers;A lot of help with bathing/dressing/bathroom;Two people to help with bathing/dressing/bathroom;Assistance with cooking/housework;Assist for transportation;Help with stairs or ramp for entrance   Equipment Recommendations  None recommended by PT    Recommendations for Other Services       Precautions / Restrictions Precautions Precautions: Fall Precaution Comments: Hx  of falls; monitor BP; wound vac; A-line Restrictions Weight Bearing Restrictions: No RLE Weight Bearing: Weight bearing as tolerated     Mobility  Bed Mobility Overal bed mobility: Needs Assistance Bed Mobility: Sit to Supine     Supine to sit: Min assist, HOB elevated Sit to supine: Min assist   General bed mobility comments: MinA to lift legs back to supine.    Transfers Overall transfer level: Needs assistance Equipment used: None Transfers: Bed to chair/wheelchair/BSC            Lateral/Scoot Transfers: Max assist, +2 physical assistance, +2 safety/equipment General transfer comment: RN requesting PT to return and assist with transfer of pt back to bed as was unable to due to bed being higher than chair. Arm rest dropped and used bed pad to control hips with L knee block and cues for pt to hold onto PT anterior to her to squat pivot to L chair > bed, maxAx2.    Ambulation/Gait               General Gait Details: deferred   Stairs             Wheelchair Mobility    Modified Rankin (Stroke Patients Only)       Balance Overall balance assessment: Needs assistance Sitting-balance support: No upper extremity supported (LLE supported on ground; maintains RLE off the ground in anticipation of pain.) Sitting balance-Leahy Scale: Good         Standing balance comment: Declined standing in anticipation of pain                            Cognition Arousal/Alertness: Awake/alert Behavior During Therapy: Anxious Overall Cognitive Status:  Within Functional Limits for tasks assessed                                 General Comments: Pt self-limiting in placing weight on R foot and attempting to stand due to anticipation of pain. Emotional in regards to being unable to scoot back to bed without increased assistance.        Exercises      General Comments General comments (skin integrity, edema, etc.): VSS on RA       Pertinent Vitals/Pain Pain Assessment Pain Assessment: Faces Faces Pain Scale: Hurts even more Pain Location: R groin and RLE (surgical) Pain Descriptors / Indicators: Throbbing, Tender, Operative site guarding, Crying Pain Intervention(s): Limited activity within patient's tolerance, Monitored during session, Repositioned    Home Living                          Prior Function            PT Goals (current goals can now be found in the care plan section) Acute Rehab PT Goals Patient Stated Goal: to get better PT Goal Formulation: With patient Time For Goal Achievement: 12/29/21 Potential to Achieve Goals: Good Progress towards PT goals: Progressing toward goals    Frequency    Min 3X/week      PT Plan Current plan remains appropriate    Co-evaluation              AM-PAC PT "6 Clicks" Mobility   Outcome Measure  Help needed turning from your back to your side while in a flat bed without using bedrails?: A Little Help needed moving from lying on your back to sitting on the side of a flat bed without using bedrails?: A Little Help needed moving to and from a bed to a chair (including a wheelchair)?: Total Help needed standing up from a chair using your arms (e.g., wheelchair or bedside chair)?: Total Help needed to walk in hospital room?: Total Help needed climbing 3-5 steps with a railing? : Total 6 Click Score: 10    End of Session   Activity Tolerance: Patient limited by pain Patient left: with call bell/phone within reach;in bed;with bed alarm set;with nursing/sitter in room Nurse Communication: Mobility status PT Visit Diagnosis: History of falling (Z91.81);Muscle weakness (generalized) (M62.81);Difficulty in walking, not elsewhere classified (R26.2);Pain Pain - Right/Left: Right Pain - part of body: Leg     Time: [id number] PT Time Calculation (min) (ACUTE ONLY): 8 min  Charges:  $Therapeutic Activity: 8-22 mins                      Moishe Spice, PT, DPT Acute Rehabilitation Services  Pager: [phone] Office: [place] 12/16/2021, 6:01 PM

## 2021-12-16 NOTE — Progress Notes (Signed)
Progress Note   Patient: Kelly Lee [id number] DOB: 12/20/1950 DOA: 12/14/2021     1 DOS: the patient was seen and examined on 12/16/2021   Brief hospital course: 71 y/o female admitted to the hospital with acute right leg ischemia secondary to arterial thrombus. She was admitted to the hospital under vascular surgery and underwent thrombectomy and 4 compartment fasciotomy. Post operatively, she developed hypotension and anemia requiring transfer to [facility] for closer monitoring. [facility] hospitalists asked to follow patient for medical management  Assessment and Plan: * Critical lower limb ischemia ([place])- (present on admission) Admitted to vascular surgery service. S/p right lower leg arterial thrombectomy and right four compartment fasciotomy. Currently on IV heparin. Management per vascular surgery service.  Cardiomyopathy ([place]) Noted to have EF of 35-40% on echo from 12/01/2020 Echo repeated on 2/2 and EF noted to be normal Appears to be on GDMT Resume therapies as blood pressures will allow  Hypotension Noted to be hypotensive 2/2 and transferred to [facility] for closer monitoring troponins normal, Echo with normal EF Possibly related to anemia and blood loss Hold antihypertensives Overall blood pressures improving  Anemia- (present on admission) Hemoglobin 11.6 on admission Trended down to 8.1 Possibly hemodilution +/- blood loss during surgery She has been transfused a total of 2 unit prbc thus far Continue to follow hemoglobin  Type 2 diabetes mellitus, with long-term current use of insulin ([place]) She takes Lantus, metformin and jardiance as outpatient Continued on lantus and SSI since admission Resume outpatient regimen on discharge Currently hyperglycemic, will increase lantus to 40 units QHS and add meal coverage novolog  PAF (paroxysmal atrial fibrillation) ([place])- (present on admission) Chronically on metoprolol 200 mg daily Noted to be sinus bradycardia on admission Dose of  metoprolol confirmed with patient's daughter Her CHA2DS2-VASc score of 8 Previous DC summary from [facility] in 11/2020 indicated that she was discharged with eliquis Patient reports that at some point this was discontinued Her daughter is unaware of any contraindication to anticoagulation Currently she is on IV heparin. Would recommend resuming anticoagulation with eliquis prior to discharge Her BP is improved into 150s today and HR ranging between 90-100 now. 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 She has not had any chest pain Cardiac enzymes checked and noted to be negative Echocardiogram shows normal ejection fraction  Chronic respiratory failure with hypoxia ([place])- (present on admission) Appears to chronically be on 2L Resp status currently stable  Hypertension- (present on admission) She is on multiple antihypertensives at home including Toprol, Lasix, Aldactone  These were continued on admission, but patient became hypotensive so they were placed on hold BP is now trending up, currently 150-160s most of afternoon Will restart low dose metoprolol        Subjective: she has some pain in her leg, no chest pain or shortness of breath  Physical Exam: Vitals:   12/16/21 1900 12/16/21 1930 12/16/21 2012 12/16/21 2100  BP:  133/82 (!) 120/56 132/60  Pulse:   93   Resp: 15 (!) 22 19 (!) 26  Temp:      TempSrc:      SpO2: 95% 96% 96% 94%  Weight:      Height:  General exam: Alert, awake, oriented x 3 Respiratory system: Clear to auscultation. Respiratory effort normal. Cardiovascular system:irregular. No murmurs,  rubs, gallops. Gastrointestinal system: Abdomen is nondistended, soft and nontender. No organomegaly or masses felt. Normal bowel sounds heard. Central nervous system: Alert and oriented. No focal neurological deficits. Extremities: No C/C/E, +pedal pulses Skin: No rashes, lesions or ulcers Psychiatry: Judgement and insight appear normal. Mood & affect appropriate.    Data Reviewed:  CBC and blood sugars were reviewed  Family Communication: no family present today  Disposition: Status is: Inpatient Remains inpatient appropriate because: further management of hypotension and post op care           Planned Discharge Destination: Skilled nursing facility     Time spent: 35 minutes  Author: Kathie Dike, MD 12/16/2021 9:22 PM  For on call review [email or web address].

## 2021-12-16 NOTE — Plan of Care (Signed)
  Problem: Education: Goal: Knowledge of General Education information will improve Description: Including pain rating scale, medication(s)/side effects and non-pharmacologic comfort measures Outcome: Progressing   Problem: Health Behavior/Discharge Planning: Goal: Ability to manage health-related needs will improve Outcome: Progressing   Problem: Clinical Measurements: Goal: Ability to maintain clinical measurements within normal limits will improve Outcome: Progressing Goal: Will remain free from infection Outcome: Progressing Goal: Diagnostic test results will improve Outcome: Progressing Goal: Respiratory complications will improve Outcome: Progressing Goal: Cardiovascular complication will be avoided Outcome: Progressing   Problem: Activity: Goal: Risk for activity intolerance will decrease Outcome: Progressing   Problem: Nutrition: Goal: Adequate nutrition will be maintained Outcome: Progressing   Problem: Coping: Goal: Level of anxiety will decrease Outcome: Progressing   Problem: Elimination: Goal: Will not experience complications related to urinary retention Outcome: Progressing   Problem: Pain Managment: Goal: General experience of comfort will improve Outcome: Progressing   Problem: Safety: Goal: Ability to remain free from injury will improve Outcome: Progressing   

## 2021-12-16 NOTE — Progress Notes (Signed)
ANTICOAGULATION CONSULT NOTE   Pharmacy Consult for heparin Indication:  Acute arterial occlusion  Allergies  Allergen Reactions   Penicillins Hives and Shortness Of Breath   Azithromycin Hives   Drug Ingredient [Cephalexin] Hives    Patient Measurements: Height: 5\' 1"  (154.9 cm) Weight: 84.9 kg (187 lb 2.7 oz) IBW/kg (Calculated) : 47.8 Heparin Dosing Weight: 86 kg   Vital Signs: Temp: 98.3 F (36.8 C) (02/02 2320) Temp Source: Oral (02/02 2320) BP: 104/34 (02/03 0000) Pulse Rate: 80 (02/03 0000)  Labs: Recent Labs    12/14/21 1920 12/14/21 2004 12/15/21 0555 12/15/21 0958 12/15/21 1254 12/15/21 1849 12/15/21 2317  HGB 11.6* 10.9* 8.8* 8.1*  --  8.1*  --   HCT 36.9 32.0* 28.1* 25.3*  --  24.6*  --   PLT 310  --  263 249  --  239  --   HEPARINUNFRC  --   --   --   --   --   --  <0.10*  CREATININE 1.08* 1.00 0.87  --   --   --   --   TROPONINIHS  --   --   --   --  15  --   --      Estimated Creatinine Clearance: 58.6 mL/min (by C-G formula based on SCr of 0.87 mg/dL).   Assessment: 77 YOF with pain in her R leg and loss of pulses in extremity concerning for acute arterial occlusion. She is now s/p thrombectomy/fasciotomy on 2/1 and heparin held this morning due to bleeding from the  fasciotomy incisions -heparin on hold until 3pm -Hg= 8.8 w/ trend down  Heparin level low after restart  Goal of Therapy:  Heparin level 0.3-0.7 units/ml Monitor platelets by anticoagulation protocol: Yes   Plan:  -Increase heparin to 1450 units / hr -Check heparin level in 8 hrs  Thank you 62, PharmD **Pharmacist phone directory can now be found on [email or web address] ([email or web address]).  Listed under [facility].

## 2021-12-16 NOTE — Progress Notes (Signed)
Initiated Phase 2 ICU Glycemic Control. CBG at 1152 was 222. CBG at 1541 was 203. CBG at 1753 was 229.

## 2021-12-16 NOTE — TOC Progression Note (Signed)
Transition of Care [facility]) - Progression Note    Patient Details  Name: Anaka Beazer MRN: [id number] Date of Birth: [birth year]  [facility] CM/SW Contact  Delilah Shan, LCSWA Phone Number: 12/16/2021, 12:17 PM  Clinical Narrative:     CSW spoke with patient at bedside and provided medicare compare ratings list with accepted SNF bed offers. Patient would like for CSW to check and see if [facility] can offer. Patient would like to review list of accepted bed offers and speak with her daughter to help make SNF choice. CSW will check with [facility] to see if they can offer SNF bed for patient and will follow back up with patient on SNF choice.CSW will continue to follow and assist with patients dc planning needs   Expected Discharge Plan: Skilled Nursing Facility Barriers to Discharge: Continued Medical Work up  Expected Discharge Plan and Services Expected Discharge Plan: Skilled Nursing Facility In-house Referral: Clinical Social Work     Living arrangements for the past 2 months: Single Family Home                                       Social Determinants of Health (SDOH) Interventions    Readmission Risk Interventions Readmission Risk Prevention Plan 12/12/2019  Transportation Screening Complete  PCP or Specialist Appt within 5-7 Days Not Complete  Not Complete comments plan for SNF  Home Care Screening Complete  Medication Review (RN CM) Referral to Pharmacy  Some recent data might be hidden

## 2021-12-17 DIAGNOSIS — D649 Anemia, unspecified: Secondary | ICD-10-CM | POA: Diagnosis not present

## 2021-12-17 DIAGNOSIS — I429 Cardiomyopathy, unspecified: Secondary | ICD-10-CM | POA: Diagnosis not present

## 2021-12-17 DIAGNOSIS — I70209 Unspecified atherosclerosis of native arteries of extremities, unspecified extremity: Secondary | ICD-10-CM | POA: Diagnosis not present

## 2021-12-17 DIAGNOSIS — I70229 Atherosclerosis of native arteries of extremities with rest pain, unspecified extremity: Secondary | ICD-10-CM | POA: Diagnosis not present

## 2021-12-17 LAB — BPAM RBC
Blood Product Expiration Date: 202302062359
Blood Product Expiration Date: 202303032359
ISSUE DATE / TIME: 202302021303
ISSUE DATE / TIME: 202302030828
Unit Type and Rh: 5100
Unit Type and Rh: 5100

## 2021-12-17 LAB — BASIC METABOLIC PANEL
Anion gap: 8 (ref 5–15)
BUN: 12 mg/dL (ref 8–23)
CO2: 25 mmol/L (ref 22–32)
Calcium: 8.1 mg/dL — ABNORMAL LOW (ref 8.9–10.3)
Chloride: 100 mmol/L (ref 98–111)
Creatinine, Ser: 0.99 mg/dL (ref 0.44–1.00)
GFR, Estimated: >60 mL/min (ref >60)
Glucose, Bld: 188 mg/dL — ABNORMAL HIGH (ref 70–99)
Potassium: 3.7 mmol/L (ref 3.5–5.1)
Sodium: 133 mmol/L — ABNORMAL LOW (ref 135–145)

## 2021-12-17 LAB — TYPE AND SCREEN
ABO/RH(D): O POS
Antibody Screen: NEGATIVE
Unit division: 0
Unit division: 0

## 2021-12-17 LAB — CBC
HCT: 27.8 % — ABNORMAL LOW (ref 36.0–46.0)
Hemoglobin: 9.2 g/dL — ABNORMAL LOW (ref 12.0–15.0)
MCH: 29.5 pg (ref 26.0–34.0)
MCHC: 33.1 g/dL (ref 30.0–36.0)
MCV: 89.1 fL (ref 80.0–100.0)
Platelets: 231 10*3/uL (ref 150–400)
RBC: 3.12 MIL/uL — ABNORMAL LOW (ref 3.87–5.11)
RDW: 15.7 % — ABNORMAL HIGH (ref 11.5–15.5)
WBC: 11.2 10*3/uL — ABNORMAL HIGH (ref 4.0–10.5)
nRBC: 0 % (ref 0.0–0.2)

## 2021-12-17 LAB — GLUCOSE, CAPILLARY
Glucose-Capillary: 210 mg/dL — ABNORMAL HIGH (ref 70–99)
Glucose-Capillary: 161 mg/dL — ABNORMAL HIGH (ref 70–99)
Glucose-Capillary: 251 mg/dL — ABNORMAL HIGH (ref 70–99)
Glucose-Capillary: 172 mg/dL — ABNORMAL HIGH (ref 70–99)

## 2021-12-17 LAB — HEPARIN LEVEL (UNFRACTIONATED): Heparin Unfractionated: 0.56 IU/mL (ref 0.30–0.70)

## 2021-12-17 LAB — MAGNESIUM: Magnesium: 1.7 mg/dL (ref 1.7–2.4)

## 2021-12-17 MED ORDER — SPIRONOLACTONE 12.5 MG HALF TABLET
12.5000 mg | ORAL_TABLET | Freq: Every day | ORAL | Status: DC
  Administered 2021-12-17 – 2021-12-21 (×5): 12.5 mg via ORAL
  Filled 2021-12-17 (×5): qty 1

## 2021-12-17 MED ORDER — METOPROLOL TARTRATE 12.5 MG HALF TABLET
12.5000 mg | ORAL_TABLET | ORAL | Status: AC
  Administered 2021-12-17: 12.5 mg via ORAL
  Filled 2021-12-17: qty 1

## 2021-12-17 MED ORDER — POTASSIUM CHLORIDE CRYS ER 20 MEQ PO TBCR: 40.0000 meq | EXTENDED_RELEASE_TABLET | Freq: Once | ORAL | Status: DC

## 2021-12-17 MED ORDER — FUROSEMIDE 20 MG PO TABS
20.0000 mg | ORAL_TABLET | Freq: Every day | ORAL | Status: DC
  Administered 2021-12-17 – 2021-12-21 (×5): 20 mg via ORAL
  Filled 2021-12-17 (×5): qty 1

## 2021-12-17 MED ORDER — POTASSIUM CHLORIDE CRYS ER 20 MEQ PO TBCR
20.0000 meq | EXTENDED_RELEASE_TABLET | Freq: Once | ORAL | Status: AC
  Administered 2021-12-17: 20 meq via ORAL
  Filled 2021-12-17: qty 1

## 2021-12-17 MED ORDER — METOPROLOL TARTRATE 25 MG PO TABS: 25.0000 mg | ORAL_TABLET | Freq: Two times a day (BID) | ORAL | Status: DC

## 2021-12-17 MED ORDER — MAGNESIUM SULFATE 2 GM/50ML IV SOLN: 2.0000 g | Freq: Once | INTRAVENOUS | Status: DC

## 2021-12-17 MED ORDER — METOPROLOL TARTRATE 12.5 MG HALF TABLET
12.5000 mg | ORAL_TABLET | Freq: Two times a day (BID) | ORAL | Status: DC
  Administered 2021-12-17 – 2021-12-18 (×2): 12.5 mg via ORAL
  Filled 2021-12-17 (×2): qty 1

## 2021-12-17 NOTE — Consult Note (Signed)
WOC Nurse Consult Note: Consult received for left lateral malleolus ulceration.  Dr. Trula Slade saw this morning and recommended a dry dressing for now.Vascular is following closely to monitor right LE.  No role for WOC Nursing at this time.  [place] nursing team will not follow, but will remain available to this patient, the nursing and medical teams.  Please re-consult if needed. Thanks, Maudie Flakes, MSN, RN, [place], Arther Abbott  Pager# [phone]

## 2021-12-17 NOTE — Progress Notes (Signed)
° ° °  Subjective  - POD #3, s/p right LE thrombectomy and fasciotomy  Required labetalol for HTN overnight Says she doesn't feel well   Physical Exam:  Palpable bilateral DP Fullness in lateral fasciotomy Left lateral ankle ulcer          Assessment/Plan:  POD #3  CV:  BP stable, no further issues.  On heparin drip for h/o afib Heme:  Hb stable after transfusion, repeat cbc tomorrow Pulm:  no issues GI:  diet as tolerated Renal:  creatinine normal, good UOP ID:  no issues Diabetes:  Glc remain elevated, will ask diabetes coordinator to evaluate Prophylaxis: heparin drip and protonix Monitor right leg fasciotomy, fullness in anterior and lateral compartments Left ankle ulcer:  dry dressing for now Dispo: transfer to 4 east, d/c a-line  [place] Krystan Northrop 12/17/2021 7:42 AM --  Vitals:   12/17/21 0710 12/17/21 0734  BP: (!) 155/90   Pulse:    Resp: 18   Temp:  98.4 F (36.9 C)  SpO2: 99%     Intake/Output Summary (Last 24 hours) at 12/17/2021 0742 Last data filed at 12/17/2021 0700 Gross per 24 hour  Intake 1092.57 ml  Output 2800 ml  Net -1707.43 ml     Laboratory CBC    Component Value Date/Time   WBC 11.2 (H) 12/17/2021 0125   HGB 9.2 (L) 12/17/2021 0125   HCT 27.8 (L) 12/17/2021 0125   PLT 231 12/17/2021 0125    BMET    Component Value Date/Time   NA 133 (L) 12/17/2021 0125   K 3.7 12/17/2021 0125   CL 100 12/17/2021 0125   CO2 25 12/17/2021 0125   GLUCOSE 188 (H) 12/17/2021 0125   BUN 12 12/17/2021 0125   CREATININE 0.99 12/17/2021 0125   CALCIUM 8.1 (L) 12/17/2021 0125   GFRNONAA >60 12/17/2021 0125   GFRAA >60 12/17/2019 0347    COAG No results found for: INR, PROTIME No results found for: PTT  Antibiotics Anti-infectives (From admission, onward)    Start     Dose/Rate Route Frequency Ordered Stop   12/14/21 2230  clindamycin (CLEOCIN) IVPB 900 mg        900 mg 100 mL/hr over 30 Minutes Intravenous To Surgery 12/14/21 2221  12/14/21 2225        V. Charlena Cross, M.D., [facility] [place] Office: [phone] Pager:  [phone]

## 2021-12-17 NOTE — Progress Notes (Signed)
Pt arrived to 4E25 from 2H.  Telemetry monitor applied and CCMD notified.  CHG bath and skin assessment completed.  Pt oriented to unit and room to include call light and phone.  Call light within reach and all needs addressed.

## 2021-12-17 NOTE — Progress Notes (Signed)
ANTICOAGULATION CONSULT NOTE   Pharmacy Consult for heparin Indication:  Acute arterial occlusion  Allergies  Allergen Reactions   Penicillins Hives and Shortness Of Breath   Azithromycin Hives   Drug Ingredient [Cephalexin] Hives    Patient Measurements: Height: 5\' 1"  (154.9 cm) Weight: 91 kg (200 lb 9.9 oz) IBW/kg (Calculated) : 47.8 Heparin Dosing Weight: 86 kg   Vital Signs: Temp: 98.8 F (37.1 C) (02/03 2012) Temp Source: Oral (02/03 2012) BP: 134/81 (02/04 0630) Pulse Rate: 89 (02/04 0400)  Labs: Recent Labs    12/15/21 0555 12/15/21 0958 12/15/21 1254 12/15/21 1849 12/15/21 2317 12/16/21 0408 12/16/21 0909 12/16/21 1434 12/16/21 2320 12/17/21 0125 12/17/21 0602  HGB 8.8*   < >  --  8.1*  --  8.5*  --  9.1*  --  9.2*  --   HCT 28.1*   < >  --  24.6*  --  25.3*  --  27.7*  --  27.8*  --   PLT 263   < >  --  239  --  235  --   --   --  231  --   HEPARINUNFRC  --   --   --   --    < >  --    < > 0.78* 0.58  --  0.56  CREATININE 0.87  --   --   --   --  1.04*  --   --   --  0.99  --   TROPONINIHS  --   --  15  --   --   --   --   --   --   --   --    < > = values in this interval not displayed.     Estimated Creatinine Clearance: 53.6 mL/min (by C-G formula based on SCr of 0.99 mg/dL).   Assessment: 46 YOF with pain in her R leg and loss of pulses in extremity concerning for acute arterial occlusion. She is now s/p thrombectomy/fasciotomy on 2/1 and heparin held on 2/3 until 1500 due to bleeding from the  fasciotomy incisions.   Heparin level this morning is therapeutic at 0.56 on 1400 units/hr. 2 consecutive therapeutic levels so can move to daily levels. Hemoglobin low, but improving at 9.2 today. Platelets wnl at 231. No issues with bleeding per RN.   Goal of Therapy:  Heparin level 0.3-0.7 units/ml Monitor platelets by anticoagulation protocol: Yes   Plan:  -Continue heparin at 1400 units/hr -Daily heparin level and CBC. -F/u plans for oral  anticoagulation eventually.  Thank you for allowing pharmacy to be a part of this patients care.  Cathrine Muster, PharmD PGY2 Cardiology Pharmacy Resident Phone: [phone] 12/17/2021  7:12 AM  Please check [email or web address] for unit-specific pharmacy phone numbers.

## 2021-12-17 NOTE — Progress Notes (Signed)
Consultation Progress Note   Patient: Kelly Lee [id number]:[id number] DOB: 02-05-1951 DOA: 12/14/2021 DOS: the patient was seen and examined on 12/17/2021 Primary service: Leonie Douglas, MD  Brief hospital course: 71 y/o female admitted to the hospital with acute right leg ischemia secondary to arterial thrombus. She was admitted to the hospital under vascular surgery and underwent thrombectomy and 4 compartment fasciotomy. Post operatively, she developed hypotension and anemia requiring transfer to [facility] for closer monitoring. [facility] hospitalists asked to follow patient for medical management  Assessment and Plan: * Critical lower limb ischemia (HCC)- (present on admission) Admitted to vascular surgery service. S/p right lower leg arterial thrombectomy and right four compartment fasciotomy. Currently on IV heparin. Management per vascular surgery service.  Cardiomyopathy (HCC) Noted to have EF of 35-40% on echo from 12/01/2020 Echo repeated on 2/2 and EF noted to be normal Appears to be on GDMT Resuming metoprolol, lasix and aldactone. She is also on jardiance  Anemia- (present on admission) Hemoglobin 11.6 on admission Trended down to 8.1 Possibly hemodilution +/- blood loss during surgery She has been transfused a total of 2 unit prbc thus far Follow up hemoglobin stable at 9.2 Continue to follow hemoglobin  Type 2 diabetes mellitus, with long-term current use of insulin (HCC) She takes Lantus, metformin and jardiance as outpatient Continued on lantus and SSI since admission Resume outpatient regimen on discharge Adjusting lantus and meal coverage novolog based on blood sugars  PAF (paroxysmal atrial fibrillation) (HCC)- (present on admission) Chronically on metoprolol 200 mg daily Noted to be sinus bradycardia on admission Dose of metoprolol confirmed with patient's daughter Her CHA2DS2-VASc score of 8 Previous DC summary from [facility] in 11/2020 indicated that she was discharged with  eliquis Patient reports that at some point this was discontinued Her daughter is unaware of any contraindication to anticoagulation Currently she is on IV heparin. Would recommend transitioning heparin to eliquis when felt appropriate by vascular Since blood pressure now improved, metoprolol has been restarted  Morbid obesity with BMI of 40.0-44.9, adult (HCC) Chronic  Hyperthyroidism- (present on admission) She is chronically on methimazole which was continued on admission  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 Cardiac enzymes checked and noted to be negative Echocardiogram shows normal ejection fraction Had some atypical chest pain overnight, now resolved   Chronic respiratory failure with hypoxia (HCC)- (present on admission) Appears to chronically be on 2L Resp status currently stable  Hypertension- (present on admission) She is on multiple antihypertensives at home including Toprol, Lasix, Aldactone  These were continued on admission, but patient became hypotensive so they were placed on hold BP is now elevated Metoprolol restarted, titrate up as tolerated  Hypotension-resolved as of 12/17/2021 Noted to be hypotensive on 2/2 and transferred to [facility] for closer monitoring troponins normal, Echo with normal EF Possibly related to anemia and blood loss Hold antihypertensives          TRH will continue to follow the patient.  Subjective: says she did not sleep well last night. Had some left sided chest pain and points to the back of her left ribs. Now resolved.  Physical Exam: Vitals:   12/17/21 0800 12/17/21 0900 12/17/21 1000 12/17/21 1123  BP: (!) 136/50 106/83 (!) 148/70   Pulse:      Resp: 16 15 20    Temp:    98.2 F (36.8 C)  TempSrc:      SpO2: 95% 95% 95%  Weight:      Height:       General exam: Alert, awake, oriented x 3 Respiratory  system: mild crackles at bases. Respiratory effort normal. Cardiovascular system:RRR. No murmurs, rubs, gallops. Gastrointestinal system: Abdomen is nondistended, soft and nontender. No organomegaly or masses felt. Normal bowel sounds heard. Central nervous system: Alert and oriented. No focal neurological deficits. Extremities: No C/C/E, +pedal pulses Skin: No rashes, lesions or ulcers Psychiatry: Judgement and insight appear normal. Mood & affect appropriate.    Data Reviewed:  Reviewed CBC and electrolytes. Repeat chemistry and Mg ordered for AM  Family Communication: none present   Time spent: 35 minutes.  Author: Erick Blinks, MD 12/17/2021 11:43 AM  For on call review [email or web address].

## 2021-12-17 NOTE — Progress Notes (Signed)
ANTICOAGULATION CONSULT NOTE   Pharmacy Consult for heparin Indication:  Acute arterial occlusion  Allergies  Allergen Reactions   Penicillins Hives and Shortness Of Breath   Azithromycin Hives   Drug Ingredient [Cephalexin] Hives    Patient Measurements: Height: 5\' 1"  (154.9 cm) Weight: 88.7 kg (195 lb 8.8 oz) IBW/kg (Calculated) : 47.8 Heparin Dosing Weight: 86 kg   Vital Signs: Temp: 98.8 F (37.1 C) (02/03 2012) Temp Source: Oral (02/03 2012) BP: 128/71 (02/03 2300) Pulse Rate: 93 (02/03 2012)  Labs: Recent Labs    12/14/21 2004 12/15/21 0555 12/15/21 0958 12/15/21 1254 12/15/21 1849 12/15/21 2317 12/16/21 0408 12/16/21 0909 12/16/21 1434 12/16/21 2320  HGB 10.9* 8.8* 8.1*  --  8.1*  --  8.5*  --  9.1*  --   HCT 32.0* 28.1* 25.3*  --  24.6*  --  25.3*  --  27.7*  --   PLT  --  263 249  --  239  --  235  --   --   --   HEPARINUNFRC  --   --   --   --   --    < >  --  0.55 0.78* 0.58  CREATININE 1.00 0.87  --   --   --   --  1.04*  --   --   --   TROPONINIHS  --   --   --  15  --   --   --   --   --   --    < > = values in this interval not displayed.     Estimated Creatinine Clearance: 50.3 mL/min (A) (by C-G formula based on SCr of 1.04 mg/dL (H)).   Assessment: 51 YOF with pain in her R leg and loss of pulses in extremity concerning for acute arterial occlusion. She is now s/p thrombectomy/fasciotomy on 2/1 and heparin previously held due to bleeding from the  fasciotomy incisions  Heparin level 0.58  Goal of Therapy:  Heparin level 0.3-0.7 units/ml Monitor platelets by anticoagulation protocol: Yes   Plan:  -Continue heparin at 1400 units/hr -Daily heparin level and CBC. -F/u plans for oral anticoagulation eventually.  Thank you for allowing pharmacy to be a part of this patients care.  Thank you Anette Guarneri, PharmD  Please check AMION for all [place] numbers After 10:00 PM, call [place] [phone]

## 2021-12-18 DIAGNOSIS — I429 Cardiomyopathy, unspecified: Secondary | ICD-10-CM | POA: Diagnosis not present

## 2021-12-18 DIAGNOSIS — I70229 Atherosclerosis of native arteries of extremities with rest pain, unspecified extremity: Secondary | ICD-10-CM | POA: Diagnosis not present

## 2021-12-18 DIAGNOSIS — I70209 Unspecified atherosclerosis of native arteries of extremities, unspecified extremity: Secondary | ICD-10-CM | POA: Diagnosis not present

## 2021-12-18 DIAGNOSIS — D649 Anemia, unspecified: Secondary | ICD-10-CM | POA: Diagnosis not present

## 2021-12-18 LAB — BASIC METABOLIC PANEL
Anion gap: 7 (ref 5–15)
BUN: 9 mg/dL (ref 8–23)
CO2: 29 mmol/L (ref 22–32)
Calcium: 8.5 mg/dL — ABNORMAL LOW (ref 8.9–10.3)
Chloride: 99 mmol/L (ref 98–111)
Creatinine, Ser: 0.84 mg/dL (ref 0.44–1.00)
GFR, Estimated: >60 mL/min (ref >60)
Glucose, Bld: 125 mg/dL — ABNORMAL HIGH (ref 70–99)
Potassium: 4.3 mmol/L (ref 3.5–5.1)
Sodium: 135 mmol/L (ref 135–145)

## 2021-12-18 LAB — GLUCOSE, CAPILLARY
Glucose-Capillary: 106 mg/dL — ABNORMAL HIGH (ref 70–99)
Glucose-Capillary: 148 mg/dL — ABNORMAL HIGH (ref 70–99)
Glucose-Capillary: 152 mg/dL — ABNORMAL HIGH (ref 70–99)
Glucose-Capillary: 139 mg/dL — ABNORMAL HIGH (ref 70–99)

## 2021-12-18 LAB — MAGNESIUM: Magnesium: 2 mg/dL (ref 1.7–2.4)

## 2021-12-18 LAB — CBC
HCT: 29 % — ABNORMAL LOW (ref 36.0–46.0)
Hemoglobin: 9.2 g/dL — ABNORMAL LOW (ref 12.0–15.0)
MCH: 28.6 pg (ref 26.0–34.0)
MCHC: 31.7 g/dL (ref 30.0–36.0)
MCV: 90.1 fL (ref 80.0–100.0)
Platelets: 274 10*3/uL (ref 150–400)
RBC: 3.22 MIL/uL — ABNORMAL LOW (ref 3.87–5.11)
RDW: 15.3 % (ref 11.5–15.5)
WBC: 12.2 10*3/uL — ABNORMAL HIGH (ref 4.0–10.5)
nRBC: 0.2 % (ref 0.0–0.2)

## 2021-12-18 LAB — HEPARIN LEVEL (UNFRACTIONATED)
Heparin Unfractionated: 0.21 IU/mL — ABNORMAL LOW (ref 0.30–0.70)
Heparin Unfractionated: 0.32 IU/mL (ref 0.30–0.70)
Heparin Unfractionated: 0.27 IU/mL — ABNORMAL LOW (ref 0.30–0.70)

## 2021-12-18 MED ORDER — METOPROLOL TARTRATE 25 MG PO TABS
25.0000 mg | ORAL_TABLET | Freq: Two times a day (BID) | ORAL | Status: DC
  Administered 2021-12-18 – 2021-12-19 (×3): 25 mg via ORAL
  Filled 2021-12-18 (×3): qty 1

## 2021-12-18 NOTE — Progress Notes (Signed)
ANTICOAGULATION CONSULT NOTE   Pharmacy Consult for heparin Indication:  Acute arterial occlusion  Allergies  Allergen Reactions   Penicillins Hives and Shortness Of Breath   Azithromycin Hives   Drug Ingredient [Cephalexin] Hives    Patient Measurements: Height: 5\' 1"  (154.9 cm) Weight: 91 kg (200 lb 9.9 oz) IBW/kg (Calculated) : 47.8 Heparin Dosing Weight: 86 kg   Vital Signs: Temp: 98 F (36.7 C) (02/05 1123) Temp Source: Oral (02/05 1123) BP: 113/60 (02/05 1123) Pulse Rate: 96 (02/05 1123)  Labs: Recent Labs    12/15/21 1254 12/15/21 1849 12/16/21 0408 12/16/21 0909 12/16/21 1434 12/16/21 2320 12/17/21 0125 12/17/21 0602 12/18/21 0211 12/18/21 1032  HGB  --    < > 8.5*  --  9.1*  --  9.2*  --  9.2*  --   HCT  --    < > 25.3*  --  27.7*  --  27.8*  --  29.0*  --   PLT  --    < > 235  --   --   --  231  --  274  --   HEPARINUNFRC  --    < >  --    < > 0.78*   < >  --  0.56 0.21* 0.32  CREATININE  --   --  1.04*  --   --   --  0.99  --  0.84  --   TROPONINIHS 15  --   --   --   --   --   --   --   --   --    < > = values in this interval not displayed.     Estimated Creatinine Clearance: 63.1 mL/min (by C-G formula based on SCr of 0.84 mg/dL).   Assessment: 68 YOF with pain in her R leg and loss of pulses in extremity concerning for acute arterial occlusion. She is now s/p thrombectomy/fasciotomy on 2/1 and heparin previously held due to bleeding from the  fasciotomy incisions.   Heparin level therapeutic at 0.32 after dose increase. No bleeding noted. CBC stable  Goal of Therapy:  Heparin level 0.3-0.7 units/ml Monitor platelets by anticoagulation protocol: Yes   Plan:  -Continue heparin at 1600 units / hr - 6 hour confirmatoryheparin level - Daily heparin level and CBC. - F/u plans for oral anticoagulation eventually.  Thank you for allowing pharmacy to participate in this patient's care.  Reatha Harps, PharmD PGY1 Pharmacy Resident 12/18/2021  11:48 AM Check [email or web address] for unit specific pharmacy number

## 2021-12-18 NOTE — TOC Progression Note (Signed)
Transition of Care [facility]) - Progression Note    Patient Details  Name: Kelly Lee MRN: [id number] Date of Birth: 09/10/1951  Transition of Care [facility]) CM/SW [place], [place] Phone Number: [phone] 12/18/2021, 10:47 AM  Clinical Narrative:     CSW met with pt to provide bed offers. Pt explained that she was not familiar with the facilities and would like to return to [facility] if possible due to being there before. CSW explained that she doubts if a admission staff is on call today however would try. Pt stated that if [facility] did not have a bed then she would consider [facility].  CSW called [facility] at [phone] and was informed that admission staff did not work on Sunday. CSW was directed to call back tomorrow and ask for Tam[facility].  TOC team will continue to assist with discharge planning needs.   Expected Discharge Plan: [place] Barriers to Discharge: Continued Medical Work up  Expected Discharge Plan and Services Expected Discharge Plan: [place] In-house Referral: Clinical Social Work     Living arrangements for the past 2 months: Single Family Home                                       Social Determinants of Health (SDOH) Interventions    Readmission Risk Interventions Readmission Risk Prevention Plan 12/12/2019  Transportation Screening Complete  PCP or Specialist Appt within 5-7 Days Not Complete  Not Complete comments plan for SNF  Home Care Screening Complete  Medication Review (RN CM) Referral to Pharmacy  Some recent data might be hidden

## 2021-12-18 NOTE — Progress Notes (Addendum)
°  Progress Note    12/18/2021 9:32 AM 4 Days Post-Op  Subjective:  no complaints   Vitals:   12/18/21 0350 12/18/21 0750  BP: (!) 120/57 (!) 120/53  Pulse: 88 88  Resp: 18 12  Temp: 97.7 F (36.5 C) 97.8 F (36.6 C)  SpO2: 95% 99%   Physical Exam: Cardiac:  regular Lungs:  non labored Incisions:  right lower extremity with fullness of lateral fasciotomy site. Kerlix and ACE reapplied Extremities:  well perfused and warm with palpable DP Neurologic: alert and oriented  CBC    Component Value Date/Time   WBC 12.2 (H) 12/18/2021 0211   RBC 3.22 (L) 12/18/2021 0211   HGB 9.2 (L) 12/18/2021 0211   HCT 29.0 (L) 12/18/2021 0211   PLT 274 12/18/2021 0211   MCV 90.1 12/18/2021 0211   MCH 28.6 12/18/2021 0211   MCHC 31.7 12/18/2021 0211   RDW 15.3 12/18/2021 0211   LYMPHSABS 2.2 12/14/2021 1920   MONOABS 0.6 12/14/2021 1920   EOSABS 0.2 12/14/2021 1920   BASOSABS 0.1 12/14/2021 1920    BMET    Component Value Date/Time   NA 135 12/18/2021 0211   K 4.3 12/18/2021 0211   CL 99 12/18/2021 0211   CO2 29 12/18/2021 0211   GLUCOSE 125 (H) 12/18/2021 0211   BUN 9 12/18/2021 0211   CREATININE 0.84 12/18/2021 0211   CALCIUM 8.5 (L) 12/18/2021 0211   GFRNONAA >60 12/18/2021 0211   GFRAA >60 12/17/2019 0347    INR No results found for: INR   Intake/Output Summary (Last 24 hours) at 12/18/2021 0932 Last data filed at 12/18/2021 0350 Gross per 24 hour  Intake 977.29 ml  Output 4336 ml  Net -3358.71 ml     Assessment/Plan:  71 y.o. female is s/p RLE thrombectomy and 4 compartment fasciotomies 4 Days Post-Op   CV:  BP stable, no further issues.  On heparin drip for h/o afib Heme:  H&H stable post transfusion Pulm:  no issues GI:  diet as tolerated Renal:  creatinine normal, good UOP ID:  no issues Diabetes:  BG better controlled Extremities: Monitor right leg fasciotomy sites, fullness in anterior and lateral compartments. Unchanged from yesterday Left ankle  ulcer:  continue dry dressings, keep pressure off of lateral malleolus Continue to mobilize as tolerated   Dispo:SNF pending choice and auth  DVT prophylaxis:  IV heparin   Graceann Congress, PA-C [facility] [phone] 12/18/2021 9:32 AM   I agree with above.  I have seen and evaluated the patient.  She continues to have palpable pedal pulses.  There still remains some fullness on the anterior lateral skin closure site which is unchanged from yesterday and soft.  This will need to be closely monitored.  She remains on IV heparin.  This will need to be transition to oral anticoagulation prior to discharge.  Durene Cal

## 2021-12-18 NOTE — Progress Notes (Signed)
ANTICOAGULATION CONSULT NOTE   Pharmacy Consult for heparin Indication:  Acute arterial occlusion  Allergies  Allergen Reactions   Penicillins Hives and Shortness Of Breath   Azithromycin Hives   Drug Ingredient [Cephalexin] Hives    Patient Measurements: Height: 5\' 1"  (154.9 cm) Weight: 91 kg (200 lb 9.9 oz) IBW/kg (Calculated) : 47.8 Heparin Dosing Weight: 86 kg   Vital Signs: Temp: 98.3 F (36.8 C) (02/04 2314) Temp Source: Oral (02/04 2314) BP: 95/84 (02/04 2314) Pulse Rate: 97 (02/04 2314)  Labs: Recent Labs    12/15/21 1254 12/15/21 1849 12/16/21 0408 12/16/21 0909 12/16/21 1434 12/16/21 2320 12/17/21 0125 12/17/21 0602 12/18/21 0211  HGB  --    < > 8.5*  --  9.1*  --  9.2*  --  9.2*  HCT  --    < > 25.3*  --  27.7*  --  27.8*  --  29.0*  PLT  --    < > 235  --   --   --  231  --  274  HEPARINUNFRC  --    < >  --    < > 0.78* 0.58  --  0.56 0.21*  CREATININE  --   --  1.04*  --   --   --  0.99  --  0.84  TROPONINIHS 15  --   --   --   --   --   --   --   --    < > = values in this interval not displayed.     Estimated Creatinine Clearance: 63.1 mL/min (by C-G formula based on SCr of 0.84 mg/dL).   Assessment: 64 YOF with pain in her R leg and loss of pulses in extremity concerning for acute arterial occlusion. She is now s/p thrombectomy/fasciotomy on 2/1 and heparin previously held due to bleeding from the  fasciotomy incisions  Heparin level low this am at 0.21  Goal of Therapy:  Heparin level 0.3-0.7 units/ml Monitor platelets by anticoagulation protocol: Yes   Plan:  -Increase heparin to 1600 units / hr - 6 hour heparin level -Daily heparin level and CBC. -F/u plans for oral anticoagulation eventually.  Thank you for allowing pharmacy to be a part of this patients care.  Thank you Anette Guarneri, PharmD  Please check AMION for all [place] numbers After 10:00 PM, call [place] [phone]

## 2021-12-18 NOTE — Progress Notes (Signed)
Consultation Progress Note   Patient: Kelly Lee VOZ:[id number] DOB: 28-Jun-1951 DOA: 12/14/2021 DOS: the patient was seen and examined on 12/18/2021 Primary service: Leonie Douglas, MD  Brief hospital course: 71 y/o female admitted to the hospital with acute right leg ischemia secondary to arterial thrombus. She was admitted to the hospital under vascular surgery and underwent thrombectomy and 4 compartment fasciotomy. Post operatively, she developed hypotension and anemia requiring transfer to [facility] for closer monitoring. [facility] hospitalists asked to follow patient for medical management  Assessment and Plan: * Critical lower limb ischemia (HCC)- (present on admission) Admitted to vascular surgery service. S/p right lower leg arterial thrombectomy and right four compartment fasciotomy. Currently on IV heparin. Management per vascular surgery service.  Cardiomyopathy (HCC) Noted to have EF of 35-40% on echo from 12/01/2020 Echo repeated on 2/2 and EF noted to be normal Appears to be on GDMT Resuming metoprolol, lasix and aldactone. She is also on jardiance  Anemia- (present on admission) Hemoglobin 11.6 on admission Trended down to 8.1 Possibly hemodilution +/- blood loss during surgery She has been transfused a total of 2 unit prbc thus far Follow up hemoglobin stable at 9.2 Continue to follow hemoglobin  Type 2 diabetes mellitus, with long-term current use of insulin (HCC) She takes Lantus, metformin and jardiance as outpatient Continued on lantus and SSI since admission Resume outpatient regimen on discharge Adjusting lantus and meal coverage novolog based on blood sugars  PAF (paroxysmal atrial fibrillation) (HCC)- (present on admission) Chronically on metoprolol 200 mg daily Noted to be sinus bradycardia on admission Dose of metoprolol confirmed with patient's daughter Her CHA2DS2-VASc score of 8 Previous DC summary from [facility] in 11/2020 indicated that she was discharged with  eliquis Patient reports that at some point this was discontinued Her daughter is unaware of any contraindication to anticoagulation Currently she is on IV heparin. Would recommend transitioning heparin to eliquis when felt appropriate by vascular Since blood pressure now improved, metoprolol has been restarted  Morbid obesity with BMI of 40.0-44.9, adult (HCC) Chronic  Hyperthyroidism- (present on admission) She is chronically on methimazole which was continued on admission  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 Cardiac enzymes checked and noted to be negative Echocardiogram shows normal ejection fraction Had some atypical chest pain overnight, now resolved   Chronic respiratory failure with hypoxia (HCC)- (present on admission) Appears to chronically be on 2L Resp status currently stable  Hypertension- (present on admission) She is on multiple antihypertensives at home including Toprol, Lasix, Aldactone  These were continued on admission, but patient became hypotensive so they were placed on hold BP is now elevated Metoprolol restarted, titrate up as tolerated  Hypotension-resolved as of 12/17/2021 Noted to be hypotensive on 2/2 and transferred to [facility] for closer monitoring troponins normal, Echo with normal EF Possibly related to anemia and blood loss Hold antihypertensives          TRH will continue to follow the patient.  Subjective: Denies any shortness of breath.  Feels that her pain is controlled.  Physical Exam: Vitals:   12/18/21 1115 12/18/21 1123 12/18/21 1612 12/18/21 2005  BP:  113/60 139/71 105/76  Pulse:  96 90 99  Resp: 17 12 10 16   Temp:  98 F (36.7 C) 98.8 F (37.1 C) 98 F (36.7 C)  TempSrc:  Oral Oral Oral  SpO2:  96% 95% 92%  Weight:      Height:  General exam: Alert, awake, oriented x 3 Respiratory system: Clear  to auscultation. Respiratory effort normal. Cardiovascular system: Regular. No murmurs, rubs, gallops. Gastrointestinal system: Abdomen is nondistended, soft and nontender. No organomegaly or masses felt. Normal bowel sounds heard. Central nervous system: Alert and oriented. No focal neurological deficits. Extremities: Right lower extremities wrapped in dressing Skin: No rashes, lesions or ulcers Psychiatry: Judgement and insight appear normal. Mood & affect appropriate.    Data Reviewed:  CBC and chemistry reviewed.  Family Communication: Discussed with patient   Time spent: 35 minutes.  Author: Erick Blinks, MD 12/18/2021 9:26 PM  For on call review [email or web address].

## 2021-12-18 NOTE — Progress Notes (Signed)
ANTICOAGULATION CONSULT NOTE   Pharmacy Consult for heparin Indication:  Acute arterial occlusion  Allergies  Allergen Reactions   Penicillins Hives and Shortness Of Breath   Azithromycin Hives   Drug Ingredient [Cephalexin] Hives    Patient Measurements: Height: 5\' 1"  (154.9 cm) Weight: 91 kg (200 lb 9.9 oz) IBW/kg (Calculated) : 47.8 Heparin Dosing Weight: 86 kg   Vital Signs: Temp: 98.8 F (37.1 C) (02/05 1612) Temp Source: Oral (02/05 1612) BP: 139/71 (02/05 1612) Pulse Rate: 90 (02/05 1612)  Labs: Recent Labs    12/16/21 0408 12/16/21 0909 12/16/21 1434 12/16/21 2320 12/17/21 0125 12/17/21 0602 12/18/21 0211 12/18/21 1032 12/18/21 1623  HGB 8.5*  --  9.1*  --  9.2*  --  9.2*  --   --   HCT 25.3*  --  27.7*  --  27.8*  --  29.0*  --   --   PLT 235  --   --   --  231  --  274  --   --   HEPARINUNFRC  --    < > 0.78*   < >  --    < > 0.21* 0.32 0.27*  CREATININE 1.04*  --   --   --  0.99  --  0.84  --   --    < > = values in this interval not displayed.    Estimated Creatinine Clearance: 63.1 mL/min (by C-G formula based on SCr of 0.84 mg/dL).   Assessment: 27 YOF with pain in her R leg and loss of pulses in extremity concerning for acute arterial occlusion. She is now s/p thrombectomy/fasciotomy on 2/1 and heparin previously held due to bleeding from the fasciotomy incisions.   Heparin level subtherapeutic at 0.27 on 1600 units/hr. Per RN, wound site looks the same with serosanguinous output. Heparin was out for a few minutes but was not held for longer than that.  Goal of Therapy:  Heparin level 0.3-0.7 units/ml Monitor platelets by anticoagulation protocol: Yes   Plan:  Increase heparin at 1700 units / hr F/u  8hr level  Daily heparin level and CBC F/u plans for oral anticoagulation eventually  Thank you for allowing pharmacy to participate in this patient's care.   Benetta Spar, PharmD, BCPS, BCCP Clinical Pharmacist  Please check AMION for all  [place] phone numbers After 10:00 PM, call [place] [phone]

## 2021-12-19 DIAGNOSIS — D649 Anemia, unspecified: Secondary | ICD-10-CM | POA: Diagnosis not present

## 2021-12-19 DIAGNOSIS — I429 Cardiomyopathy, unspecified: Secondary | ICD-10-CM | POA: Diagnosis not present

## 2021-12-19 DIAGNOSIS — I70229 Atherosclerosis of native arteries of extremities with rest pain, unspecified extremity: Secondary | ICD-10-CM | POA: Diagnosis not present

## 2021-12-19 DIAGNOSIS — I70209 Unspecified atherosclerosis of native arteries of extremities, unspecified extremity: Secondary | ICD-10-CM | POA: Diagnosis not present

## 2021-12-19 LAB — CBC
HCT: 29.9 % — ABNORMAL LOW (ref 36.0–46.0)
Hemoglobin: 9.7 g/dL — ABNORMAL LOW (ref 12.0–15.0)
MCH: 29.4 pg (ref 26.0–34.0)
MCHC: 32.4 g/dL (ref 30.0–36.0)
MCV: 90.6 fL (ref 80.0–100.0)
Platelets: 290 10*3/uL (ref 150–400)
RBC: 3.3 MIL/uL — ABNORMAL LOW (ref 3.87–5.11)
RDW: 15.2 % (ref 11.5–15.5)
WBC: 10.7 10*3/uL — ABNORMAL HIGH (ref 4.0–10.5)
nRBC: 0.3 % — ABNORMAL HIGH (ref 0.0–0.2)

## 2021-12-19 LAB — HEPARIN LEVEL (UNFRACTIONATED): Heparin Unfractionated: 0.33 IU/mL (ref 0.30–0.70)

## 2021-12-19 LAB — GLUCOSE, CAPILLARY
Glucose-Capillary: 131 mg/dL — ABNORMAL HIGH (ref 70–99)
Glucose-Capillary: 136 mg/dL — ABNORMAL HIGH (ref 70–99)
Glucose-Capillary: 129 mg/dL — ABNORMAL HIGH (ref 70–99)
Glucose-Capillary: 129 mg/dL — ABNORMAL HIGH (ref 70–99)

## 2021-12-19 MED ORDER — APIXABAN 5 MG PO TABS
5.0000 mg | ORAL_TABLET | Freq: Two times a day (BID) | ORAL | Status: DC
  Administered 2021-12-20 (×2): 5 mg via ORAL
  Filled 2021-12-19 (×2): qty 1

## 2021-12-19 MED ORDER — METOPROLOL SUCCINATE ER 25 MG PO TB24: 25.0000 mg | ORAL_TABLET | Freq: Two times a day (BID) | ORAL | Status: AC

## 2021-12-19 MED ORDER — LANTUS SOLOSTAR 100 UNIT/ML ~~LOC~~ SOPN: 40.0000 [IU] | PEN_INJECTOR | Freq: Every day | SUBCUTANEOUS | 11 refills | Status: AC

## 2021-12-19 MED ORDER — APIXABAN 5 MG PO TABS: 5.0000 mg | ORAL_TABLET | Freq: Two times a day (BID) | ORAL | Status: AC

## 2021-12-19 MED ORDER — ROSUVASTATIN CALCIUM 20 MG PO TABS: 20.0000 mg | ORAL_TABLET | Freq: Every day | ORAL | Status: DC

## 2021-12-19 MED ORDER — METOPROLOL SUCCINATE ER 25 MG PO TB24
25.0000 mg | ORAL_TABLET | Freq: Two times a day (BID) | ORAL | Status: DC
  Administered 2021-12-20 – 2021-12-21 (×3): 25 mg via ORAL
  Filled 2021-12-19 (×3): qty 1

## 2021-12-19 NOTE — Progress Notes (Signed)
ANTICOAGULATION CONSULT NOTE   Pharmacy Consult for heparin>>apixaban Indication:  Acute arterial occlusion  Allergies  Allergen Reactions   Penicillins Hives and Shortness Of Breath   Azithromycin Hives   Drug Ingredient [Cephalexin] Hives    Patient Measurements: Height: 5\' 1"  (154.9 cm) Weight: 91 kg (200 lb 9.9 oz) IBW/kg (Calculated) : 47.8 Heparin Dosing Weight: 86 kg   Vital Signs: Temp: 98 F (36.7 C) (02/06 2001) Temp Source: Oral (02/06 2001) BP: 128/58 (02/06 2001) Pulse Rate: 96 (02/06 2001)  Labs: Recent Labs    12/17/21 0125 12/17/21 0602 12/18/21 0211 12/18/21 1032 12/18/21 1623 12/19/21 0359  HGB 9.2*  --  9.2*  --   --  9.7*  HCT 27.8*  --  29.0*  --   --  29.9*  PLT 231  --  274  --   --  290  HEPARINUNFRC  --    < > 0.21* 0.32 0.27* 0.33  CREATININE 0.99  --  0.84  --   --   --    < > = values in this interval not displayed.     Estimated Creatinine Clearance: 63.1 mL/min (by C-G formula based on SCr of 0.84 mg/dL).   Assessment: 53 YOF with pain in her R leg and loss of pulses in extremity concerning for acute arterial occlusion. She is now s/p thrombectomy/fasciotomy on 2/1 and heparin previously held due to bleeding from the fasciotomy incisions.   Order to transition to apixaban tonight.   Goal of Therapy:  Monitor platelets by anticoagulation protocol: Yes   Plan:   Dc heparin Apixaban 5mg  BID Rx will follow peripherally  Onnie Boer, PharmD, BCIDP, AAHIVP, CPP Infectious Disease Pharmacist 12/19/2021 9:36 PM

## 2021-12-19 NOTE — Progress Notes (Signed)
Consultation Progress Note   Patient: Kelly Lee [id number] DOB: [birth year] DOA: 12/14/2021 DOS: the patient was seen and examined on 12/19/2021 Primary service: Cherre Robins, MD  Brief hospital course: 71 y/o female admitted to the hospital with acute right leg ischemia secondary to arterial thrombus. She was admitted to the hospital under vascular surgery and underwent thrombectomy and 4 compartment fasciotomy. Post operatively, she developed hypotension and anemia requiring transfer to [facility] for closer monitoring. [facility] hospitalists asked to follow patient for medical management  Assessment and Plan: * Critical lower limb ischemia ([place])- (present on admission) Admitted to vascular surgery service. S/p right lower leg arterial thrombectomy and right four compartment fasciotomy. Currently on IV heparin, but being transitioned to Eliquis today. Management per vascular surgery service.  Cardiomyopathy ([place]) Noted to have EF of 35-40% on echo from 12/01/2020 Echo repeated on 2/2 and EF noted to be normal Appears to be on GDMT Resuming metoprolol, lasix and aldactone. She is also on jardiance  Anemia- (present on admission) Hemoglobin 11.6 on admission Trended down to 8.1 Possibly hemodilution +/- blood loss during surgery She has been transfused a total of 2 unit prbc thus far Follow up hemoglobin stable at 9.2 Continue to follow hemoglobin  Type 2 diabetes mellitus, with long-term current use of insulin ([place]) She takes Lantus, metformin and jardiance as outpatient Continued on lantus and SSI since admission Resume outpatient regimen on discharge Adjusting lantus and meal coverage novolog based on blood sugars  PAF (paroxysmal atrial fibrillation) ([place])- (present on admission) Chronically on metoprolol 200 mg daily Noted to be sinus bradycardia on admission Dose of metoprolol confirmed with patient's daughter Her CHA2DS2-VASc score of 8 Previous DC summary from [facility] in 11/2020  indicated that she was discharged with eliquis Patient reports that at some point this was discontinued Her daughter is unaware of any contraindication to anticoagulation She can treat with IV heparin and I will be transitioned to Eliquis Heart rate has been stable on metoprolol, although this is a reduced dose We will continue reduced dose until she can follow-up with her primary cardiologist  Morbid obesity with BMI of 40.0-44.9, adult ([place]) Chronic  Hyperthyroidism- (present on admission) She is chronically on methimazole which was continued on admission  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 Cardiac enzymes checked and noted to be negative Echocardiogram shows normal ejection fraction Had some atypical chest pain overnight, now resolved   Chronic respiratory failure with hypoxia ([place])- (present on admission) Appears to chronically be on 2L Resp status currently stable  Hypertension- (present on admission) She is on multiple antihypertensives at home including Toprol, Lasix, Aldactone  These were continued on admission, but patient became hypotensive so they were placed on hold BP is now elevated Metoprolol restarted, titrate up as tolerated  Hypotension-resolved as of 12/17/2021           TR[facility] will continue to follow the patient.  Subjective: Reports some pain in her leg.  Otherwise no new complaints  Physical Exam: Vitals:   12/19/21 0800 12/19/21 1120 12/19/21 1548 12/19/21 2001  BP: 120/88 100/60 103/64 (!) 128/58  Pulse:    96  Resp: 15 16 17 17   Temp: 97.7 F (36.5 C) 97.7 F (36.5 C) 97.6 F (36.4 C) 98 F (36.7 C)  TempSrc: Oral Oral  Oral  SpO2:    96%  Weight:      Height:  General exam: Alert, awake, oriented x 3 Respiratory system: Clear to auscultation. Respiratory effort normal. Cardiovascular system:RRR. No murmurs,  rubs, gallops. Gastrointestinal system: Abdomen is nondistended, soft and nontender. No organomegaly or masses felt. Normal bowel sounds heard. Central nervous system: Alert and oriented. No focal neurological deficits. Extremities: Right lower extremity is wrapped in dressing Skin: No rashes, lesions or ulcers Psychiatry: Judgement and insight appear normal. Mood & affect appropriate.    Data Reviewed:  There are no new results to review at this time.  Family Communication: Discussed with patient   Time spent: 35 minutes.  Author: Kathie Dike, MD 12/19/2021 9:40 PM  For on call review [email or web address].

## 2021-12-19 NOTE — Progress Notes (Signed)
ANTICOAGULATION CONSULT NOTE   Pharmacy Consult for heparin Indication:  Acute arterial occlusion  Allergies  Allergen Reactions   Penicillins Hives and Shortness Of Breath   Azithromycin Hives   Drug Ingredient [Cephalexin] Hives    Patient Measurements: Height: 5\' 1"  (154.9 cm) Weight: 91 kg (200 lb 9.9 oz) IBW/kg (Calculated) : 47.8 Heparin Dosing Weight: 86 kg   Vital Signs: Temp: 97.7 F (36.5 C) (02/06 0800) Temp Source: Oral (02/06 0800) BP: 120/88 (02/06 0800) Pulse Rate: 79 (02/06 0445)  Labs: Recent Labs    12/17/21 0125 12/17/21 0602 12/18/21 0211 12/18/21 1032 12/18/21 1623 12/19/21 0359  HGB 9.2*  --  9.2*  --   --  9.7*  HCT 27.8*  --  29.0*  --   --  29.9*  PLT 231  --  274  --   --  290  HEPARINUNFRC  --    < > 0.21* 0.32 0.27* 0.33  CREATININE 0.99  --  0.84  --   --   --    < > = values in this interval not displayed.     Estimated Creatinine Clearance: 63.1 mL/min (by C-G formula based on SCr of 0.84 mg/dL).   Assessment: 31 YOF with pain in her R leg and loss of pulses in extremity concerning for acute arterial occlusion. She is now s/p thrombectomy/fasciotomy on 2/1 and heparin previously held due to bleeding from the fasciotomy incisions.   Heparin level subtherapeutic at 0.33 on 1700 units/hr.   Goal of Therapy:  Heparin level 0.3-0.7 units/ml Monitor platelets by anticoagulation protocol: Yes   Plan:  Continue heparin at 1700 units / hr Daily heparin level and CBC Per vascular- transition to Eliquis prior to d/c  Thank you for allowing pharmacy to participate in this patient's care.   Hildred Laser, PharmD Clinical Pharmacist **Pharmacist phone directory can now be found on [email or web address] ([email or web address]).  Listed under [place].

## 2021-12-19 NOTE — Progress Notes (Addendum)
°  Progress Note    12/19/2021 7:11 AM 5 Days Post-Op  Subjective:  no major complaints   Vitals:   12/18/21 2315 12/19/21 0445  BP: 119/80 (!) 142/112  Pulse: (!) 106 79  Resp: 18 20  Temp: 98.4 F (36.9 C) 97.9 F (36.6 C)  SpO2: 95% 95%   Physical Exam: Cardiac:  regular Lungs:  non labored Incisions:  right anterior and lateral fasciotomy sites with fullness, lateral more so then anterior. Minimally changed from last couple days. Some bloody oozing from anterior site. Kerlix and ACE applied Extremities:  Bilateral lower extremities well perfused and warm. Palpable DP bilaterally/ Motor and sensation intact Neurologic: alert and oriented  CBC    Component Value Date/Time   WBC 10.7 (H) 12/19/2021 0359   RBC 3.30 (L) 12/19/2021 0359   HGB 9.7 (L) 12/19/2021 0359   HCT 29.9 (L) 12/19/2021 0359   PLT 290 12/19/2021 0359   MCV 90.6 12/19/2021 0359   MCH 29.4 12/19/2021 0359   MCHC 32.4 12/19/2021 0359   RDW 15.2 12/19/2021 0359   LYMPHSABS 2.2 12/14/2021 1920   MONOABS 0.6 12/14/2021 1920   EOSABS 0.2 12/14/2021 1920   BASOSABS 0.1 12/14/2021 1920    BMET    Component Value Date/Time   NA 135 12/18/2021 0211   K 4.3 12/18/2021 0211   CL 99 12/18/2021 0211   CO2 29 12/18/2021 0211   GLUCOSE 125 (H) 12/18/2021 0211   BUN 9 12/18/2021 0211   CREATININE 0.84 12/18/2021 0211   CALCIUM 8.5 (L) 12/18/2021 0211   GFRNONAA >60 12/18/2021 0211   GFRAA >60 12/17/2019 0347    INR No results found for: INR   Intake/Output Summary (Last 24 hours) at 12/19/2021 0711 Last data filed at 12/19/2021 0600 Gross per 24 hour  Intake 1040.71 ml  Output 3000 ml  Net -1959.29 ml     Assessment/Plan:  71 y.o. female is s/p  RLE thrombectomy and 4 compartment fasciotomies 5 Days Post-Op.  RLE well perfused and warm with palpable Dp Fasciotomy sites with fullness but appears unchanged Left lateral malleolus stable. Mepilex applied Hemodynamically stable Appreciate TRH  assistance Will transition to Eliquis prior to d/c OOB to chair/Mobilize today TOC assisting with SNF placement   Graceann Congress, PA-C [facility] [phone] 12/19/2021 7:11 AM  VASCULAR STAFF ADDENDUM: I have independently interviewed and examined the patient. I agree with the above.  Transition to Eliquis.  Likely ready for SNF next 24-48 hours. Will need Prevena removed prior to discharge. Continue local care to the calf.  Rande Brunt. Lenell Antu, MD [facility] Phone Number: [phone] 12/19/2021 9:35 AM

## 2021-12-19 NOTE — Progress Notes (Signed)
Physical Therapy Treatment Patient Details Name: Kelly Lee MRN: [id number] DOB: [birth year] Today's Date: 12/19/2021   History of Present Illness 71 y.o. female presenting to ED 2/1 with RLE acute critical limb ischemia s/p emergent RLE arterial thrombectomy and R calf four compartment fasciotomy 12/15/21. Admission complicated by post-op hypotension with SPB in 90's. Transferred to ICU 2/2 secondary to hypotension. PMHx significant for DMII, HTN, paroxysmal A-fib, diastolic HF and fall with L hip fx 11/2019.    PT Comments    Patient progressing well towards PT goals. Tolerated taking a few steps with min A and use of RW for support today, better able to place weight through RLE with encouragement. Limited right ankle AROM noted with swelling present entire LE and decreased sensation as well. Pt with + nausea post transfer. VSS on RA. Continues to be appropriate for SNF. Will follow.    Recommendations for follow up therapy are one component of a multi-disciplinary discharge planning process, led by the attending physician.  Recommendations may be updated based on patient status, additional functional criteria and insurance authorization.  Follow Up Recommendations  Skilled nursing-short term rehab (<3 hours/day)     Assistance Recommended at Discharge Intermittent Supervision/Assistance  Patient can return home with the following A lot of help with bathing/dressing/bathroom;Assistance with cooking/housework;Assist for transportation;Help with stairs or ramp for entrance;A little help with walking and/or transfers;A little help with bathing/dressing/bathroom   Equipment Recommendations  None recommended by PT    Recommendations for Other Services       Precautions / Restrictions Precautions Precautions: Fall Precaution Comments: Hx of falls; monitor BP; wound vac Restrictions Weight Bearing Restrictions: Yes RLE Weight Bearing: Weight bearing as tolerated     Mobility  Bed  Mobility Overal bed mobility: Needs Assistance Bed Mobility: Supine to Sit     Supine to sit: Min guard, HOB elevated     General bed mobility comments: Use of rails to get to EOB.    Transfers Overall transfer level: Needs assistance Equipment used: Rolling walker (2 wheels) Transfers: Sit to/from Stand, Bed to chair/wheelchair/BSC Sit to Stand: Mod assist, Min assist   Step pivot transfers: Min assist       General transfer comment: Mod A initially progressing to min A to stand from elevated bed height with cues for hand placement/technique. Reluctant to place weight through RLE but able too. Able to take a few steps to get to chair with assist for RW management, + nausea.    Ambulation/Gait                   Stairs             Wheelchair Mobility    Modified Rankin (Stroke Patients Only)       Balance Overall balance assessment: Needs assistance Sitting-balance support: Feet supported Sitting balance-Leahy Scale: Good     Standing balance support: During functional activity, Reliant on assistive device for balance Standing balance-Leahy Scale: Poor                              Cognition Arousal/Alertness: Awake/alert Behavior During Therapy: Anxious Overall Cognitive Status: Within Functional Limits for tasks assessed                                          Exercises  General Comments General comments (skin integrity, edema, etc.): VSS on RA. Some oozing from staples on RLL, RN present and assisted with wrapping with gauze/ace wrap.      Pertinent Vitals/Pain Pain Assessment Pain Assessment: Faces Faces Pain Scale: Hurts even more Pain Location: R groin and RLE (surgical) Pain Descriptors / Indicators: Sore, Operative site guarding, Guarding, Grimacing Pain Intervention(s): Monitored during session, Limited activity within patient's tolerance, Repositioned, Premedicated before session    Home  Living                          Prior Function            PT Goals (current goals can now be found in the care plan section) Progress towards PT goals: Progressing toward goals    Frequency    Min 3X/week      PT Plan Current plan remains appropriate    Co-evaluation              AM-PAC PT "6 Clicks" Mobility   Outcome Measure  Help needed turning from your back to your side while in a flat bed without using bedrails?: A Little Help needed moving from lying on your back to sitting on the side of a flat bed without using bedrails?: A Little Help needed moving to and from a bed to a chair (including a wheelchair)?: A Lot Help needed standing up from a chair using your arms (e.g., wheelchair or bedside chair)?: A Lot Help needed to walk in hospital room?: A Little Help needed climbing 3-5 steps with a railing? : A Lot 6 Click Score: 15    End of Session Equipment Utilized During Treatment: Gait belt Activity Tolerance: Patient limited by pain;Patient tolerated treatment well Patient left: in chair;with call bell/phone within reach;with chair alarm set Nurse Communication: Mobility status;Other (comment) (purewick,) PT Visit Diagnosis: History of falling (Z91.81);Muscle weakness (generalized) (M62.81);Difficulty in walking, not elsewhere classified (R26.2);Pain Pain - Right/Left: Right Pain - part of body: Leg     Time: [id number] PT Time Calculation (min) (ACUTE ONLY): 26 min  Charges:  $Therapeutic Activity: 23-37 mins                     Marisa Severin, PT, DPT Acute Rehabilitation Services Pager [phone] Office [place] 12/19/2021, 1:03 PM

## 2021-12-19 NOTE — TOC Progression Note (Signed)
Transition of Care [facility] - Progression Note    Patient Details  Name: Kelly Lee MRN: [id number] Date of Birth: [birth year]  Transition of Care [facility]) CM/SW Contact  Eduard Roux, [place] Phone Number: 12/19/2021, 2:27 PM  Clinical Narrative:     [facility] - unable to make offer Left message for [facility] - confirmed bed offer and can admit tomorrow if insurance authorization is approved. [employer] - reference # [id number] Informed RN- covid test needed  CSW will continue to follow and assist with discharge planning.  Antony Blackbird, MSW, LCSW Clinical Social Worker    Expected Discharge Plan: Skilled Nursing Facility Barriers to Discharge: Continued Medical Work up  Expected Discharge Plan and Services Expected Discharge Plan: Skilled Nursing Facility In-house Referral: Clinical Social Work     Living arrangements for the past 2 months: Single Family Home                                       Social Determinants of Health (SDOH) Interventions    Readmission Risk Interventions Readmission Risk Prevention Plan 12/12/2019  Transportation Screening Complete  PCP or Specialist Appt within 5-7 Days Not Complete  Not Complete comments plan for SNF  Home Care Screening Complete  Medication Review (RN CM) Referral to Pharmacy  Some recent data might be hidden

## 2021-12-19 NOTE — Care Management Important Message (Signed)
Important Message  Patient Details  Name: Kelly Lee MRN: [id number] Date of Birth: 20-Jan-1951   Medicare Important Message Given:  Yes     Renie Ora 12/19/2021, 10:02 AM

## 2021-12-20 DIAGNOSIS — I70209 Unspecified atherosclerosis of native arteries of extremities, unspecified extremity: Secondary | ICD-10-CM | POA: Diagnosis not present

## 2021-12-20 DIAGNOSIS — I70229 Atherosclerosis of native arteries of extremities with rest pain, unspecified extremity: Secondary | ICD-10-CM | POA: Diagnosis not present

## 2021-12-20 DIAGNOSIS — D649 Anemia, unspecified: Secondary | ICD-10-CM | POA: Diagnosis not present

## 2021-12-20 DIAGNOSIS — I429 Cardiomyopathy, unspecified: Secondary | ICD-10-CM | POA: Diagnosis not present

## 2021-12-20 LAB — GLUCOSE, CAPILLARY
Glucose-Capillary: 84 mg/dL (ref 70–99)
Glucose-Capillary: 109 mg/dL — ABNORMAL HIGH (ref 70–99)
Glucose-Capillary: 149 mg/dL — ABNORMAL HIGH (ref 70–99)
Glucose-Capillary: 188 mg/dL — ABNORMAL HIGH (ref 70–99)

## 2021-12-20 LAB — CBC
HCT: 31.4 % — ABNORMAL LOW (ref 36.0–46.0)
Hemoglobin: 10 g/dL — ABNORMAL LOW (ref 12.0–15.0)
MCH: 29 pg (ref 26.0–34.0)
MCHC: 31.8 g/dL (ref 30.0–36.0)
MCV: 91 fL (ref 80.0–100.0)
Platelets: 325 10*3/uL (ref 150–400)
RBC: 3.45 MIL/uL — ABNORMAL LOW (ref 3.87–5.11)
RDW: 15.3 % (ref 11.5–15.5)
WBC: 11.1 10*3/uL — ABNORMAL HIGH (ref 4.0–10.5)
nRBC: 0.2 % (ref 0.0–0.2)

## 2021-12-20 LAB — SARS CORONAVIRUS 2 (TAT 6-24 HRS): SARS Coronavirus 2: NEGATIVE

## 2021-12-20 NOTE — Progress Notes (Signed)
Occupational Therapy Treatment Patient Details Name: Kelly Lee MRN: [id number] DOB: 01/22/1951 Today's Date: 12/20/2021   History of present illness 71 y.o. female presenting to ED 2/1 with RLE acute critical limb ischemia s/p emergent RLE arterial thrombectomy and R calf four compartment fasciotomy 12/15/21. Admission complicated by post-op hypotension with SPB in 90's. Transferred to ICU 2/2 secondary to hypotension. PMHx significant for DMII, HTN, paroxysmal A-fib, diastolic HF and fall with L hip fx 11/2019.   OT comments  OT treatment session with focus on self-care re-education and bed mobility. Patient completed 2/3 grooming tasks seated EOB and completed lateral scoots to HOB. Patient declined standing, mobility and transfer to recliner. OT will continue to follow acutely. Recommendation for SNF remains appropriate.    Recommendations for follow up therapy are one component of a multi-disciplinary discharge planning process, led by the attending physician.  Recommendations may be updated based on patient status, additional functional criteria and insurance authorization.    Follow Up Recommendations  Skilled nursing-short term rehab (<3 hours/day)    Assistance Recommended at Discharge Frequent or constant Supervision/Assistance  Patient can return home with the following  A little help with walking and/or transfers;A little help with bathing/dressing/bathroom;Assistance with cooking/housework;Assist for transportation   Equipment Recommendations  Other (comment) (repairs to hospital bed)    Recommendations for Other Services      Precautions / Restrictions Precautions Precautions: Fall Precaution Comments: Hx of falls; monitor BP; wound vac Restrictions Weight Bearing Restrictions: Yes RLE Weight Bearing: Weight bearing as tolerated       Mobility Bed Mobility Overal bed mobility: Needs Assistance Bed Mobility: Supine to Sit, Sit to Supine     Supine to sit: Min  guard, HOB elevated Sit to supine: Min guard, HOB elevated   General bed mobility comments: Min guard for supine <> EOB with increased time. Able to advance RLE from bed surfaces <> EOB without external assist.    Transfers Overall transfer level: Needs assistance                 General transfer comment: Politely declined     Balance Overall balance assessment: Needs assistance Sitting-balance support: Feet supported, No upper extremity supported Sitting balance-Leahy Scale: Good                                     ADL either performed or assessed with clinical judgement   ADL Overall ADL's : Needs assistance/impaired     Grooming: Set up;Sitting Grooming Details (indicate cue type and reason): 2/3 grooming tasks seated EOB.             Lower Body Dressing: Moderate assistance;Sit to/from stand Lower Body Dressing Details (indicate cue type and reason): Max A to don R sock. Patient reports inability to attain figure-4 position 2/2 pain in RLE. Noted swelling in RLE at ankle/foot/toes and thigh.               General ADL Comments: Declined standing or transfer to chair. States "I don't feel like it".    Extremity/Trunk Assessment              Vision       Perception     Praxis      Cognition Arousal/Alertness: Lethargic Behavior During Therapy: Anxious Overall Cognitive Status: No family/caregiver present to determine baseline cognitive functioning  General Comments: Patient A&Ox4; slow to respond this afternoon        Exercises      Shoulder Instructions       General Comments Some swelling/edema in toes/foot as well as proximal thigh    Pertinent Vitals/ Pain       Pain Assessment Pain Assessment: Faces Faces Pain Scale: Hurts little more Pain Location: R groin and RLE (surgical) Pain Descriptors / Indicators: Sore, Operative site guarding, Guarding, Grimacing Pain  Intervention(s): Limited activity within patient's tolerance, Monitored during session, Repositioned  Home Living                                          Prior Functioning/Environment              Frequency  Min 2X/week        Progress Toward Goals  OT Goals(current goals can now be found in the care plan section)  Progress towards OT goals: Not progressing toward goals - comment (Declined OOB activtity)  Acute Rehab OT Goals Patient Stated Goal: Patient in agreement with SNF rehab. OT Goal Formulation: With patient Time For Goal Achievement: 12/29/21 Potential to Achieve Goals: Good ADL Goals Pt Will Perform Lower Body Dressing: with supervision;sit to/from stand Pt Will Transfer to Toilet: with supervision;bedside commode;ambulating Pt Will Perform Toileting - Clothing Manipulation and hygiene: with supervision;sit to/from stand Additional ADL Goal #1: Patient will complete sit to stand transfers to RW with supervision A in prep for ADLs.  Plan Discharge plan remains appropriate;Frequency remains appropriate    Co-evaluation                 AM-PAC OT "6 Clicks" Daily Activity     Outcome Measure   Help from another person eating meals?: None Help from another person taking care of personal grooming?: A Little Help from another person toileting, which includes using toliet, bedpan, or urinal?: A Lot Help from another person bathing (including washing, rinsing, drying)?: A Lot Help from another person to put on and taking off regular upper body clothing?: A Little Help from another person to put on and taking off regular lower body clothing?: A Lot 6 Click Score: 16    End of Session    OT Visit Diagnosis: Unsteadiness on feet (R26.81);Other abnormalities of gait and mobility (R26.89);Muscle weakness (generalized) (M62.81);Pain Pain - Right/Left: Right Pain - part of body: Leg   Activity Tolerance Patient tolerated treatment well;Other  (comment) (Declined standing)   Patient Left in bed;with call bell/phone within reach;with bed alarm set   Nurse Communication Mobility status        Time: 1340-1359 OT Time Calculation (min): 19 min  Charges: OT General Charges $OT Visit: 1 Visit OT Evaluation $OT Eval Low Complexity: 1 Low  Starlin Steib H. OTR/L Supplemental OT, Department of rehab services [phone]  Teirra Carapia R H. 12/20/2021, 2:14 PM

## 2021-12-20 NOTE — Progress Notes (Signed)
Consultation Progress Note   Patient: Kelly Lee [id number] DOB: December 26, 1950 DOA: 12/14/2021 DOS: the patient was seen and examined on 12/20/2021 Primary service: Cherre Robins, MD  Brief hospital course: 71 y/o female admitted to the hospital with acute right leg ischemia secondary to arterial thrombus. She was admitted to the hospital under vascular surgery and underwent thrombectomy and 4 compartment fasciotomy. Post operatively, she developed hypotension and anemia requiring transfer to [facility] for closer monitoring. [facility] asked to follow patient for medical management  Assessment and Plan: * Critical lower limb ischemia ([place])- (present on admission) Admitted to vascular surgery service. S/p right lower leg arterial thrombectomy and right four compartment fasciotomy.  She was treated with IV heparin and transition to Eliquis on 2/6.  Unfortunately, anticoagulation had to be held due to development of hematoma at incision site.  Holding further anticoagulation for now until it can be determined that she will not need any further operative management.  Would resume Eliquis when felt reasonable by vascular. Management per vascular surgery service.  Cardiomyopathy ([place]) Noted to have EF of 35-40% on echo from 12/01/2020 Echo repeated on 2/2 and EF noted to be normal Appears to be on GDMT Resuming metoprolol, lasix and aldactone. She is also on jardiance  Anemia- (present on admission) Hemoglobin 11.6 on admission Trended down to 8.1 Possibly hemodilution +/- blood loss during surgery She has been transfused a total of 2 unit prbc thus far Follow up hemoglobin stable at 10 Continue to follow hemoglobin  Type 2 diabetes mellitus, with long-term current use of insulin ([place]) She takes Lantus, metformin and jardiance as outpatient Continued on lantus and SSI since admission Resume outpatient regimen on discharge Adjusting lantus and meal coverage novolog based on blood  sugars -Blood sugars now improved  PAF (paroxysmal atrial fibrillation) ([place])- (present on admission) Chronically on metoprolol 200 mg daily Noted to be sinus bradycardia on admission Dose of metoprolol confirmed with patient's daughter Her CHA2DS2-VASc score of 8 Previous DC summary from [facility] in 11/2020 indicated that she was discharged with eliquis Patient reports that at some point Eliquis was discontinued Her daughter is unaware of any contraindication to anticoagulation She can initially treat with IV heparin since admission and was transitioned to Eliquis on 2/6 Anticoagulation had to be held due to development of hematoma at incision site Restarted on Eliquis when felt appropriate by vascular Heart rate has been stable on metoprolol, although this is a reduced dose We will continue reduced dose until she can follow-up with her primary cardiologist  Morbid obesity with BMI of 40.0-44.9, adult ([place]) Chronic  Hyperthyroidism- (present on admission) She is chronically on methimazole which was continued on admission  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 Cardiac enzymes checked and noted to be negative Echocardiogram shows normal ejection fraction Had some atypical chest pain overnight, now resolved   Chronic respiratory failure with hypoxia ([place])- (present on admission) Appears to chronically be on 2L Resp status currently stable  Hypertension- (present on admission) She is on multiple antihypertensives at home including Toprol, Lasix, Aldactone  These were continued on admission, but patient became hypotensive so they were placed on hold Metoprolol has now been restarted at reduced dose and blood pressure has been stable  Hypotension-resolved as of 12/17/2021           [facility] will continue to follow the patient.  Subjective: Continues to have pain in  her leg, denies  any shortness of breath  Physical Exam: Vitals:   12/20/21 1100 12/20/21 1126 12/20/21 1538 12/20/21 1928  BP: (!) 91/53 (!) 109/51 (!) 112/47 107/60  Pulse:   95 88  Resp:    19  Temp:   98.2 F (36.8 C) 98 F (36.7 C)  TempSrc:   Oral Oral  SpO2:   (!) 89% 98%  Weight:      Height:       General exam: Alert, awake, oriented x 3 Respiratory system: Clear to auscultation. Respiratory effort normal. Cardiovascular system:RRR. No murmurs, rubs, gallops. Gastrointestinal system: Abdomen is nondistended, soft and nontender. No organomegaly or masses felt. Normal bowel sounds heard. Central nervous system: Alert and oriented. No focal neurological deficits. Extremities: Right lower extremity with dressing in place Skin: No rashes, lesions or ulcers Psychiatry: Judgement and insight appear normal. Mood & affect appropriate.    Data Reviewed:  Reviewed CBC  Family Communication: Discussed with patient   Time spent: 35 minutes.  Author: Kathie Dike, MD 12/20/2021 9:54 PM  For on call review [email or web address].

## 2021-12-20 NOTE — Discharge Instructions (Addendum)
Information on my medicine - ELIQUIS (apixaban)  Why was Eliquis prescribed for you? Eliquis was prescribed for you to reduce the risk of a blood clot forming that can cause a stroke if you have a medical condition called atrial fibrillation (a type of irregular heartbeat).  What do You need to know about Eliquis ? Take your Eliquis TWICE DAILY - one tablet in the morning and one tablet in the evening with or without food. If you have difficulty swallowing the tablet whole please discuss with your pharmacist how to take the medication safely.  Take Eliquis exactly as prescribed by your doctor and DO NOT stop taking Eliquis without talking to the doctor who prescribed the medication.  Stopping may increase your risk of developing a stroke.  Refill your prescription before you run out.  After discharge, you should have regular check-up appointments with your healthcare provider that is prescribing your Eliquis.  In the future your dose may need to be changed if your kidney function or weight changes by a significant amount or as you get older.  What do you do if you miss a dose? If you miss a dose, take it as soon as you remember on the same day and resume taking twice daily.  Do not take more than one dose of ELIQUIS at the same time to make up a missed dose.  Important Safety Information A possible side effect of Eliquis is bleeding. You should call your healthcare provider right away if you experience any of the following: Bleeding from an injury or your nose that does not stop. Unusual colored urine (red or dark brown) or unusual colored stools (red or black). Unusual bruising for unknown reasons. A serious fall or if you hit your head (even if there is no bleeding).  Some medicines may interact with Eliquis and might increase your risk of bleeding or clotting while on Eliquis. To help avoid this, consult your healthcare provider or pharmacist prior to using any new prescription or  non-prescription medications, including herbals, vitamins, non-steroidal anti-inflammatory drugs (NSAIDs) and supplements.  This website has more information on Eliquis (apixaban): [email or web address]    [facility]  Discharge instructions  Lower Extremity Bypass Surgery  Please refer to the following instruction for your post-procedure care. Your surgeon or physician assistant will discuss any changes with you.  Activity  You are encouraged to walk as much as you can. You can slowly return to normal activities during the month after your surgery. Avoid strenuous activity and heavy lifting until your doctor tells you it's OK. Avoid activities such as vacuuming or swinging a golf club. Do not drive until your doctor give the OK and you are no longer taking prescription pain medications. It is also normal to have difficulty with sleep habits, eating and bowel movement after surgery. These will go away with time.  Bathing/Showering  Shower daily after you go home. Do not soak in a bathtub, hot tub, or swim until the incision heals completely.  Incision Care  Clean your incision with mild soap and water. Shower every day. Pat the area dry with a clean towel. You do not need a bandage unless otherwise instructed. Do not apply any ointments or creams to your incision. If you have open wounds you will be instructed how to care for them or a visiting nurse may be arranged for you. If you have staples or sutures along your incision they will be removed at your post-op appointment. You  may have skin glue on your incision. Do not peel it off. It will come off on its own in about one week.  Wash the groin wound with soap and water daily and pat dry. (No tub bath-only shower)  Then put a dry gauze or washcloth in the groin to keep this area dry to help prevent wound infection.  This does not need to be taped. Do this daily and  replace as needed.  Do not  use Vaseline or neosporin on your incisions.  Only use soap and water on your incisions and then protect and keep dry.  Wet to dry saline dressing changes to right lateral fasciotomy wound and wrap with kerlix and 4" ace from foot to knee with mild compression.  Diet  Resume your normal diet. There are no special food restrictions following this procedure. A low fat/ low cholesterol diet is recommended for all patients with vascular disease. In order to heal from your surgery, it is CRITICAL to get adequate nutrition. Your body requires vitamins, minerals, and protein. Vegetables are the best source of vitamins and minerals. Vegetables also provide the perfect balance of protein. Processed food has little nutritional value, so try to avoid this.  Medications  Resume taking all your medications unless your doctor or physician assistant tells you not to. If your incision is causing pain, you may take over-the-counter pain relievers such as acetaminophen (Tylenol). If you were prescribed a stronger pain medication, please aware these medication can cause nausea and constipation. Prevent nausea by taking the medication with a snack or meal. Avoid constipation by drinking plenty of fluids and eating foods with high amount of fiber, such as fruits, vegetables, and grains. Take Colace 100 mg (an over-the-counter stool softener) twice a day as needed for constipation.  Do not take Tylenol if you are taking prescription pain medications.  Follow Up  Our office will schedule a follow up appointment 2-3 weeks following discharge.  Please call us immediately for any of the following conditions  Severe or worsening pain in your legs or feet while at rest or while walking Increase pain, redness, warmth, or drainage (pus) from your incision site(s) Fever of 101 degree or higher The swelling in your leg with the bypass suddenly worsens and becomes more painful than when you were in the hospital If you have  been instructed to feel your graft pulse then you should do so every day. If you can no longer feel this pulse, call the office immediately. Not all patients are given this instruction.  Leg swelling is common after leg bypass surgery.  The swelling should improve over a few months following surgery. To improve the swelling, you may elevate your legs above the level of your heart while you are sitting or resting. Your surgeon or physician assistant may ask you to apply an ACE wrap or wear compression (TED) stockings to help to reduce swelling.  Reduce your risk of vascular disease  Stop smoking. If you would like help call [facility] at 1-800-QUIT-NOW (416)449-6863) or [place] at 602-130-4333.  Manage your cholesterol Maintain a desired weight Control your diabetes weight Control your diabetes Keep your blood pressure down  If you have any questions, please call the office at [phone]

## 2021-12-20 NOTE — Progress Notes (Signed)
Physical Therapy Treatment Patient Details Name: Kelly Lee MRN: [id number] DOB: 03/28/1951 Today's Date: 12/20/2021   History of Present Illness 71 y.o. female presenting to ED 2/1 with RLE acute critical limb ischemia s/p emergent RLE arterial thrombectomy and R calf four compartment fasciotomy 12/15/21. Admission complicated by post-op hypotension with SPB in 90's. Transferred to ICU 2/2 secondary to hypotension. PMHx significant for DMII, HTN, paroxysmal A-fib, diastolic HF and fall with L hip fx 11/2019.    PT Comments    Patient lethargic during session today. Requires repetition to follow simple commands and constant stimulus to stay attended to task. Requires Mod A to stand from EOB but limited standing tolerance today due to pain in RLE with weight bearing. Declined transfer to chair. Instructed pt in elevating RLE and ankle AROM/stretching. Continues to be appropriate for SNF. Will follow.   Recommendations for follow up therapy are one component of a multi-disciplinary discharge planning process, led by the attending physician.  Recommendations may be updated based on patient status, additional functional criteria and insurance authorization.  Follow Up Recommendations  Skilled nursing-short term rehab (<3 hours/day)     Assistance Recommended at Discharge Frequent or constant Supervision/Assistance  Patient can return home with the following A lot of help with bathing/dressing/bathroom;Assistance with cooking/housework;Assist for transportation;Help with stairs or ramp for entrance;A little help with walking and/or transfers;A little help with bathing/dressing/bathroom   Equipment Recommendations  None recommended by PT    Recommendations for Other Services       Precautions / Restrictions Precautions Precautions: Fall Precaution Comments: Hx of falls; monitor BP; wound vac Restrictions Weight Bearing Restrictions: Yes RLE Weight Bearing: Weight bearing as tolerated      Mobility  Bed Mobility Overal bed mobility: Needs Assistance Bed Mobility: Supine to Sit, Sit to Supine     Supine to sit: Min assist, HOB elevated Sit to supine: Min guard, HOB elevated   General bed mobility comments: Min A for trunk and use of rails.    Transfers Overall transfer level: Needs assistance Equipment used: Rolling walker (2 wheels) Transfers: Sit to/from Stand Sit to Stand: Mod assist           General transfer comment: MOd A to power to standing, limited tolerance due to pain with flexed trunk. Declined transfer to chair.    Ambulation/Gait               General Gait Details: Unable today due to pain   Stairs             Wheelchair Mobility    Modified Rankin (Stroke Patients Only)       Balance Overall balance assessment: Needs assistance Sitting-balance support: Feet supported, No upper extremity supported Sitting balance-Leahy Scale: Good Sitting balance - Comments: Laterally scooting along side bed x5-6 without assist.   Standing balance support: During functional activity, Reliant on assistive device for balance Standing balance-Leahy Scale: Poor Standing balance comment: Limited tolerance due to pain.                            Cognition Arousal/Alertness: Lethargic Behavior During Therapy: Anxious Overall Cognitive Status: No family/caregiver present to determine baseline cognitive functioning                                 General Comments: Pt with slow processing and would "daze out" without verbal stimulus during  commands or conversation. Impaired attention, arousal and ability to follow commands wihtout repetition and constant stimulus.        Exercises      General Comments General comments (skin integrity, edema, etc.): Some swelling/edema in toes/foot as well as proximal thigh      Pertinent Vitals/Pain Pain Assessment Pain Assessment: Faces Faces Pain Scale: Hurts whole  lot Pain Location: R groin and RLE (surgical) Pain Descriptors / Indicators: Sore, Operative site guarding, Guarding, Grimacing Pain Intervention(s): Monitored during session, Limited activity within patient's tolerance, Repositioned    Home Living                          Prior Function            PT Goals (current goals can now be found in the care plan section) Progress towards PT goals: Not progressing toward goals - comment (pain and arousal)    Frequency    Min 3X/week      PT Plan Current plan remains appropriate    Co-evaluation              AM-PAC PT "6 Clicks" Mobility   Outcome Measure  Help needed turning from your back to your side while in a flat bed without using bedrails?: A Little Help needed moving from lying on your back to sitting on the side of a flat bed without using bedrails?: A Little Help needed moving to and from a bed to a chair (including a wheelchair)?: A Lot Help needed standing up from a chair using your arms (e.g., wheelchair or bedside chair)?: A Lot Help needed to walk in hospital room?: A Little Help needed climbing 3-5 steps with a railing? : A Lot 6 Click Score: 15    End of Session Equipment Utilized During Treatment: Gait belt Activity Tolerance: Patient limited by pain;Patient limited by lethargy Patient left: in bed;with call bell/phone within reach;with bed alarm set Nurse Communication: Mobility status PT Visit Diagnosis: History of falling (Z91.81);Muscle weakness (generalized) (M62.81);Difficulty in walking, not elsewhere classified (R26.2);Pain Pain - Right/Left: Right Pain - part of body: Leg     Time: [id number] PT Time Calculation (min) (ACUTE ONLY): 20 min  Charges:  $Therapeutic Activity: 8-22 mins                     Marisa Severin, PT, DPT Acute Rehabilitation Services Pager [phone] Office [place] 12/20/2021, 12:18 PM

## 2021-12-20 NOTE — Progress Notes (Signed)
Checked on pt earlier today and right leg dressing changed once by RN due to drainage.  I checked in on her earlier and dressing remained dry.  I was called to check hematoma in right groin medial to Prevena vac.  Small hematoma present medially.  Will leave Prevena and check CBC in the am.     Doreatha Massed, [facility] 12/20/2021 3:01 PM

## 2021-12-20 NOTE — Progress Notes (Addendum)
°  Progress Note    12/20/2021 6:56 AM 6 Days Post-Op  Subjective:  says her leg is sore  Tm 99.1 HR 90's-100's  110's-120's systolic 97% RA  Vitals:   12/19/21 2331 12/20/21 0453  BP: (!) 120/52 116/85  Pulse: 100 98  Resp: 16 15  Temp: 98.9 F (37.2 C) 99.1 F (37.3 C)  SpO2: 97% 97%    Physical Exam: Cardiac:  irregular Lungs:  non labored Incisions:  right groin with Prevena  Lateral incision  Medial incision   Extremities:  right foot is warm and well perfused with motor and sensory in tact.   CBC    Component Value Date/Time   WBC 11.1 (H) 12/20/2021 0039   RBC 3.45 (L) 12/20/2021 0039   HGB 10.0 (L) 12/20/2021 0039   HCT 31.4 (L) 12/20/2021 0039   PLT 325 12/20/2021 0039   MCV 91.0 12/20/2021 0039   MCH 29.0 12/20/2021 0039   MCHC 31.8 12/20/2021 0039   RDW 15.3 12/20/2021 0039   LYMPHSABS 2.2 12/14/2021 1920   MONOABS 0.6 12/14/2021 1920   EOSABS 0.2 12/14/2021 1920   BASOSABS 0.1 12/14/2021 1920    BMET    Component Value Date/Time   NA 135 12/18/2021 0211   K 4.3 12/18/2021 0211   CL 99 12/18/2021 0211   CO2 29 12/18/2021 0211   GLUCOSE 125 (H) 12/18/2021 0211   BUN 9 12/18/2021 0211   CREATININE 0.84 12/18/2021 0211   CALCIUM 8.5 (L) 12/18/2021 0211   GFRNONAA >60 12/18/2021 0211   GFRAA >60 12/17/2019 0347    INR No results found for: INR   Intake/Output Summary (Last 24 hours) at 12/20/2021 0656 Last data filed at 12/20/2021 0454 Gross per 24 hour  Intake 240 ml  Output 2251 ml  Net -2011 ml     Assessment/Plan:  71 y.o. female is s/p:  RLE thrombectomy and 4 compartment fasciotomies   6 Days Post-Op   -right foot is warm and perfused.   -lateral fasciotomy site with darkened skin and blistering around incision.  Discussed with Dr. Lenell Antu and will remove a couple of staples today.  Not medically ready for discharge currently -[facility] confirmed bed offer and can admit today if insurance authorization approved.   (Covid test -) -DVT prophylaxis:  transition heparin gtt to Eliquis at discharge.    Doreatha Massed, PA-C [facility] [phone] 12/20/2021 6:56 AM  Addendum:  Cherlynn Polo removed from lateral fasciotomy incision.  Hematoma present.  Will place wet to dry dressing today and possibly vac tomorrow.   Doreatha Massed, [facility] 12/20/2021 8:12 AM   VASCULAR STAFF ADDENDUM: I have independently interviewed and examined the patient. I agree with the above.  Hematoma in lateral fasciotomy site. Painful. Will allow to drain today with wet-to-dry dressing over top. VAC tomorrow.  NPO after midnight in case she needs washout.   Rande Brunt. Lenell Antu, MD [facility] Phone Number: [phone] 12/20/2021 8:44 AM

## 2021-12-20 NOTE — Progress Notes (Signed)
Hematoma present on R groin. Notified PA. Will continue to monitor.  Era Bumpers, RN

## 2021-12-20 NOTE — Progress Notes (Signed)
PT Cancellation Note  Patient Details Name: Kelly Lee MRN: [id number] DOB: 10-Sep-1951   Cancelled Treatment:    Reason Eval/Treat Not Completed: Other (comment) RN requesting PT hold as she just had her staples removed. Will follow.   Marguarite Arbour A Hennie Gosa 12/20/2021, 8:13 AM Marisa Severin, PT, DPT Acute Rehabilitation Services Pager [phone] Office [phone]

## 2021-12-21 ENCOUNTER — Emergency Department (HOSPITAL_COMMUNITY): Payer: Medicare Other

## 2021-12-21 ENCOUNTER — Other Ambulatory Visit: Payer: Self-pay

## 2021-12-21 ENCOUNTER — Encounter (HOSPITAL_COMMUNITY): Payer: Self-pay | Admitting: Emergency Medicine

## 2021-12-21 ENCOUNTER — Emergency Department (HOSPITAL_COMMUNITY)
Admission: EM | Admit: 2021-12-21 | Discharge: 2021-12-21 | Disposition: A | Discharge status: Skilled Nursing Facility | Payer: Medicare Other | Attending: Emergency Medicine | Admitting: Emergency Medicine

## 2021-12-21 DIAGNOSIS — R1084 Generalized abdominal pain: Secondary | ICD-10-CM | POA: Diagnosis not present

## 2021-12-21 DIAGNOSIS — Z7901 Long term (current) use of anticoagulants: Secondary | ICD-10-CM | POA: Diagnosis not present

## 2021-12-21 DIAGNOSIS — I70229 Atherosclerosis of native arteries of extremities with rest pain, unspecified extremity: Secondary | ICD-10-CM | POA: Diagnosis not present

## 2021-12-21 DIAGNOSIS — Z79899 Other long term (current) drug therapy: Secondary | ICD-10-CM | POA: Diagnosis not present

## 2021-12-21 DIAGNOSIS — K5900 Constipation, unspecified: Secondary | ICD-10-CM | POA: Insufficient documentation

## 2021-12-21 DIAGNOSIS — I429 Cardiomyopathy, unspecified: Secondary | ICD-10-CM | POA: Diagnosis not present

## 2021-12-21 DIAGNOSIS — I480 Paroxysmal atrial fibrillation: Secondary | ICD-10-CM | POA: Diagnosis not present

## 2021-12-21 DIAGNOSIS — I10 Essential (primary) hypertension: Secondary | ICD-10-CM | POA: Diagnosis not present

## 2021-12-21 DIAGNOSIS — L8990 Pressure ulcer of unspecified site, unspecified stage: Secondary | ICD-10-CM | POA: Diagnosis present

## 2021-12-21 LAB — CBC
HCT: 31.3 % — ABNORMAL LOW (ref 36.0–46.0)
Hemoglobin: 9.9 g/dL — ABNORMAL LOW (ref 12.0–15.0)
MCH: 28.7 pg (ref 26.0–34.0)
MCHC: 31.6 g/dL (ref 30.0–36.0)
MCV: 90.7 fL (ref 80.0–100.0)
Platelets: 364 10*3/uL (ref 150–400)
RBC: 3.45 MIL/uL — ABNORMAL LOW (ref 3.87–5.11)
RDW: 15.5 % (ref 11.5–15.5)
WBC: 8.8 10*3/uL (ref 4.0–10.5)
nRBC: 0.2 % (ref 0.0–0.2)

## 2021-12-21 LAB — GLUCOSE, CAPILLARY
Glucose-Capillary: 150 mg/dL — ABNORMAL HIGH (ref 70–99)
Glucose-Capillary: 111 mg/dL — ABNORMAL HIGH (ref 70–99)

## 2021-12-21 MED ORDER — POLYETHYLENE GLYCOL 3350 17 G PO PACK
17.0000 g | PACK | Freq: Every day | ORAL | 0 refills | Status: AC
Start: 2021-12-21 — End: ?

## 2021-12-21 MED ORDER — APIXABAN 5 MG PO TABS
5.0000 mg | ORAL_TABLET | Freq: Two times a day (BID) | ORAL | Status: DC
  Administered 2021-12-21: 5 mg via ORAL
  Filled 2021-12-21: qty 1

## 2021-12-21 MED ORDER — MAGNESIUM CITRATE PO SOLN
1.0000 | Freq: Once | ORAL | 0 refills | Status: AC
Start: 2021-12-21 — End: 2021-12-21

## 2021-12-21 MED ORDER — OXYCODONE-ACETAMINOPHEN 5-325 MG PO TABS: 1.0000 | ORAL_TABLET | Freq: Four times a day (QID) | ORAL | 0 refills | Status: DC | PRN

## 2021-12-21 NOTE — ED Triage Notes (Signed)
Pt here with ptar was discharged from [facility]  to a nursing home , the nursing home refused the pt so pt brought back to the ED for re eval and placement

## 2021-12-21 NOTE — Discharge Instructions (Addendum)
It was a pleasure taking care of you today!   Your abdomen xray was notable for a large volume of stool. It is important to maintain hydration and ensure to maintain fluid intake.  Ensure to eat fiber rich foods.  Take the magnesium citrate starting today as prescribed. Start the MiraLAX starting tomorrow as prescribed for 2 weeks.  You may follow-up with your primary care provider as needed.  Return to the emergency department if you are experiencing increasing/worsening constipation, unable to maintain fluid intake, fever, or worsening symptoms.

## 2021-12-21 NOTE — ED Provider Notes (Signed)
[facility] EMERGENCY DEPARTMENT Provider Note   CSN: [id number] Arrival date & time: 12/21/21  1659     History  No chief complaint on file.   Kelly Lee is a 71 y.o. female who presents to the ED BIB EMS complaining of constipation onset 6 days. She was discharged from the hospital today and was being transported to [facility], however, upon arrival she was sent to the ED for further evaluation.  Patient notes associated right-sided abdominal pain.  Has associated constipation with her last bowel movement being 6 days ago.  Patient does not wear oxygen at baseline.  She notes that she was placed on oxygen while in route to the SNF, she notes that it was hot in the back of the ambulance and she was taking more shallow breaths because of the heat and the sensor read it as her being hypoxic. Pt doesn't wear oxygen at baseline. Has not tried any medications for her symptoms.  Denies chest pain, shortness of breath, fever, chills, nausea, vomiting.   Per patient chart review: Patient was discharged from the hospital today [facility] [place].  Upon arrival, facility noted that patient needed to be evaluated and sent back to the ED due to constipation and being on oxygen.  The history is provided by the patient. No language interpreter was used.      Home Medications Prior to Admission medications   Medication Sig Start Date End Date Taking? Authorizing Provider  magnesium citrate SOLN Take 296 mLs (1 Bottle total) by mouth once for 1 dose. 12/21/21 12/21/21 Yes Nikala Walsworth A, PA-C  polyethylene glycol (MIRALAX / GLYCOLAX) 17 g packet Take 17 g by mouth daily. 12/21/21  Yes Chan Rosasco A, PA-C  albuterol (VENTOLIN HFA) 108 (90 Base) MCG/ACT inhaler Inhale 2 puffs into the lungs every 6 (six) hours as needed for wheezing or shortness of breath. 12/06/21   [provider]  apixaban (ELIQUIS) 5 MG TABS  tablet Take 1 tablet (5 mg total) by mouth 2 (two) times daily. 12/19/21   Kathie Dike, MD  clopidogrel (PLAVIX) 75 MG tablet Take 75 mg by mouth daily.    [provider]  empagliflozin (JARDIANCE) 10 MG TABS tablet Take 10 mg by mouth daily.    [provider]  ezetimibe (ZETIA) 10 MG tablet Take 10 mg by mouth daily. 11/16/19   [provider]  fluticasone (FLONASE) 50 MCG/ACT nasal spray Place 1 spray into both nostrils daily as needed for allergies. 06/25/20   [provider]  furosemide (LASIX) 40 MG tablet Take 20 mg by mouth daily.  10/17/19   [provider]  LANTUS SOLOSTAR 100 UNIT/ML Solostar Pen Inject 40 Units into the skin at bedtime. 12/19/21   Kathie Dike, MD  metFORMIN (GLUCOPHAGE) 500 MG tablet Take 500 mg by mouth 3 (three) times daily. 11/16/19   [provider]  methimazole (TAPAZOLE) 5 MG tablet Take 5 mg by mouth daily. 10/15/21   [provider]  metoprolol succinate (TOPROL-XL) 25 MG 24 hr tablet Take 1 tablet (25 mg total) by mouth 2 (two) times daily. 12/20/21   Kathie Dike, MD  MYRBETRIQ 50 MG TB24 tablet Take 50 mg by mouth daily. 10/09/19   [provider]  nitroGLYCERIN (NITROSTAT) 0.4 MG SL tablet Place 0.4 mg under the tongue every 5 (five) minutes as needed for chest pain.     [provider]  nystatin (  MYCOSTATIN/NYSTOP) powder Apply 1 application topically 3 (three) times daily as needed (rash).    [provider]  oxyCODONE-acetaminophen (PERCOCET) 5-325 MG tablet Take 1 tablet by mouth every 6 (six) hours as needed. 12/21/21   Rhyne, Hulen Shouts, PA-C  pantoprazole (PROTONIX) 40 MG tablet Take 40 mg by mouth daily. 10/17/19   [provider]  rosuvastatin (CRESTOR) 20 MG tablet Take 1 tablet (20 mg total) by mouth at bedtime. 12/20/21   Kathie Dike, MD  spironolactone (ALDACTONE) 25 MG tablet Take 12.5 mg by mouth daily. 10/05/21   [provider]   tiZANidine (ZANAFLEX) 4 MG tablet Take 4 mg by mouth 3 (three) times daily as needed for muscle spasms.  11/13/19   [provider]      Allergies    Penicillins, Azithromycin, and Drug ingredient [cephalexin]    Review of Systems   Review of Systems  Constitutional:  Negative for chills and fever.  Respiratory:  Negative for shortness of breath.   Cardiovascular:  Negative for chest pain.  Gastrointestinal:  Positive for abdominal pain and constipation. Negative for nausea and vomiting.  All other systems reviewed and are negative.  Physical Exam Updated Vital Signs BP 124/65    Pulse 99    Temp 98.1 F (36.7 C) (Oral)    Resp 18    SpO2 100%  Physical Exam Vitals and nursing note reviewed.  Constitutional:      General: She is not in acute distress.    Appearance: She is not diaphoretic.  HENT:     Head: Normocephalic and atraumatic.     Mouth/Throat:     Pharynx: No oropharyngeal exudate.  Eyes:     General: No scleral icterus.    Conjunctiva/sclera: Conjunctivae normal.  Cardiovascular:     Rate and Rhythm: Normal rate and regular rhythm.     Pulses: Normal pulses.     Heart sounds: Normal heart sounds.  Pulmonary:     Effort: Pulmonary effort is normal. No respiratory distress.     Breath sounds: Normal breath sounds. No wheezing.  Abdominal:     General: Bowel sounds are normal.     Palpations: Abdomen is soft. There is no mass.     Tenderness: There is generalized abdominal tenderness. There is no guarding or rebound.     Comments: Mild diffuse abdominal tenderness to palpation.   Musculoskeletal:        General: Normal range of motion.     Cervical back: Normal range of motion and neck supple.     Comments: Healing surgical site noted to lateral right lower extremity without surrounding erythema or purulent drainage.  Staples in place to medial right lower extremity without surrounding erythema or purulent drainage.  Skin:    General: Skin is warm and  dry.  Neurological:     Mental Status: She is alert.  Psychiatric:        Behavior: Behavior normal.    ED Results / Procedures / Treatments   Labs (all labs ordered are listed, but only abnormal results are displayed) Labs Reviewed - No data to display  EKG None  Radiology DG Abdomen 1 View  Result Date: 12/21/2021 CLINICAL DATA:  Constipation. EXAM: ABDOMEN - 1 VIEW COMPARISON:  None. FINDINGS: No evidence of dilated bowel loops. Large stool burden is seen. Right upper quadrant surgical clips noted from prior cholecystectomy. Skin staples are also seen in right groin area. IMPRESSION: No acute findings. Large stool burden noted. Electronically Signed  By: Marlaine Hind M.D.   On: 12/21/2021 18:21    Procedures Procedures    Medications Ordered in ED Medications - No data to display  ED Course/ Medical Decision Making/ A&P Clinical Course as of 12/21/21 2111  Wed Dec 21, 2021  1725 Spoke with Mariann Laster at bedside regarding patient presentation to the ED. Mariann Laster notes that she will determine what occurred with her dismissal from the SNF today.  [SB]  1732 Case discussed with Attending who agrees with KUB at this time and follow up with Mariann Laster, [place] [SB]  1742 Notified by Mariann Laster that [facility] in [facility] will take the patient back if her vital signs are good and the KUB does not have any acute abnormalities.   [SB]  [id number] Discussed with patient abdominal x-ray findings, discussed will print prescriptions for magnesium citrate and MiraLAX to aid with large stool burden.  Discussed with patient that we will get her going back to the nursing facility, patient agreeable at this time.  Vital signs stable, patient not hypotensive while in the ED.  Patient appears safe for discharge.  [SB]    Clinical Course User Index [SB] Elim Economou A, PA-C                           Medical Decision Making Amount and/or Complexity of Data Reviewed Radiology:  ordered.  Risk OTC drugs.   Patient presents to the ED [facility]  She was discharged from the hospital today and transferred to a nursing facility after having right leg arterial thrombectomy completed on 12/14/2021. Upon arriving to the nursing facility, patient was initially hypotensive, due to this the nursing facility instructed that patient need to be sent back to the ED for further evaluation.  Nursing facility also noted patient with constipation and requested her to be evaluated with a KUB.  Patiently initially arrived to the ED on 2 L via nasal cannula.  Vital signs stable, patient afebrile, not tachycardic, oxygen saturation at 100%.  Blood pressure 124/65.  Denies chest pain, shortness of breath.  Last bowel movement 6 days ago.  Case discussed with attending who agrees with KUB, vital check and patient sent back to nursing facility.  Differential diagnosis includes constipation, SBO, fecal impaction.   Imaging: I ordered imaging studies including KUB I independently visualized and interpreted imaging which showed: No acute findings. Large stool burden noted. I agree with the radiologist interpretation   Disposition: Patient presentation suspicious for constipation.  Doubt  SBO or fecal impaction at this time.  Patient without hypotension while in the ED.  Oxygen saturation maintained between 97-100% while in the ED on room air.  After consideration of the diagnostic results and the patients response to treatment, I feel that the patient would benefit from discharge [facility] in [place] [place].  Case discussed with attending who agrees with discharge back to nursing facility. Supportive care measures and strict return precautions discussed with patient at bedside. Pt acknowledges and verbalizes understanding. Pt appears safe for discharge. Follow up as indicated in discharge paperwork.     This chart was dictated using voice recognition software, Dragon. Despite the best efforts of this provider to proofread and correct errors, errors may still occur which can change documentation meaning.    Final Clinical Impression(s) / ED Diagnoses Final diagnoses:  Constipation, unspecified constipation type  Rx / DC Orders ED Discharge Orders          Ordered    polyethylene glycol (MIRALAX / GLYCOLAX) 17 g packet  Daily        12/21/21 1829    magnesium citrate SOLN   Once        12/21/21 1829              Zakaria Fromer A, PA-C 12/21/21 2112    Wyvonnia Dusky, MD 12/21/21 2258

## 2021-12-21 NOTE — Discharge Summary (Addendum)
Discharge Summary     Kelly Lee Dec 13, 1950 71 y.o. female  [id number]  Admission Date: 12/14/2021  Discharge Date: 12/21/2021  Physician: Leonie Douglas, MD  Admission Diagnosis: Critical lower limb ischemia [facility]) [I70.229]  HPI:   This is a 71 y.o. female who presents to [facility] ER for evaluation of a 2 hour history of severe right leg pain, weakness, and numbness. The patient has never had symptoms like this before. 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 Ardine Eng 06-03-22 who recommended observation). She was admitted about a year ago with severe COVID pneumonia. She was previously on Eliquis for paroxysmal AFIB (Feb 2021), but does not appear to be taking this now and was not taking this when she saw Dr. Ardine Eng.   Hospital Course:  The patient was admitted to the hospital and taken to the operating room on 12/14/2021 and underwent: 1) right lower extremity arterial thrombectomy 2) right calf four compartment fasciotomy    Findings: Acute thrombus retrieved from right profunda and superficial femoral artery.  Heavily scarred groin consistent with prior cardiac catheterizations.  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  Final pathology:   A. BLOOD CLOT, RIGHT LEG:  - Organizing thrombus   The pt tolerated the procedure well and was transported to the PACU in good condition.   By POD 1, pt hypotensive and has some dizziness with sitting on side of the bed.  Otherwise, she is asymptomatic.  Hgb this am was 8.8.  given hypotension and moderate bloody ooze with dressing change this am, discussed with Dr. Lenell Antu and will transfuse one unit PRBC at this time.  Will get stat CBC and T&S and she will be transferred to the unit.   -heparin gtt on hold until  today at 1500.  By POD 3, she was improving and she was  transferred to the stepdown unit.   She did develop some fullness in the lateral fasciotomy site.  On 2/7, the staples were removed from this area opening up the incision and there was old hematoma present. Wet to dry dressing p laced allowing the wound to drain.   On 2/8, the hematoma was evacuated at bedside.  She will have a wet to dry dressing placed daily.  We will see her in our office on 2/14 and most likely will have facility place wound vac at that time.  Her Prevena vac is also removed from the right groin.   She is discharged to SNF.  She is to continue taking her Eliquis bid.   2D Echocardiogram on 12/15/2021: 1. Left ventricular ejection fraction, by estimation, is 55 to 60%. The  left ventricle has normal function. The left ventricle has no regional  wall motion abnormalities. The left ventricular internal cavity size was  moderately dilated. Left ventricular  diastolic parameters are indeterminate.   2. RV free wall difficult to see even with Definity.. Right ventricular  systolic function is moderately reduced. The right ventricular size is  mildly enlarged.   3. Left atrial size was moderately dilated.   4. Right atrial size was mildly dilated.   5. Mild mitral valve regurgitation.   6. The aortic valve is tricuspid. Aortic valve regurgitation is not  visualized.   7. The inferior vena cava is normal in size with greater than 50%  respiratory variability, suggesting right atrial pressure of 3 mmHg.   CBC  Component Value Date/Time   WBC 8.8 12/21/2021 0342   RBC 3.45 (L) 12/21/2021 0342   HGB 9.9 (L) 12/21/2021 0342   HCT 31.3 (L) 12/21/2021 0342   PLT 364 12/21/2021 0342   MCV 90.7 12/21/2021 0342   MCH 28.7 12/21/2021 0342   MCHC 31.6 12/21/2021 0342   RDW 15.5 12/21/2021 0342   LYMPHSABS 2.2 12/14/2021 1920   MONOABS 0.6 12/14/2021 1920   EOSABS 0.2 12/14/2021 1920   BASOSABS 0.1 12/14/2021 1920    BMET    Component Value Date/Time   NA 135 12/18/2021  0211   K 4.3 12/18/2021 0211   CL 99 12/18/2021 0211   CO2 29 12/18/2021 0211   GLUCOSE 125 (H) 12/18/2021 0211   BUN 9 12/18/2021 0211   CREATININE 0.84 12/18/2021 0211   CALCIUM 8.5 (L) 12/18/2021 0211   GFRNONAA >60 12/18/2021 0211   GFRAA >60 12/17/2019 0347     Discharge Instructions     Call MD for:  redness, tenderness, or signs of infection (pain, swelling, bleeding, redness, odor or green/yellow discharge around incision site)   Complete by: As directed    Call MD for:  severe or increased pain, loss or decreased feeling  in affected limb(s)   Complete by: As directed    Call MD for:  temperature >100.5   Complete by: As directed    Discharge instructions   Complete by: As directed    Wash the groin wound with soap and water daily and pat dry. (No tub bath-only shower)  Then put a dry gauze or washcloth there to keep this area dry daily and as needed.  Do not use Vaseline or neosporin on your incisions.  Only use soap and water on your incisions and then protect and keep dry.   Discharge patient   Complete by: As directed    Discharge disposition: 03-Skilled Nursing Facility   Discharge patient date: 12/21/2021   Discharge wound care:   Complete by: As directed    Wet to dry saline dressing changes to right lateral fasciotomy wound daily.   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 02/16/2018   Morbid obesity with BMI of 40.0-44.9, adult (HCC) 02/16/2018   Typical atrial flutter (HCC) 02/15/2018   Hyperthyroidism 01/10/2018   Type 2 diabetes mellitus, with long-term current use of insulin (HCC) 01/09/2018   Past Medical History:  Diagnosis Date   Closed intertrochanteric fracture, left, initial encounter (HCC) 12/08/2019   Diabetes mellitus without complication (HCC)    Hypertension      Allergies as of 12/21/2021       Reactions   Penicillins Hives, Shortness Of Breath   Azithromycin Hives   Drug Ingredient [cephalexin] Hives        Medication List     STOP taking these medications    pravastatin 40 MG tablet Commonly known as:  PRAVACHOL       TAKE these medications    albuterol 108 (90 Base) MCG/ACT inhaler Commonly known as: VENTOLIN HFA Inhale 2 puffs into the lungs every 6 (six) hours as needed for wheezing or shortness of breath.   apixaban 5 MG Tabs tablet Commonly known as: ELIQUIS Take 1 tablet (5 mg total) by mouth 2 (two) times daily.   clopidogrel 75 MG tablet Commonly known as: PLAVIX Take 75 mg by mouth daily.   empagliflozin 10 MG Tabs tablet Commonly known as: JARDIANCE Take 10 mg by mouth daily.   ezetimibe 10 MG tablet Commonly known as: ZETIA Take 10 mg by mouth daily.   fluticasone 50 MCG/ACT nasal spray Commonly known as: FLONASE Place 1 spray into both nostrils daily as needed for allergies.   furosemide 40 MG tablet Commonly known as: LASIX Take 20 mg by mouth daily.   Lantus SoloStar 100 UNIT/ML Solostar Pen Generic drug: insulin glargine Inject 40 Units into the skin at bedtime. What changed: how much to take   metFORMIN 500 MG tablet Commonly known as: GLUCOPHAGE Take 500 mg by mouth 3 (three) times daily.   methimazole 5 MG tablet Commonly known as: TAPAZOLE Take 5 mg by mouth daily.   metoprolol succinate 25 MG 24 hr tablet Commonly  known as: TOPROL-XL Take 1 tablet (25 mg total) by mouth 2 (two) times daily. What changed:  medication strength how much to take when to take this   Myrbetriq 50 MG Tb24 tablet Generic drug: mirabegron ER Take 50 mg by mouth daily.   nitroGLYCERIN 0.4 MG SL tablet Commonly known as: NITROSTAT Place 0.4 mg under the tongue every 5 (five) minutes as needed for chest pain.   nystatin powder Commonly known as: MYCOSTATIN/NYSTOP Apply 1 application topically 3 (three) times daily as needed (rash).   oxyCODONE-acetaminophen 5-325 MG tablet Commonly known as: Percocet Take 1 tablet by mouth every 6 (six) hours as needed.   pantoprazole 40 MG tablet Commonly known as: PROTONIX Take 40 mg by mouth daily.   rosuvastatin 20 MG tablet Commonly known as: CRESTOR Take 1 tablet (20 mg total) by mouth at bedtime.   spironolactone 25 MG tablet Commonly known as: ALDACTONE Take 12.5 mg by mouth daily.   tiZANidine 4 MG tablet Commonly known as: ZANAFLEX Take 4 mg by mouth 3 (three) times daily as needed for muscle spasms.               Discharge Care Instructions  (From admission, onward)           Start     Ordered   12/21/21 0000  Discharge wound care:       Comments: Wet to dry saline dressing changes to right lateral fasciotomy wound daily.   12/21/21 0917            Discharge Instructions: Information on my medicine - ELIQUIS (apixaban)  Why was Eliquis prescribed for you? Eliquis was prescribed for you to reduce the risk of a blood clot forming that can cause a stroke if you have a medical condition called atrial fibrillation (a type of irregular heartbeat).  What do You need to know about Eliquis ? Take your Eliquis TWICE DAILY - one tablet in the morning and one tablet in the evening with or without food. If you have difficulty swallowing the tablet whole please discuss with your pharmacist how to take the medication safely.  Take Eliquis exactly  as prescribed by your doctor and  DO NOT stop taking Eliquis without talking to the doctor who prescribed the medication.  Stopping may increase your risk of developing a stroke.  Refill your prescription before you run out.  After discharge, you should have regular check-up appointments with your healthcare provider that is prescribing your Eliquis.  In the future your dose may need to be changed if your kidney function or weight changes by a significant amount or as you get older.  What do you do if you miss a dose? If you miss a dose, take it as soon as you remember on the same day and resume taking twice daily.  Do not take more than one dose of ELIQUIS at the same time to make up a missed dose.  Important Safety Information A possible side effect of Eliquis is bleeding. You should call your healthcare provider right away if you experience any of the following: Bleeding from an injury or your nose that does not stop. Unusual colored urine (red or dark brown) or unusual colored stools (red or black). Unusual bruising for unknown reasons. A serious fall or if you hit your head (even if there is no bleeding).  Some medicines may interact with Eliquis and might increase your risk of bleeding or clotting while on Eliquis. To help avoid this, consult your healthcare provider or pharmacist prior to using any new prescription or non-prescription medications, including herbals, vitamins, non-steroidal anti-inflammatory drugs (NSAIDs) and supplements.  This website has more information on Eliquis (apixaban): http://www.eliquis.com/eliquis/home    [facility]  Discharge instructions  Lower Extremity Bypass Surgery  Please refer to the following instruction for your post-procedure care. Your surgeon or physician assistant will discuss any changes with you.  Activity  You are encouraged to walk as much as you can. You can slowly return to normal activities  during the month after your surgery. Avoid strenuous activity and heavy lifting until your doctor tells you it's OK. Avoid activities such as vacuuming or swinging a golf club. Do not drive until your doctor give the OK and you are no longer taking prescription pain medications. It is also normal to have difficulty with sleep habits, eating and bowel movement after surgery. These will go away with time.  Bathing/Showering  Shower daily after you go home. Do not soak in a bathtub, hot tub, or swim until the incision heals completely.  Incision Care  Clean your incision with mild soap and water. Shower every day. Pat the area dry with a clean towel. You do not need a bandage unless otherwise instructed. Do not apply any ointments or creams to your incision. If you have open wounds you will be instructed how to care for them or a visiting nurse may be arranged for you. If you have staples or sutures along your incision they will be removed at your post-op appointment. You may have skin glue on your incision. Do not peel it off. It will come off on its own in about one week.  Wash the groin wound with soap and water daily and pat dry. (No tub bath-only shower)  Then put a dry gauze or washcloth in the groin to keep this area dry to help prevent wound infection.  This does not need to be taped. Do this daily and  replace as needed.  Do not use Vaseline or neosporin on your incisions.  Only use soap and water on your incisions and then protect and keep dry.  Wet to dry saline  dressing changes to right lateral fasciotomy wound and wrap with kerlix and 4" ace from foot to knee with mild compression.  Diet  Resume your normal diet. There are no special food restrictions following this procedure. A low fat/ low cholesterol diet is recommended for all patients with vascular disease. In order to heal from your surgery, it is CRITICAL to get adequate nutrition. Your body requires vitamins, minerals, and protein.  Vegetables are the best source of vitamins and minerals. Vegetables also provide the perfect balance of protein. Processed food has little nutritional value, so try to avoid this.  Medications  Resume taking all your medications unless your doctor or physician assistant tells you not to. If your incision is causing pain, you may take over-the-counter pain relievers such as acetaminophen (Tylenol). If you were prescribed a stronger pain medication, please aware these medication can cause nausea and constipation. Prevent nausea by taking the medication with a snack or meal. Avoid constipation by drinking plenty of fluids and eating foods with high amount of fiber, such as fruits, vegetables, and grains. Take Colace 100 mg (an over-the-counter stool softener) twice a day as needed for constipation.  Do not take Tylenol if you are taking prescription pain medications.  Follow Up  Our office will schedule a follow up appointment 2-3 weeks following discharge.  Please call us immediately for any of the following conditions  Severe or worsening pain in your legs or feet while at rest or while walking Increase pain, redness, warmth, or drainage (pus) from your incision site(s) Fever of 101 degree or higher The swelling in your leg with the bypass suddenly worsens and becomes more painful than when you were in the hospital If you have been instructed to feel your graft pulse then you should do so every day. If you can no longer feel this pulse, call the office immediately. Not all patients are given this instruction.  Leg swelling is common after leg bypass surgery.  The swelling should improve over a few months following surgery. To improve the swelling, you may elevate your legs above the level of your heart while you are sitting or resting. Your surgeon or physician assistant may ask you to apply an ACE wrap or wear compression (TED) stockings to help to reduce swelling.  Reduce your risk of vascular  disease  Stop smoking. If you would like help call [facility] at 1-800-QUIT-NOW ((865) 491-4486) or [place] at 7080425490.  Manage your cholesterol Maintain a desired weight Control your diabetes weight Control your diabetes Keep your blood pressure down  If you have any questions, please call the office at [phone]   Prescriptions given: 1.  Roxicet #30 No Refill (printed)  Start taking:  Crestor 20mg  daily Eliquis 5mg  bid  Change how you take:  Lantus solostar 40 units qhs  Toprol XL 25mg  bid  Disposition: SNF  Patient's condition: is Good  Follow up: 1. [facility] 12/27/2021 for wound check.   Doreatha Massed, PA-C [facility] [phone] 12/21/2021  9:17 AM  - For VQI Registry use ---   Post-op:  Wound infection: No  Graft infection: No  Transfusion: Yes    If yes, 2 units given New Arrhythmia: No Ipsilateral amputation: No, [ ]  Minor, [ ]  BKA, [ ]  AKA Discharge patency: [x ] Primary, [ ]  Primary assisted, [ ]  Secondary, [ ]  Occluded Patency judged by: [ ]  Dopper only, [ ]  Palpable graft pulse, [x]  Palpable distal pulse, [ ]  ABI inc. > 0.15, [ ]   Duplex Discharge ABI: R not done, L    Complications: MI: No, [ ]  Troponin only, [ ]  EKG or Clinical CHF: No Resp failure:No, [ ]  Pneumonia, [ ]  Ventilator Chg in renal function: No, [ ]  Inc. Cr > 0.5, [ ]  Temp. Dialysis,  [ ]  Permanent dialysis Stroke: No, [ ]  Minor, [ ]  Major Return to OR: No  Reason for return to OR: [ ]  Bleeding, [ ]  Infection, [ ]  Thrombosis, [ ]  Revision  Discharge medications: Statin use:  yes ASA use:  no Plavix use:  yes Beta blocker use: yes CCB use:  No ACEI use:   no ARB use:  no Coumadin use: no Eliquis:  Yes

## 2021-12-21 NOTE — Progress Notes (Signed)
Pt d/c from [facility] to [facility] [place] via [employer]. D/c paperwork sent with transportation. Tele and IV removed. Report called to nurse Thayer Ohm.  Brooke Pace, RN

## 2021-12-21 NOTE — ED Notes (Signed)
PTAR called by secretary & report was called back to her facility.

## 2021-12-21 NOTE — TOC Transition Note (Signed)
Transition of Care [facility]) - CM/SW Discharge Note   Patient Details  Name: Kelly Lee MRN: [id number] Date of Birth: 11/11/1951  Transition of Care [facility]) CM/SW Contact:  Eduard Roux, LCSW Phone Number: 12/21/2021, 11:44 AM   Clinical Narrative:     Patient will Discharge to: [facility] [place] Discharge Date: 12/21/21 Family Notified: daughter Transport By: Sharin Mons  Per MD patient is ready for discharge. RN, patient, and facility notified of discharge. Discharge Summary sent to facility. RN given number for [phone], Room 107. Ambulance transport requested for patient.   Clinical Social Worker signing off.  Antony Blackbird, MSW, LCSW Clinical Social Worker    Final next level of care: Skilled Nursing Facility Barriers to Discharge: Barriers Resolved   Patient Goals and CMS Choice Patient states their goals for this hospitalization and ongoing recovery are:: SNF CMS Medicare.gov Compare Post Acute Care list provided to:: Patient Choice offered to / list presented to : Patient  Discharge Placement              Patient chooses bed at: [facility] Patient to be transferred to facility by: PTAR Name of family member notified: daughter Patient and family notified of of transfer: 12/21/21  Discharge Plan and Services In-house Referral: Clinical Social Work                                   Social Determinants of Health (SDOH) Interventions     Readmission Risk Interventions Readmission Risk Prevention Plan 12/12/2019  Transportation Screening Complete  PCP or Specialist Appt within 5-7 Days Not Complete  Not Complete comments plan for SNF  Home Care Screening Complete  Medication Review (RN CM) Referral to Pharmacy  Some recent data might be hidden

## 2021-12-21 NOTE — Progress Notes (Signed)
PROGRESS NOTE   Kelly Lee  [id number]    DOB: August 08, 1951    DOA: 12/14/2021  PCP: Theodosia Blender, PA-C   I have briefly reviewed patients previous medical records in [facility].  Chief Complaint  Patient presents with   Extremity Weakness    Cool to the touch, loss of pulses in extremity    Leg Pain    Hospital Course:  71 y/o female admitted to the hospital with acute right leg ischemia secondary to arterial thrombus. She was admitted to the hospital under vascular surgery and underwent thrombectomy and 4 compartment fasciotomy. Post operatively, she developed hypotension and anemia requiring transfer to [facility] for closer monitoring. [facility] hospitalists asked to follow patient for medical management.  Determined to be medically stable and being discharged to [facility] 12/21/2021 by vascular surgery.   Assessment & Plan:  Principal Problem:   Critical lower limb ischemia ([place]) Active Problems:   Hypertension   Chronic respiratory failure with hypoxia (HCC)   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   Hyperthyroidism   Morbid obesity with BMI of 40.0-44.9, adult (HCC)   PAF (paroxysmal atrial fibrillation) (HCC)   Type 2 diabetes mellitus, with long-term current use of insulin (HCC)   Anemia   Cardiomyopathy ([place])   Pressure injury of skin   Assessment and Plan: * Critical lower limb ischemia ([place])- (present on admission) Admitted to vascular surgery service. S/p right lower leg arterial thrombectomy and right four compartment fasciotomy.  She was treated with IV heparin and transitioned to Eliquis on 2/6.  Unfortunately, anticoagulation had to be held due to development of hematoma at incision site.  Held further anticoagulation for now and Eliquis were resumed at discharge. Management per vascular surgery service.  As per vascular surgery follow-up, old hematoma evacuated from lateral fasciotomy site at bedside  today.  Has multiple staples and some soreness at right groin incision site with appropriate surrounding bruising.  No acute findings.  Wound VAC has been removed from the site  Cardiomyopathy [facility]) Noted to have EF of 35-40% on echo from 12/01/2020 Echo repeated on 2/2 and EF noted to be normal Appears to be on GDMT Resuming metoprolol, lasix and aldactone. She is also on jardiance Clinically euvolemic. Pravastatin has been switched to rosuvastatin.  Anemia- (present on admission) Hemoglobin 11.6 on admission Trended down to 8.1 Possibly hemodilution +/- blood loss during surgery She has been transfused a total of 2 unit prbc thus far Follow up hemoglobin stable at 10 >9.9 Continue to follow hemoglobin periodically at SNF.  Type 2 diabetes mellitus, with long-term current use of insulin ([place]) She takes Lantus, metformin and jardiance as outpatient Continued on lantus and SSI since admission Resume outpatient regimen on discharge Adjusting lantus and meal coverage novolog based on blood sugars -Blood sugars appear to be reasonably controlled here.  PAF (paroxysmal atrial fibrillation) ([place])- (present on admission) Chronically on metoprolol 200 mg daily Noted to be sinus bradycardia on admission Dose of metoprolol confirmed with patient's daughter Her CHA2DS2-VASc score of 8 Previous DC summary from [facility] in 11/2020 indicated that she was discharged with eliquis Patient reports that at some point Eliquis was discontinued Her daughter is unaware of any contraindication to anticoagulation She was initially treated with IV heparin since admission and was transitioned to Eliquis on 2/6 Anticoagulation had to be held due to development of hematoma at incision site Restarted Eliquis by vascular surgery at time of discharge. Heart rate has been  stable on metoprolol, although this is a reduced dose.  Appears that prior to admission, she was on Toprol-XL 200 Mg daily. We will continue reduced  dose until she can follow-up with her primary cardiologist  Morbid obesity with BMI of 40.0-44.9, adult ([place]) Chronic  Hyperthyroidism- (present on admission) She is chronically on methimazole which was continued on admission  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 Cardiac enzymes checked and noted to be negative Echocardiogram shows normal ejection fraction Had some atypical chest pain overnight, now resolved without recurrence.  Chronic respiratory failure with hypoxia ([place])- (present on admission) Appears to chronically be on 2L, however currently saturating in the high 90s on room air. Resp status currently stable and continue to monitor oxygen saturations closely at SNF.  Hypertension- (present on admission) She is on multiple antihypertensives at home including Toprol, Lasix, Aldactone  These were continued on admission, but patient became hypotensive so they were placed on hold All these medications have not been resumed. Blood pressures controlled.  Hypotension-resolved as of 12/17/2021     Pressure Injury 12/15/21 Coccyx Mid Stage 2 -  Partial thickness loss of dermis presenting as a shallow open injury with a red, pink wound bed without slough. pink (Active)  12/15/21 0208  Location: Coccyx  Location Orientation: Mid  Staging: Stage 2 -  Partial thickness loss of dermis presenting as a shallow open injury with a red, pink wound bed without slough.  Wound Description (Comments): pink  Present on Admission: Yes      DVT prophylaxis:   Eliquis   Code Status: Full Code:  Family Communication: None at bedside Disposition:  Status is: Inpatient Remains inpatient appropriate because: Severity of illness.  Being discharged to SNF today.     Consultants:   [facility] were consultants.  Procedures:   As noted above.  Antimicrobials:      Subjective:   Reports that she is feeling somewhat spaced out but unable to elaborate.  Denies any strokelike symptoms.  Denies dyspnea or pain.  Objective:   Vitals:   12/20/21 1928 12/20/21 2333 12/21/21 0548 12/21/21 0800  BP: 107/60 110/60 (!) 102/43 106/90  Pulse: 88 (!) 102 94 96  Resp: 19 19 16 18   Temp: 98 F (36.7 C) 98 F (36.7 C) 98.8 F (37.1 C) 98.5 F (36.9 C)  TempSrc: Oral Oral Oral Oral  SpO2: 98% 95% 97% 97%  Weight:      Height:        General exam: Elderly female, moderately built and nourished lying comfortably propped up in bed without distress. Respiratory system: Clear to auscultation. Respiratory effort normal. Cardiovascular system: S1 & S2 heard, RRR. No JVD, murmurs, rubs, gallops or clicks. No pedal edema. Gastrointestinal system: Abdomen is nondistended, soft and nontender. No organomegaly or masses felt. Normal bowel sounds heard. Central nervous system: Alert and oriented. No focal neurological deficits. Extremities: Symmetric 5 x 5 power.  Right leg postop dressing clean and dry.  Right groin with multiple staples at incision site.  No acute findings.  Appropriate bruising around the incision site. Skin: No rashes, lesions or ulcers Psychiatry: Judgement and insight appear normal. Mood & affect appropriate.     Data Reviewed:   I have personally reviewed following labs and imaging studies   CBC: Recent Labs  Lab 12/14/21 1920 12/14/21 2004 12/19/21 0359 12/20/21 0039 12/21/21 0342  WBC 9.7   < > 10.7* 11.1* 8.8  NEUTROABS 6.6  --   --   --   --   HGB 11.6*   < > 9.7* 10.0* 9.9*  HCT 36.9   < > 29.9* 31.4* 31.3*  MCV 91.1   < > 90.6 91.0 90.7  PLT 310   < > 290 325 364   < > = values in this interval not displayed.    Basic Metabolic Panel: Recent Labs  Lab 12/14/21 1920 12/14/21 2004 12/15/21 0555 12/16/21 0408 12/17/21 0125 12/18/21 0211  NA 136 140 135 133* 133* 135  K 3.2* 3.2* 3.9 3.6 3.7 4.3  CL 100 98 100 100 100 99  CO2 26   --  26 25 25 29   GLUCOSE 126* 149* 192* 222* 188* 125*  BUN 11 13 12 15 12 9   CREATININE 1.08* 1.00 0.87 1.04* 0.99 0.84  CALCIUM 8.7*  --  7.8* 8.0* 8.1* 8.5*  MG  --   --   --   --  1.7 2.0    Liver Function Tests: No results for input(s): AST, ALT, ALKPHOS, BILITOT, PROT, ALBUMIN in the last 168 hours.  CBG: Recent Labs  Lab 12/20/21 1538 12/20/21 2044 12/21/21 0549  GLUCAP 149* 188* 150*    Microbiology Studies:   Recent Results (from the past 240 hour(s))  Resp Panel by RT-PCR (Flu A&B, Covid) Nasopharyngeal Swab     Status: None   Collection Time: 12/14/21  7:13 PM   Specimen: Nasopharyngeal Swab; Nasopharyngeal(NP) swabs in vial transport medium  Result Value Ref Range Status   SARS Coronavirus 2 by RT PCR NEGATIVE NEGATIVE Final    Comment: (NOTE) SARS-CoV-2 target nucleic acids are NOT DETECTED.  The SARS-CoV-2 RNA is generally detectable in upper respiratory specimens during the acute phase of infection. The lowest concentration of SARS-CoV-2 viral copies this assay can detect is 138 copies/mL. A negative result does not preclude SARS-Cov-2 infection and should not be used as the sole basis for treatment or other patient management decisions. A negative result may occur with  improper specimen collection/handling, submission of specimen other than nasopharyngeal swab, presence of viral mutation(s) within the areas targeted by this assay, and inadequate number of viral copies(<138 copies/mL). A negative result must be combined with clinical observations, patient history, and epidemiological information. The expected result is Negative.  Fact Sheet for Patients:  EntrepreneurPulse.com.au  Fact Sheet for Healthcare Providers:  IncredibleEmployment.be  This test is no t yet approved or cleared by the [place] FDA and  has been authorized for detection and/or diagnosis of SARS-CoV-2 by FDA under an Emergency Use  Authorization (EUA). This EUA will remain  in effect (meaning this test can be used) for the duration of the COVID-19 declaration under Section 564(b)(1) of the Act, 21 U.S.C.section 360bbb-3(b)(1), unless the authorization is terminated  or revoked sooner.       Influenza A by PCR NEGATIVE NEGATIVE Final   Influenza B by PCR NEGATIVE NEGATIVE Final    Comment: (NOTE) The Xpert Xpress SARS-CoV-2/FLU/RSV plus assay is intended as an aid in the diagnosis of influenza from Nasopharyngeal swab specimens and should not be used as a sole basis for treatment. Nasal washings and aspirates are unacceptable for Xpert Xpress SARS-CoV-2/FLU/RSV testing.  Fact Sheet for Patients: EntrepreneurPulse.com.au  Fact Sheet for Healthcare Providers: IncredibleEmployment.be  This test is not yet approved or cleared by the [place] FDA and has been authorized for detection and/or diagnosis of SARS-CoV-2 by FDA under an Emergency Use Authorization (EUA). This  EUA will remain in effect (meaning this test can be used) for the duration of the COVID-19 declaration under Section 564(b)(1) of the Act, 21 U.S.C. section 360bbb-3(b)(1), unless the authorization is terminated or revoked.  Performed at [facility], [place] [address], [place], [place] [address]   SARS CORONAVIRUS 2 (TAT 6-24 HRS) Nasopharyngeal Nasopharyngeal Swab     Status: None   Collection Time: 12/19/21  3:24 PM   Specimen: Nasopharyngeal Swab  Result Value Ref Range Status   SARS Coronavirus 2 NEGATIVE NEGATIVE Final    Comment: (NOTE) SARS-CoV-2 target nucleic acids are NOT DETECTED.  The SARS-CoV-2 RNA is generally detectable in upper and lower respiratory specimens during the acute phase of infection. Negative results do not preclude SARS-CoV-2 infection, do not rule out co-infections with other pathogens, and should not be used as the sole basis for treatment or other patient  management decisions. Negative results must be combined with clinical observations, patient history, and epidemiological information. The expected result is Negative.  Fact Sheet for Patients: SugarRoll.be  Fact Sheet for Healthcare Providers: https://www.woods-mathews.com/  This test is not yet approved or cleared by the [place] FDA and  has been authorized for detection and/or diagnosis of SARS-CoV-2 by FDA under an Emergency Use Authorization (EUA). This EUA will remain  in effect (meaning this test can be used) for the duration of the COVID-19 declaration under Se ction 564(b)(1) of the Act, 21 U.S.C. section 360bbb-3(b)(1), unless the authorization is terminated or revoked sooner.  Performed at [facility], [place] [address] [place], [place] [address]     Radiology Studies:  No results found.  Scheduled Meds:    sodium chloride   Intravenous Once   apixaban  5 mg Oral BID   clopidogrel  75 mg Oral Daily   docusate sodium  100 mg Oral Daily   empagliflozin  10 mg Oral Daily   ezetimibe  10 mg Oral Daily   furosemide  20 mg Oral Daily   insulin aspart  0-15 Units Subcutaneous TID WC   insulin aspart  0-5 Units Subcutaneous QHS   insulin aspart  5 Units Subcutaneous TID WC   insulin glargine-yfgn  40 Units Subcutaneous QHS   methimazole  5 mg Oral Daily   metoprolol succinate  25 mg Oral BID   mirabegron ER  50 mg Oral Daily   pantoprazole  40 mg Oral Daily   rosuvastatin  20 mg Oral QHS   senna-docusate  1 tablet Oral BID   spironolactone  12.5 mg Oral Daily    Continuous Infusions:    sodium chloride Stopped (12/17/21 1925)     LOS: 6 days     Vernell Leep, MD,  FACP, [place], [facility], [facility] (Care Management Physician Certified) [place]  To contact the attending provider between 7A-7P or the covering provider during after hours 7P-7A, please log into the web site  [email or web address] and access using universal [place] password for that web site. If you do not have the password, please call the hospital operator.  12/21/2021, 10:20 AM

## 2021-12-21 NOTE — Care Management (Signed)
ED [facility] consult concerning patient who was recently discharged to SNF from [facility] who returned back to the ED via [facility], . CM contacted DON at facility, who stated that [facility] reported that patient's B/P was noted to be low.  As per ED staff this is appears to be patient's baseline V/S.  According to DON patient may return once ED evaluation is completed and patient medically cleared. Updated Press photographer.

## 2021-12-21 NOTE — Progress Notes (Signed)
In report: informed receiving nurse pts last BM 12/14/2021. This RN was placed on hold then receiving nurse returned stating DON said facility would need a KUB prior to accepting pt. Informed nurse pt was just wheeled out via [facility] during report. Nurse then shared that info with DON and nurse was then told "okay, its fine" and said pt could come. Pt arrived at [facility]. Pt was sent back to ED with [facility] d/t pt last BM 12/14/2021.  Brooke Pace, RN

## 2021-12-21 NOTE — ED Notes (Signed)
[facility] [place]  pt alert

## 2021-12-21 NOTE — Progress Notes (Signed)
°  Progress Note    12/21/2021 9:18 AM 7 Days Post-Op  Subjective:  says she is a little sore in the right leg.  Afebrile HR 80's-100's NSR 0000000 systolic 0000000 RA  Vitals:   12/21/21 0548 12/21/21 0800  BP: (!) 102/43 106/90  Pulse: 94 96  Resp: 16 18  Temp: 98.8 F (37.1 C) 98.5 F (36.9 C)  SpO2: 97% 97%    Physical Exam: Cardiac:  regular Lungs:  non labored Incisions:  medial fasciotomy site clean with staples in tact; lateral fasciotomy with old hematoma present.   Extremities:  right foot is warm and well perfused.    CBC    Component Value Date/Time   WBC 8.8 12/21/2021 0342   RBC 3.45 (L) 12/21/2021 0342   HGB 9.9 (L) 12/21/2021 0342   HCT 31.3 (L) 12/21/2021 0342   PLT 364 12/21/2021 0342   MCV 90.7 12/21/2021 0342   MCH 28.7 12/21/2021 0342   MCHC 31.6 12/21/2021 0342   RDW 15.5 12/21/2021 0342   LYMPHSABS 2.2 12/14/2021 1920   MONOABS 0.6 12/14/2021 1920   EOSABS 0.2 12/14/2021 1920   BASOSABS 0.1 12/14/2021 1920    BMET    Component Value Date/Time   NA 135 12/18/2021 0211   K 4.3 12/18/2021 0211   CL 99 12/18/2021 0211   CO2 29 12/18/2021 0211   GLUCOSE 125 (H) 12/18/2021 0211   BUN 9 12/18/2021 0211   CREATININE 0.84 12/18/2021 0211   CALCIUM 8.5 (L) 12/18/2021 0211   GFRNONAA >60 12/18/2021 0211   GFRAA >60 12/17/2019 0347    INR No results found for: INR   Intake/Output Summary (Last 24 hours) at 12/21/2021 0918 Last data filed at 12/21/2021 0500 Gross per 24 hour  Intake 250 ml  Output 3300 ml  Net -3050 ml     Assessment/Plan:  71 y.o. female is s/p:  RLE thrombectomy and 4 compartment fasciotomies  7 Days Post-Op   -wet to dry dressing removed.  Old hematoma evacuated from lateral fasciotomy site at bedside after IV pain medication given.  She tolerated this well.  Will discharge to SNF today with instructions of wet to dry saline dressing changes daily and we will see her in our office on 12/27/2021 for wound check  and most likely instructions for wound vac placement at facility. -DVT prophylaxis:  Eliquis. -will remove Prevena vac today from right groin.   Leontine Locket, PA-C [facility] [phone] 12/21/2021 9:18 AM

## 2021-12-27 ENCOUNTER — Encounter: Payer: Medicare Other | Admitting: Vascular Surgery

## 2022-01-02 ENCOUNTER — Telehealth: Payer: Self-pay | Admitting: *Deleted

## 2022-01-02 NOTE — Progress Notes (Signed)
[facility] [place] PROGRESS NOTE  ASSESSMENT / PLAN: Kelly Lee is a 71 y.o. female status post right lower extremity arterial thrombectomy and calf fasciotomy  12/14/21 for acute limb threatening ischemia from likely cardiac thrombus. Wounds healing appropriately. I removed the staples from the groin and medial calf incision. The lateral calf is healing appropriately. Start betadine daily to the right groin.  Continue wet-to-dry dressings to the lateral calf.  Follow-up with me in 3 months with ABI.  Patient must continue anticoagulation indefinitely.  SUBJECTIVE: Doing well.  In skilled nursing facility.  Recovering slowly.  Wounds appear to be healing.  Her staples are bothersome to her.  The staples catch on her clothing and cause her pain.  The lateral calf wound is healing slowly.  There is some greenish discoloration on the gauze.  OBJECTIVE: BP 101/65 (BP Location: Left Arm, Patient Position: Sitting, Cuff Size: Normal)    Pulse 91    Temp 98.8 F (37.1 C)    Resp 20    Ht 5\' 1"  (1.549 m)    Wt 200 lb (90.7 kg)    SpO2 96%    BMI 37.79 kg/m   Constitutional: Elderly.  Chronically ill-appearing.  In no distress Cardiac: Regular rate and rhythm. Pulmonary: Unlabored breathing Abdomen: Soft abdomen Vascular: Right groin incision healed.  Staples in place.  Hematoma subcutaneously.  Calf fasciotomy is healing appropriately.  Medial wound healed.  Lateral wound healing.  Healthy granulation tissue at the base of the wound.  Some greenish discoloration on the bandage.  2+ dorsalis pedis pulse.  CBC Latest Ref Rng & Units 12/21/2021 12/20/2021 12/19/2021  WBC 4.0 - 10.5 K/uL 8.8 11.1(H) 10.7(H)  Hemoglobin 12.0 - 15.0 g/dL 9.9(L) 10.0(L) 9.7(L)  Hematocrit 36.0 - 46.0 % 31.3(L) 31.4(L) 29.9(L)  Platelets 150 - 400 K/uL 364 325 290     CMP Latest Ref Rng & Units 12/18/2021 12/17/2021 12/16/2021  Glucose 70 - 99 mg/dL 125(H) 188(H) 222(H)  BUN 8 - 23 mg/dL 9 12 15    Creatinine 0.44 - 1.00 mg/dL 0.84 0.99 1.04(H)  Sodium 135 - 145 mmol/L 135 133(L) 133(L)  Potassium 3.5 - 5.1 mmol/L 4.3 3.7 3.6  Chloride 98 - 111 mmol/L 99 100 100  CO2 22 - 32 mmol/L 29 25 25   Calcium 8.9 - 10.3 mg/dL 8.5(L) 8.1(L) 8.0(L)  Total Protein 6.5 - 8.1 g/dL - - -  Total Bilirubin 0.3 - 1.2 mg/dL - - -  Alkaline Phos 38 - 126 U/L - - -  AST 15 - 41 U/L - - -  ALT 0 - 44 U/L - - -    Estimated Creatinine Clearance: 63 mL/min (by C-G formula based on SCr of 0.84 mg/dL).  Yevonne Aline. Stanford Breed, MD [facility] Phone Number: [phone] 01/03/2022 1:36 PM

## 2022-01-02 NOTE — Telephone Encounter (Signed)
Baird Lyons, nurse with [facility] called stating patient's right groin is red, swollen with purulent drainage. Staples are not secure. PA at their site is questioning if patient is need of an antibiotic.  Scheduled an appointment with Dr Lenell Antu 01/03/2022.  The facility was notified.

## 2022-01-03 ENCOUNTER — Encounter: Payer: Self-pay | Admitting: Vascular Surgery

## 2022-01-03 ENCOUNTER — Other Ambulatory Visit: Payer: Self-pay

## 2022-01-03 ENCOUNTER — Ambulatory Visit (INDEPENDENT_AMBULATORY_CARE_PROVIDER_SITE_OTHER): Payer: Medicare Other | Admitting: Vascular Surgery

## 2022-01-03 VITALS — BP 101/65 | HR 91 | Temp 98.8°F | Resp 20 | Ht 61.0 in | Wt 200.0 lb

## 2022-01-03 DIAGNOSIS — Z9889 Other specified postprocedural states: Secondary | ICD-10-CM

## 2022-01-10 ENCOUNTER — Encounter: Payer: Medicare Other | Admitting: Vascular Surgery

## 2022-01-10 ENCOUNTER — Other Ambulatory Visit: Payer: Self-pay | Admitting: *Deleted

## 2022-01-10 DIAGNOSIS — Z9889 Other specified postprocedural states: Secondary | ICD-10-CM

## 2022-01-10 DIAGNOSIS — I70229 Atherosclerosis of native arteries of extremities with rest pain, unspecified extremity: Secondary | ICD-10-CM

## 2022-01-17 ENCOUNTER — Encounter (HOSPITAL_COMMUNITY): Payer: Medicare Other

## 2022-02-14 ENCOUNTER — Emergency Department (HOSPITAL_COMMUNITY): Payer: Medicare Other

## 2022-02-14 ENCOUNTER — Encounter (HOSPITAL_COMMUNITY): Payer: Self-pay

## 2022-02-14 ENCOUNTER — Inpatient Hospital Stay (HOSPITAL_COMMUNITY)
Admission: EM | Admit: 2022-02-14 | Discharge: 2022-03-02 | DRG: 871 | Disposition: A | Discharge status: Home Health | Payer: Medicare Other | Attending: Internal Medicine | Admitting: Internal Medicine

## 2022-02-14 ENCOUNTER — Other Ambulatory Visit: Payer: Self-pay

## 2022-02-14 DIAGNOSIS — Z794 Long term (current) use of insulin: Secondary | ICD-10-CM | POA: Diagnosis not present

## 2022-02-14 DIAGNOSIS — I361 Nonrheumatic tricuspid (valve) insufficiency: Secondary | ICD-10-CM | POA: Diagnosis not present

## 2022-02-14 DIAGNOSIS — K5900 Constipation, unspecified: Secondary | ICD-10-CM | POA: Diagnosis present

## 2022-02-14 DIAGNOSIS — R579 Shock, unspecified: Secondary | ICD-10-CM | POA: Diagnosis not present

## 2022-02-14 DIAGNOSIS — Z951 Presence of aortocoronary bypass graft: Secondary | ICD-10-CM

## 2022-02-14 DIAGNOSIS — E785 Hyperlipidemia, unspecified: Secondary | ICD-10-CM | POA: Diagnosis present

## 2022-02-14 DIAGNOSIS — T50905S Adverse effect of unspecified drugs, medicaments and biological substances, sequela: Secondary | ICD-10-CM | POA: Diagnosis not present

## 2022-02-14 DIAGNOSIS — R509 Fever, unspecified: Secondary | ICD-10-CM | POA: Diagnosis not present

## 2022-02-14 DIAGNOSIS — E039 Hypothyroidism, unspecified: Secondary | ICD-10-CM | POA: Diagnosis present

## 2022-02-14 DIAGNOSIS — E119 Type 2 diabetes mellitus without complications: Secondary | ICD-10-CM | POA: Diagnosis not present

## 2022-02-14 DIAGNOSIS — D649 Anemia, unspecified: Secondary | ICD-10-CM | POA: Diagnosis present

## 2022-02-14 DIAGNOSIS — E876 Hypokalemia: Secondary | ICD-10-CM | POA: Diagnosis present

## 2022-02-14 DIAGNOSIS — I472 Ventricular tachycardia, unspecified: Secondary | ICD-10-CM | POA: Diagnosis not present

## 2022-02-14 DIAGNOSIS — J15211 Pneumonia due to Methicillin susceptible Staphylococcus aureus: Secondary | ICD-10-CM | POA: Diagnosis present

## 2022-02-14 DIAGNOSIS — J189 Pneumonia, unspecified organism: Secondary | ICD-10-CM | POA: Diagnosis present

## 2022-02-14 DIAGNOSIS — I25810 Atherosclerosis of coronary artery bypass graft(s) without angina pectoris: Secondary | ICD-10-CM | POA: Diagnosis present

## 2022-02-14 DIAGNOSIS — Z6836 Body mass index (BMI) 36.0-36.9, adult: Secondary | ICD-10-CM

## 2022-02-14 DIAGNOSIS — E11649 Type 2 diabetes mellitus with hypoglycemia without coma: Secondary | ICD-10-CM | POA: Diagnosis not present

## 2022-02-14 DIAGNOSIS — R7881 Bacteremia: Secondary | ICD-10-CM | POA: Diagnosis not present

## 2022-02-14 DIAGNOSIS — E66812 Obesity, class 2: Secondary | ICD-10-CM

## 2022-02-14 DIAGNOSIS — Z86718 Personal history of other venous thrombosis and embolism: Secondary | ICD-10-CM

## 2022-02-14 DIAGNOSIS — T50905A Adverse effect of unspecified drugs, medicaments and biological substances, initial encounter: Secondary | ICD-10-CM | POA: Diagnosis not present

## 2022-02-14 DIAGNOSIS — Z7901 Long term (current) use of anticoagulants: Secondary | ICD-10-CM | POA: Diagnosis not present

## 2022-02-14 DIAGNOSIS — L89152 Pressure ulcer of sacral region, stage 2: Secondary | ICD-10-CM | POA: Diagnosis present

## 2022-02-14 DIAGNOSIS — Z20822 Contact with and (suspected) exposure to covid-19: Secondary | ICD-10-CM | POA: Diagnosis present

## 2022-02-14 DIAGNOSIS — Z7902 Long term (current) use of antithrombotics/antiplatelets: Secondary | ICD-10-CM

## 2022-02-14 DIAGNOSIS — E1165 Type 2 diabetes mellitus with hyperglycemia: Secondary | ICD-10-CM | POA: Diagnosis present

## 2022-02-14 DIAGNOSIS — E669 Obesity, unspecified: Secondary | ICD-10-CM | POA: Diagnosis present

## 2022-02-14 DIAGNOSIS — I10 Essential (primary) hypertension: Secondary | ICD-10-CM | POA: Diagnosis present

## 2022-02-14 DIAGNOSIS — E1151 Type 2 diabetes mellitus with diabetic peripheral angiopathy without gangrene: Secondary | ICD-10-CM | POA: Diagnosis present

## 2022-02-14 DIAGNOSIS — T361X5A Adverse effect of cephalosporins and other beta-lactam antibiotics, initial encounter: Secondary | ICD-10-CM | POA: Diagnosis not present

## 2022-02-14 DIAGNOSIS — Z7984 Long term (current) use of oral hypoglycemic drugs: Secondary | ICD-10-CM | POA: Diagnosis not present

## 2022-02-14 DIAGNOSIS — Z955 Presence of coronary angioplasty implant and graft: Secondary | ICD-10-CM

## 2022-02-14 DIAGNOSIS — E0590 Thyrotoxicosis, unspecified without thyrotoxic crisis or storm: Secondary | ICD-10-CM | POA: Diagnosis present

## 2022-02-14 DIAGNOSIS — B9561 Methicillin susceptible Staphylococcus aureus infection as the cause of diseases classified elsewhere: Secondary | ICD-10-CM | POA: Diagnosis not present

## 2022-02-14 DIAGNOSIS — I252 Old myocardial infarction: Secondary | ICD-10-CM

## 2022-02-14 DIAGNOSIS — Z79899 Other long term (current) drug therapy: Secondary | ICD-10-CM | POA: Diagnosis not present

## 2022-02-14 DIAGNOSIS — A419 Sepsis, unspecified organism: Secondary | ICD-10-CM | POA: Diagnosis not present

## 2022-02-14 DIAGNOSIS — I2781 Cor pulmonale (chronic): Secondary | ICD-10-CM | POA: Diagnosis present

## 2022-02-14 DIAGNOSIS — R6521 Severe sepsis with septic shock: Secondary | ICD-10-CM | POA: Diagnosis present

## 2022-02-14 DIAGNOSIS — I340 Nonrheumatic mitral (valve) insufficiency: Secondary | ICD-10-CM | POA: Diagnosis not present

## 2022-02-14 DIAGNOSIS — L270 Generalized skin eruption due to drugs and medicaments taken internally: Secondary | ICD-10-CM | POA: Diagnosis not present

## 2022-02-14 DIAGNOSIS — I2510 Atherosclerotic heart disease of native coronary artery without angina pectoris: Secondary | ICD-10-CM | POA: Diagnosis present

## 2022-02-14 DIAGNOSIS — A4101 Sepsis due to Methicillin susceptible Staphylococcus aureus: Secondary | ICD-10-CM | POA: Diagnosis present

## 2022-02-14 DIAGNOSIS — I480 Paroxysmal atrial fibrillation: Secondary | ICD-10-CM | POA: Diagnosis present

## 2022-02-14 DIAGNOSIS — Z9049 Acquired absence of other specified parts of digestive tract: Secondary | ICD-10-CM

## 2022-02-14 LAB — COMPREHENSIVE METABOLIC PANEL
ALT: 8 U/L (ref 0–44)
AST: 13 U/L — ABNORMAL LOW (ref 15–41)
Albumin: 2.4 g/dL — ABNORMAL LOW (ref 3.5–5.0)
Alkaline Phosphatase: 72 U/L (ref 38–126)
Anion gap: 4 — ABNORMAL LOW (ref 5–15)
BUN: 7 mg/dL — ABNORMAL LOW (ref 8–23)
CO2: 25 mmol/L (ref 22–32)
Calcium: 7.4 mg/dL — ABNORMAL LOW (ref 8.9–10.3)
Chloride: 106 mmol/L (ref 98–111)
Creatinine, Ser: 0.64 mg/dL (ref 0.44–1.00)
GFR, Estimated: >60 mL/min (ref >60)
Glucose, Bld: 337 mg/dL — ABNORMAL HIGH (ref 70–99)
Potassium: 2.8 mmol/L — ABNORMAL LOW (ref 3.5–5.1)
Sodium: 135 mmol/L (ref 135–145)
Total Bilirubin: 0.8 mg/dL (ref 0.3–1.2)
Total Protein: 6.6 g/dL (ref 6.5–8.1)

## 2022-02-14 LAB — CBC WITH DIFFERENTIAL/PLATELET
Abs Immature Granulocytes: 0.05 10*3/uL (ref 0.00–0.07)
Basophils Absolute: 0 10*3/uL (ref 0.0–0.1)
Basophils Relative: 0 %
Eosinophils Absolute: 0 10*3/uL (ref 0.0–0.5)
Eosinophils Relative: 0 %
HCT: 32 % — ABNORMAL LOW (ref 36.0–46.0)
Hemoglobin: 10.2 g/dL — ABNORMAL LOW (ref 12.0–15.0)
Immature Granulocytes: 1 %
Lymphocytes Relative: 21 %
Lymphs Abs: 2 10*3/uL (ref 0.7–4.0)
MCH: 27.1 pg (ref 26.0–34.0)
MCHC: 31.9 g/dL (ref 30.0–36.0)
MCV: 85.1 fL (ref 80.0–100.0)
Monocytes Absolute: 0.7 10*3/uL (ref 0.1–1.0)
Monocytes Relative: 7 %
Neutro Abs: 6.8 10*3/uL (ref 1.7–7.7)
Neutrophils Relative %: 71 %
Platelets: 315 10*3/uL (ref 150–400)
RBC: 3.76 MIL/uL — ABNORMAL LOW (ref 3.87–5.11)
RDW: 15.1 % (ref 11.5–15.5)
WBC: 9.6 10*3/uL (ref 4.0–10.5)
nRBC: 0 % (ref 0.0–0.2)

## 2022-02-14 LAB — RESP PANEL BY RT-PCR (FLU A&B, COVID) ARPGX2
Influenza A by PCR: NEGATIVE
Influenza B by PCR: NEGATIVE
SARS Coronavirus 2 by RT PCR: NEGATIVE

## 2022-02-14 LAB — RENAL FUNCTION PANEL
Albumin: 2.3 g/dL — ABNORMAL LOW (ref 3.5–5.0)
Anion gap: 5 (ref 5–15)
BUN: 7 mg/dL — ABNORMAL LOW (ref 8–23)
CO2: 27 mmol/L (ref 22–32)
Calcium: 7.9 mg/dL — ABNORMAL LOW (ref 8.9–10.3)
Chloride: 101 mmol/L (ref 98–111)
Creatinine, Ser: 0.5 mg/dL (ref 0.44–1.00)
GFR, Estimated: >60 mL/min (ref >60)
Glucose, Bld: 208 mg/dL — ABNORMAL HIGH (ref 70–99)
Phosphorus: 1.5 mg/dL — ABNORMAL LOW (ref 2.5–4.6)
Potassium: 2.7 mmol/L — CL (ref 3.5–5.1)
Sodium: 133 mmol/L — ABNORMAL LOW (ref 135–145)

## 2022-02-14 LAB — CBG MONITORING, ED: Glucose-Capillary: 376 mg/dL — ABNORMAL HIGH (ref 70–99)

## 2022-02-14 LAB — LACTIC ACID, PLASMA
Lactic Acid, Venous: 3.4 mmol/L (ref 0.5–1.9)
Lactic Acid, Venous: 1.9 mmol/L (ref 0.5–1.9)
Lactic Acid, Venous: 1.5 mmol/L (ref 0.5–1.9)

## 2022-02-14 LAB — MAGNESIUM: Magnesium: 1.6 mg/dL — ABNORMAL LOW (ref 1.7–2.4)

## 2022-02-14 MED ORDER — CALCIUM CARBONATE ANTACID 500 MG PO CHEW
1.0000 | CHEWABLE_TABLET | Freq: Two times a day (BID) | ORAL | Status: DC
  Administered 2022-02-14 – 2022-03-02 (×32): 200 mg via ORAL
  Filled 2022-02-14 (×32): qty 1

## 2022-02-14 MED ORDER — APIXABAN 5 MG PO TABS
5.0000 mg | ORAL_TABLET | Freq: Two times a day (BID) | ORAL | Status: DC
  Administered 2022-02-14 – 2022-03-02 (×32): 5 mg via ORAL
  Filled 2022-02-14 (×32): qty 1

## 2022-02-14 MED ORDER — ACETAMINOPHEN 325 MG PO TABS
650.0000 mg | ORAL_TABLET | Freq: Four times a day (QID) | ORAL | Status: DC | PRN
  Administered 2022-02-15 – 2022-02-28 (×13): 650 mg via ORAL
  Filled 2022-02-14 (×13): qty 2

## 2022-02-14 MED ORDER — INSULIN ASPART 100 UNIT/ML IJ SOLN
10.0000 [IU] | Freq: Once | INTRAMUSCULAR | Status: AC
  Administered 2022-02-14: 10 [IU] via SUBCUTANEOUS
  Filled 2022-02-14: qty 0.1

## 2022-02-14 MED ORDER — INSULIN ASPART 100 UNIT/ML IJ SOLN
0.0000 [IU] | Freq: Three times a day (TID) | INTRAMUSCULAR | Status: DC
  Administered 2022-02-15 (×3): 7 [IU] via SUBCUTANEOUS
  Administered 2022-02-16: 4 [IU] via SUBCUTANEOUS
  Administered 2022-02-17 (×2): 3 [IU] via SUBCUTANEOUS
  Administered 2022-02-18 – 2022-02-19 (×4): 4 [IU] via SUBCUTANEOUS
  Administered 2022-02-20 (×2): 7 [IU] via SUBCUTANEOUS
  Administered 2022-02-20: 4 [IU] via SUBCUTANEOUS
  Administered 2022-02-21: 3 [IU] via SUBCUTANEOUS
  Administered 2022-02-21 (×2): 4 [IU] via SUBCUTANEOUS
  Administered 2022-02-22: 20 [IU] via SUBCUTANEOUS
  Administered 2022-02-22 (×2): 7 [IU] via SUBCUTANEOUS
  Administered 2022-02-23: 4 [IU] via SUBCUTANEOUS
  Administered 2022-02-23: 7 [IU] via SUBCUTANEOUS
  Administered 2022-02-23: 20 [IU] via SUBCUTANEOUS
  Administered 2022-02-24: 4 [IU] via SUBCUTANEOUS
  Administered 2022-02-24: 3 [IU] via SUBCUTANEOUS
  Administered 2022-02-24 – 2022-02-25 (×2): 4 [IU] via SUBCUTANEOUS
  Administered 2022-02-25: 7 [IU] via SUBCUTANEOUS
  Administered 2022-02-25: 4 [IU] via SUBCUTANEOUS
  Administered 2022-02-26 (×2): 7 [IU] via SUBCUTANEOUS
  Administered 2022-02-26: 4 [IU] via SUBCUTANEOUS
  Administered 2022-02-27: 7 [IU] via SUBCUTANEOUS
  Administered 2022-02-27: 11 [IU] via SUBCUTANEOUS
  Administered 2022-02-28 (×2): 7 [IU] via SUBCUTANEOUS
  Administered 2022-02-28 – 2022-03-01 (×2): 11 [IU] via SUBCUTANEOUS
  Administered 2022-03-01 (×2): 7 [IU] via SUBCUTANEOUS
  Administered 2022-03-02 (×2): 4 [IU] via SUBCUTANEOUS

## 2022-02-14 MED ORDER — ALBUTEROL SULFATE (2.5 MG/3ML) 0.083% IN NEBU: 2.5000 mg | INHALATION_SOLUTION | RESPIRATORY_TRACT | Status: DC | PRN

## 2022-02-14 MED ORDER — GUAIFENESIN ER 600 MG PO TB12
600.0000 mg | ORAL_TABLET | Freq: Two times a day (BID) | ORAL | Status: DC
  Administered 2022-02-14 – 2022-03-02 (×32): 600 mg via ORAL
  Filled 2022-02-14 (×32): qty 1

## 2022-02-14 MED ORDER — ONDANSETRON HCL 4 MG/2ML IJ SOLN
4.0000 mg | Freq: Four times a day (QID) | INTRAMUSCULAR | Status: DC | PRN
  Administered 2022-02-17 – 2022-02-18 (×2): 4 mg via INTRAVENOUS
  Filled 2022-02-14 (×2): qty 2

## 2022-02-14 MED ORDER — LACTATED RINGERS IV BOLUS
1000.0000 mL | Freq: Once | INTRAVENOUS | Status: AC
  Administered 2022-02-14: 1000 mL via INTRAVENOUS

## 2022-02-14 MED ORDER — LEVOFLOXACIN IN D5W 750 MG/150ML IV SOLN
750.0000 mg | Freq: Once | INTRAVENOUS | Status: AC
  Administered 2022-02-14: 750 mg via INTRAVENOUS
  Filled 2022-02-14: qty 150

## 2022-02-14 MED ORDER — LEVOFLOXACIN IN D5W 750 MG/150ML IV SOLN
750.0000 mg | INTRAVENOUS | Status: DC
  Filled 2022-02-14: qty 150

## 2022-02-14 MED ORDER — ONDANSETRON HCL 4 MG PO TABS: 4.0000 mg | ORAL_TABLET | Freq: Four times a day (QID) | ORAL | Status: DC | PRN

## 2022-02-14 MED ORDER — INSULIN ASPART 100 UNIT/ML IJ SOLN
0.0000 [IU] | Freq: Every day | INTRAMUSCULAR | Status: DC
  Administered 2022-02-15 – 2022-02-18 (×2): 2 [IU] via SUBCUTANEOUS
  Administered 2022-02-19: 4 [IU] via SUBCUTANEOUS
  Administered 2022-02-21 – 2022-02-22 (×2): 2 [IU] via SUBCUTANEOUS
  Administered 2022-02-23 – 2022-02-26 (×4): 3 [IU] via SUBCUTANEOUS
  Administered 2022-02-27: 4 [IU] via SUBCUTANEOUS
  Administered 2022-02-28: 2 [IU] via SUBCUTANEOUS
  Administered 2022-03-01: 3 [IU] via SUBCUTANEOUS

## 2022-02-14 MED ORDER — POTASSIUM CHLORIDE CRYS ER 20 MEQ PO TBCR
40.0000 meq | EXTENDED_RELEASE_TABLET | Freq: Every day | ORAL | Status: DC
  Administered 2022-02-14 – 2022-02-23 (×8): 40 meq via ORAL
  Filled 2022-02-14 (×10): qty 2

## 2022-02-14 MED ORDER — POTASSIUM CHLORIDE 10 MEQ/100ML IV SOLN
10.0000 meq | INTRAVENOUS | Status: AC
  Administered 2022-02-14 – 2022-02-15 (×4): 10 meq via INTRAVENOUS
  Filled 2022-02-14 (×4): qty 100

## 2022-02-14 MED ORDER — LACTATED RINGERS IV SOLN: INTRAVENOUS | Status: AC

## 2022-02-14 MED ORDER — LACTATED RINGERS IV BOLUS
500.0000 mL | Freq: Once | INTRAVENOUS | Status: AC
  Administered 2022-02-14: 500 mL via INTRAVENOUS

## 2022-02-14 MED ORDER — GUAIFENESIN-DM 100-10 MG/5ML PO SYRP
5.0000 mL | ORAL_SOLUTION | ORAL | Status: DC | PRN
  Administered 2022-02-14 – 2022-02-23 (×15): 5 mL via ORAL
  Filled 2022-02-14: qty 10
  Filled 2022-02-14 (×2): qty 5
  Filled 2022-02-14 (×4): qty 10
  Filled 2022-02-14: qty 5
  Filled 2022-02-14 (×3): qty 10
  Filled 2022-02-14 (×3): qty 5
  Filled 2022-02-14: qty 10

## 2022-02-14 MED ORDER — ACETAMINOPHEN 650 MG RE SUPP: 650.0000 mg | Freq: Four times a day (QID) | RECTAL | Status: DC | PRN

## 2022-02-14 NOTE — H&P (Signed)
?History and Physical  ? ? ?Patient: Kelly Lee [id number] DOB: [birth year] ?DOA: 02/14/2022 ?DOS: the patient was seen and examined on 02/14/2022 ?PCP: Theodosia Blender, PA-C  ?Patient coming from: Home ? ?Chief Complaint:  ?Chief Complaint  ?Patient presents with  ? Weakness  ? Hyperglycemia  ? ?HPI: Kelly Lee is a 71 y.o. female with medical history significant of HTN, DM2. Presenting with weakness. She reports that she has felt weak for at least the last 4 days. She has had poor PO intake during that time. She has had nausea, but no vomiting. She reports productive cough, but no fever. She tried APAP, advil and allergy meds to help; however, they didn't work. In addition, she has notice some draining from her pannus wound. When she didn't feel better today, she called EMS to take her to the ED for evaluation. ? ?Review of Systems: As mentioned in the history of present illness. All other systems reviewed and are negative. ?Past Medical History:  ?Diagnosis Date  ? Closed intertrochanteric fracture, left, initial encounter ([place]) 12/08/2019  ? Diabetes mellitus without complication ([place])   ? Hypertension   ? ?Past Surgical History:  ?Procedure Laterality Date  ? CARDIAC CATHETERIZATION    ? CHOLECYSTECTOMY    ? FASCIOTOMY Right 12/14/2021  ? Procedure: RIGHT LOWER FASCIOTOMY;  Surgeon: Cherre Robins, MD;  Location: [facility];  Service: Vascular;  Laterality: Right;  ? FEMUR IM NAIL Left 12/09/2019  ? Procedure: INTRAMEDULLARY (IM) NAIL FEMORAL;  Surgeon: Paralee Cancel, MD;  Location: [place];  Service: Orthopedics;  Laterality: Left;  ? THROMBECTOMY FEMORAL ARTERY Right 12/14/2021  ? Procedure: RIGHT LEG ARTERIAL THROMBECTOMY;  Surgeon: Cherre Robins, MD;  Location: [place];  Service: Vascular;  Laterality: Right;  ? ?Social History:  reports that she has never smoked. She has never used smokeless tobacco. She reports current alcohol use of about 1.0 standard drink per week. She reports that she does not use  drugs. ? ?Allergies  ?Allergen Reactions  ? Penicillins Hives and Shortness Of Breath  ? Azithromycin Hives  ? Drug Ingredient [Cephalexin] Hives  ? ? ?History reviewed. No pertinent family history. ? ?Prior to Admission medications   ?Medication Sig Start Date End Date Taking? Authorizing Provider  ?albuterol (VENTOLIN HFA) 108 (90 Base) MCG/ACT inhaler Inhale 2 puffs into the lungs every 6 (six) hours as needed for wheezing or shortness of breath. 12/06/21   [provider]  ?apixaban (ELIQUIS) 5 MG TABS tablet Take 1 tablet (5 mg total) by mouth 2 (two) times daily. 12/19/21   Kathie Dike, MD  ?clopidogrel (PLAVIX) 75 MG tablet Take 75 mg by mouth daily.    [provider]  ?empagliflozin (JARDIANCE) 10 MG TABS tablet Take 10 mg by mouth daily.    [provider]  ?ezetimibe (ZETIA) 10 MG tablet Take 10 mg by mouth daily. 11/16/19   [provider]  ?fluticasone (FLONASE) 50 MCG/ACT nasal spray Place 1 spray into both nostrils daily as needed for allergies. 06/25/20   [provider]  ?furosemide (LASIX) 40 MG tablet Take 20 mg by mouth daily.  10/17/19   [provider]  ?LANTUS SOLOSTAR 100 UNIT/ML Solostar Pen Inject 40 Units into the skin at bedtime. 12/19/21   Kathie Dike, MD  ?metFORMIN (GLUCOPHAGE) 500 MG tablet Take 500 mg by mouth 3 (three) times daily. 11/16/19   [provider]  ?methimazole (TAPAZOLE) 5 MG tablet Take 5 mg by mouth daily. 10/15/21   [provider]  ?metoprolol succinate (TOPROL-XL) 25 MG 24 hr tablet Take 1 tablet (25 mg total) by mouth 2 (two) times daily. 12/20/21   Kathie Dike, MD  ?MYRBETRIQ 50 MG TB24 tablet Take 50 mg by mouth daily. 10/09/19   [provider]  ?nitroGLYCERIN (NITROSTAT) 0.4 MG SL tablet Place 0.4 mg under the tongue every 5 (five) minutes as needed for chest pain.     [provider]  ?nystatin (MYCOSTATIN/NYSTOP) powder Apply 1 application topically 3 (three) times daily  as needed (rash).    [provider]  ?oxyCODONE-acetaminophen (PERCOCET) 5-325 MG tablet Take 1 tablet by mouth every 6 (six) hours as needed. 12/21/21   Rhyne, Hulen Shouts, PA-C  ?pantoprazole (PROTONIX) 40 MG tablet Take 40 mg by mouth daily. 10/17/19   [provider]  ?polyethylene glycol (MIRALAX / GLYCOLAX) 17 g packet Take 17 g by mouth daily. 12/21/21   Blue, Soijett A, PA-C  ?rosuvastatin (CRESTOR) 20 MG tablet Take 1 tablet (20 mg total) by mouth at bedtime. 12/20/21   Kathie Dike, MD  ?spironolactone (ALDACTONE) 25 MG tablet Take 12.5 mg by mouth daily. 10/05/21   [provider]  ?tiZANidine (ZANAFLEX) 4 MG tablet Take 4 mg by mouth 3 (three) times daily as needed for muscle spasms.  11/13/19   [provider]  ? ? ?Physical Exam: ?Vitals:  ? 02/14/22 1330 02/14/22 1400 02/14/22 1503 02/14/22 1505  ?BP: 117/73 129/78 (!) 138/93 (!) 138/93  ?Pulse: (!) 110 (!) 110 (!) 102 (!) 102  ?Resp: 16 16 16 20   ?Temp:      ?TempSrc:      ?SpO2: 96% 100% 98% 99%  ? ?General: 71 y.o. female resting in bed in NAD ?Eyes: PERRL, normal sclera ?ENMT: Nares patent w/o discharge, orophaynx clear, dentition normal, ears w/o discharge/lesions/ulcers ?Neck: Supple, trachea midline ?Cardiovascular: RRR, +S1, S2, no m/g/r, equal pulses throughout ?Respiratory: scattered rhonchi, no w/r, normal WOB ?GI: BS+, NDNT, no organomegaly noted, right pannus wound w/ some drainage ?MSK: No e/c/c ?Neuro: A&O x 3, no focal deficits ?Psyc: Appropriate interaction and affect, calm/cooperative ? ?Data Reviewed: ? ?K+  2.8 ?Glucose 337 ?Calcium  7.4 ?Albumin 2.4 ?Lactic acid 3.4 ?WBC 9.6 ?Hgb  10.2 ? ?CT ab/pelvis: ?No acute abnormality seen in the abdomen or pelvis. ?Aortic Atherosclerosis (ICD10-I70.0). ? ?CXR:  ?Mild right upper lobe airspace opacity is noted concerning for ?possible pneumonia. Followup PA and lateral chest X-ray is ?recommended in 3-4 weeks following trial of antibiotic therapy to ?ensure  resolution and exclude underlying malignancy. ? ?Assessment and Plan: ?No notes have been filed under this hospital service. ?Service: Hospitalist ?[facility] PNA ?SIRS ?    - admit to inpt, tele ?    - recent admission, so will cover for HCAP; continue lvq ?    - lactic acid is up; give fluids, follow ?    - check MRSA, urine legionella, urine strep ?    - guaifenesin, PRN nebs ? ?Chronic pannus wound ?    - WOCN ?    - there is some drainage, but CT ab/pelvis didn't show anything remarkable ? ?Hypokalemia ?Hypocalcemia ?    - replace K+, check Mg2+ ?    - Ca2+ corrects to 8.7; replace ? ?DM2, uncontrolled ?    - A1c, SSI, glucose checks, DM diet ? ?Hx of DVT ?    - continue eliquis ? ?HTN ?    - continue home regimen when confirmed ? ?PAF ?    -  continue home regimen when confirmed ? ?CAD ?HLD ?    - continue home regimen when confirmed ? ?Hyperthyroidism ?    - continue home regimen when confirmed ? ? Advance Care Planning:   Code Status: FULL ? ?Consults: None ? ?Family Communication: None at bedside ? ?Severity of Illness: ?The appropriate patient status for this patient is INPATIENT. Inpatient status is judged to be reasonable and necessary in order to provide the required intensity of service to ensure the patient's safety. The patient's presenting symptoms, physical exam findings, and initial radiographic and laboratory data in the context of their chronic comorbidities is felt to place them at high risk for further clinical deterioration. Furthermore, it is not anticipated that the patient will be medically stable for discharge from the hospital within 2 midnights of admission.  ? ?* I certify that at the point of admission it is my clinical judgment that the patient will require inpatient hospital care spanning beyond 2 midnights from the point of admission due to high intensity of service, high risk for further deterioration and high frequency of surveillance required.* ? ?Author: ?Jonnie Finner, DO ?02/14/2022  3:07 PM ? ?For on call review [email or web address].  ?

## 2022-02-14 NOTE — ED Triage Notes (Signed)
EMS reports from home, called out for weakness and hyperglycemia. Seen at [facility] for DVT last month, post vascular lower leg and lower abdomin surgery. Did not take Diabetes meds today. ? ?BP 120/62 ?HR 104 ?RR 20 ?Sp02 94 RA ?CBG 516 (462 after bolus) ? ?20 L wrist ? ?1045ml NS enroute ?

## 2022-02-14 NOTE — ED Provider Notes (Signed)
?[facility]-EMERGENCY DEPT ?Provider Note ? ? ?CSN: [id number] ?Arrival date & time: 02/14/22  1149 ? ?  ? ?History ? ?Chief Complaint  ?Patient presents with  ? Weakness  ? Hyperglycemia  ? ? ?Kelly Lee is a 71 y.o. female. ? ?71 year old female presents with whole body weakness URI symptoms times several days.  Says noted some drainage from a chronic wound in her right groin.  Denies any fevers or chills.  Cough but no severe shortness of breath.  No urinary symptoms.  No vomiting or diarrhea.  Patient known to be hyperglycemic and states that she takes insulin as well as oral medications for her diabetes and that the dose has not been changed.  States compliance with the medications.  Called EMS today and the patient's blood sugar was 516.  Was given a liter of saline and transported here. ? ? ?  ? ?Home Medications ?Prior to Admission medications   ?Medication Sig Start Date End Date Taking? Authorizing Provider  ?albuterol (VENTOLIN HFA) 108 (90 Base) MCG/ACT inhaler Inhale 2 puffs into the lungs every 6 (six) hours as needed for wheezing or shortness of breath. 12/06/21   [provider]  ?apixaban (ELIQUIS) 5 MG TABS tablet Take 1 tablet (5 mg total) by mouth 2 (two) times daily. 12/19/21   Erick Blinks, MD  ?clopidogrel (PLAVIX) 75 MG tablet Take 75 mg by mouth daily.    [provider]  ?empagliflozin (JARDIANCE) 10 MG TABS tablet Take 10 mg by mouth daily.    [provider]  ?ezetimibe (ZETIA) 10 MG tablet Take 10 mg by mouth daily. 11/16/19   [provider]  ?fluticasone (FLONASE) 50 MCG/ACT nasal spray Place 1 spray into both nostrils daily as needed for allergies. 06/25/20   [provider]  ?furosemide (LASIX) 40 MG tablet Take 20 mg by mouth daily.  10/17/19   [provider]  ?LANTUS SOLOSTAR 100 UNIT/ML Solostar Pen Inject 40 Units into the skin at bedtime. 12/19/21   Erick Blinks, MD  ?metFORMIN (GLUCOPHAGE) 500 MG tablet  Take 500 mg by mouth 3 (three) times daily. 11/16/19   [provider]  ?methimazole (TAPAZOLE) 5 MG tablet Take 5 mg by mouth daily. 10/15/21   [provider]  ?metoprolol succinate (TOPROL-XL) 25 MG 24 hr tablet Take 1 tablet (25 mg total) by mouth 2 (two) times daily. 12/20/21   Erick Blinks, MD  ?MYRBETRIQ 50 MG TB24 tablet Take 50 mg by mouth daily. 10/09/19   [provider]  ?nitroGLYCERIN (NITROSTAT) 0.4 MG SL tablet Place 0.4 mg under the tongue every 5 (five) minutes as needed for chest pain.     [provider]  ?nystatin (MYCOSTATIN/NYSTOP) powder Apply 1 application topically 3 (three) times daily as needed (rash).    [provider]  ?oxyCODONE-acetaminophen (PERCOCET) 5-325 MG tablet Take 1 tablet by mouth every 6 (six) hours as needed. 12/21/21   Rhyne, Ames Coupe, PA-C  ?pantoprazole (PROTONIX) 40 MG tablet Take 40 mg by mouth daily. 10/17/19   [provider]  ?polyethylene glycol (MIRALAX / GLYCOLAX) 17 g packet Take 17 g by mouth daily. 12/21/21   Blue, Soijett A, PA-C  ?rosuvastatin (CRESTOR) 20 MG tablet Take 1 tablet (20 mg total) by mouth at bedtime. 12/20/21   Erick Blinks, MD  ?spironolactone (ALDACTONE) 25 MG tablet Take 12.5 mg by mouth daily. 10/05/21   [provider]  ?tiZANidine (ZANAFLEX) 4 MG tablet Take 4 mg by mouth 3 (  three) times daily as needed for muscle spasms.  11/13/19   [provider]  ?   ? ?Allergies    ?Penicillins, Azithromycin, and Drug ingredient [cephalexin]   ? ?Review of Systems   ?Review of Systems  ?All other systems reviewed and are negative. ? ?Physical Exam ?Updated Vital Signs ?BP 116/73   Pulse (!) 102   Temp 98 ?F (36.7 ?C) (Oral)   Resp 16   SpO2 96%  ?Physical Exam ?Vitals and nursing note reviewed.  ?Constitutional:   ?   General: She is not in acute distress. ?   Appearance: Normal appearance. She is well-developed. She is not toxic-appearing.  ?HENT:  ?   Head: Normocephalic and  atraumatic.  ?Eyes:  ?   General: Lids are normal.  ?   Conjunctiva/sclera: Conjunctivae normal.  ?   Pupils: Pupils are equal, round, and reactive to light.  ?Neck:  ?   Thyroid: No thyroid mass.  ?   Trachea: No tracheal deviation.  ?Cardiovascular:  ?   Rate and Rhythm: Normal rate and regular rhythm.  ?   Heart sounds: Normal heart sounds. No murmur heard. ?  No gallop.  ?Pulmonary:  ?   Effort: Pulmonary effort is normal. No respiratory distress.  ?   Breath sounds: Normal breath sounds. No stridor. No decreased breath sounds, wheezing, rhonchi or rales.  ?Abdominal:  ?   General: There is no distension.  ?   Palpations: Abdomen is soft.  ?   Tenderness: There is no abdominal tenderness. There is no rebound.  ? ? ?Musculoskeletal:     ?   General: No tenderness. Normal range of motion.  ?   Cervical back: Normal range of motion and neck supple.  ?Skin: ?   General: Skin is warm and dry.  ?   Findings: No abrasion or rash.  ?Neurological:  ?   Mental Status: She is alert and oriented to person, place, and time. Mental status is at baseline.  ?   GCS: GCS eye subscore is 4. GCS verbal subscore is 5. GCS motor subscore is 6.  ?   Cranial Nerves: No cranial nerve deficit.  ?   Sensory: No sensory deficit.  ?   Motor: Motor function is intact.  ?Psychiatric:     ?   Attention and Perception: Attention normal.     ?   Speech: Speech normal.     ?   Behavior: Behavior normal.  ? ? ?ED Results / Procedures / Treatments   ?Labs ?(all labs ordered are listed, but only abnormal results are displayed) ?Labs Reviewed  ?CBC WITH DIFFERENTIAL/PLATELET - Abnormal; Notable for the following components:  ?    Result Value  ? RBC 3.76 (*)   ? Hemoglobin 10.2 (*)   ? HCT 32.0 (*)   ? All other components within normal limits  ?CBG MONITORING, ED - Abnormal; Notable for the following components:  ? Glucose-Capillary 376 (*)   ? All other components within normal limits  ?RESP PANEL BY RT-PCR (FLU A&B, COVID) ARPGX2  ?URINALYSIS,  ROUTINE W REFLEX MICROSCOPIC  ?COMPREHENSIVE METABOLIC PANEL  ? ? ?EKG ?None ? ?Radiology ?No results found. ? ?Procedures ?Procedures  ? ? ?Medications Ordered in ED ?Medications  ?lactated ringers infusion (has no administration in time range)  ?insulin aspart (novoLOG) injection 10 Units (has no administration in time range)  ?lactated ringers bolus 500 mL (500 mLs Intravenous New Bag/Given 02/14/22 1240)  ? ? ?ED Course/ Medical  Decision Making/ A&P ?  ?                        ?Medical Decision Making ?Amount and/or Complexity of Data Reviewed ?Labs: ordered. ?Radiology: ordered. ?ECG/medicine tests: ordered. ? ?Risk ?Prescription drug management. ? ? ?Patient presented with cough congestion URI symptoms.  Chest x-ray shows evidence of pneumonia.  This is per my review and interpretation.  Patient has chronic wound in her right groin and abdominal CT per my review and interpretation did not show any deep abscess.  Was hyperglycemic here and given insulin as well as IV fluids.  Blood sugar was 372.  Patient's COVID and flu test were negative.  Due to patient's hyperglycemia and weakness plan will be for admission.  Patient started on IV Levaquin.  Will consult hospitalist for admission ? ? ? ? ? ? ? ?Final Clinical Impression(s) / ED Diagnoses ?Final diagnoses:  ?None  ? ? ?Rx / DC Orders ?ED Discharge Orders   ? ? None  ? ?  ? ? ?  ?Lorre NickAllen, Shaquavia Whisonant, MD ?02/14/22 1456 ? ?

## 2022-02-15 ENCOUNTER — Inpatient Hospital Stay (HOSPITAL_COMMUNITY): Payer: Medicare Other

## 2022-02-15 DIAGNOSIS — Z7901 Long term (current) use of anticoagulants: Secondary | ICD-10-CM

## 2022-02-15 DIAGNOSIS — J189 Pneumonia, unspecified organism: Secondary | ICD-10-CM | POA: Diagnosis not present

## 2022-02-15 DIAGNOSIS — R7881 Bacteremia: Secondary | ICD-10-CM

## 2022-02-15 LAB — ECHOCARDIOGRAM COMPLETE
Area-P 1/2: 5.84 cm2
Calc EF: 61.4 %
Height: 61 in
MV VTI: 1.87 cm2
S' Lateral: 4.2 cm
Single Plane A2C EF: 67.7 %
Single Plane A4C EF: 53.4 %
Weight: 3051.17 oz

## 2022-02-15 LAB — GLUCOSE, CAPILLARY
Glucose-Capillary: 100 mg/dL — ABNORMAL HIGH (ref 70–99)
Glucose-Capillary: 182 mg/dL — ABNORMAL HIGH (ref 70–99)
Glucose-Capillary: 233 mg/dL — ABNORMAL HIGH (ref 70–99)
Glucose-Capillary: 208 mg/dL — ABNORMAL HIGH (ref 70–99)
Glucose-Capillary: 202 mg/dL — ABNORMAL HIGH (ref 70–99)
Glucose-Capillary: 233 mg/dL — ABNORMAL HIGH (ref 70–99)

## 2022-02-15 LAB — PHOSPHORUS: Phosphorus: 1.9 mg/dL — ABNORMAL LOW (ref 2.5–4.6)

## 2022-02-15 LAB — BLOOD CULTURE ID PANEL (REFLEXED) - BCID2

## 2022-02-15 LAB — BASIC METABOLIC PANEL
Anion gap: 5 (ref 5–15)
BUN: 5 mg/dL — ABNORMAL LOW (ref 8–23)
CO2: 26 mmol/L (ref 22–32)
Calcium: 8.4 mg/dL — ABNORMAL LOW (ref 8.9–10.3)
Chloride: 105 mmol/L (ref 98–111)
Creatinine, Ser: 0.58 mg/dL (ref 0.44–1.00)
GFR, Estimated: >60 mL/min (ref >60)
Glucose, Bld: 248 mg/dL — ABNORMAL HIGH (ref 70–99)
Potassium: 3.9 mmol/L (ref 3.5–5.1)
Sodium: 136 mmol/L (ref 135–145)

## 2022-02-15 LAB — URINALYSIS, ROUTINE W REFLEX MICROSCOPIC
Bacteria, UA: NONE SEEN
Bilirubin Urine: NEGATIVE
Glucose, UA: >=500 mg/dL — AB
Ketones, ur: NEGATIVE mg/dL
Nitrite: NEGATIVE
Protein, ur: NEGATIVE mg/dL
Specific Gravity, Urine: 1.01 (ref 1.005–1.030)
WBC, UA: >50 WBC/hpf — ABNORMAL HIGH (ref 0–5)
pH: 6 (ref 5.0–8.0)

## 2022-02-15 LAB — HEMOGLOBIN A1C
Hgb A1c MFr Bld: 9.7 % — ABNORMAL HIGH (ref 4.8–5.6)
Mean Plasma Glucose: 232 mg/dL

## 2022-02-15 LAB — MAGNESIUM: Magnesium: 1.6 mg/dL — ABNORMAL LOW (ref 1.7–2.4)

## 2022-02-15 LAB — MRSA NEXT GEN BY PCR, NASAL: MRSA by PCR Next Gen: DETECTED — AB

## 2022-02-15 LAB — STREP PNEUMONIAE URINARY ANTIGEN: Strep Pneumo Urinary Antigen: NEGATIVE

## 2022-02-15 MED ORDER — INSULIN GLARGINE 100 UNIT/ML ~~LOC~~ SOLN
40.0000 [IU] | Freq: Every day | SUBCUTANEOUS | Status: DC
  Filled 2022-02-15: qty 0.4

## 2022-02-15 MED ORDER — METHIMAZOLE 5 MG PO TABS
5.0000 mg | ORAL_TABLET | Freq: Every day | ORAL | Status: DC
  Administered 2022-02-15 – 2022-03-02 (×16): 5 mg via ORAL
  Filled 2022-02-15 (×16): qty 1

## 2022-02-15 MED ORDER — HYDROCOD POLI-CHLORPHE POLI ER 10-8 MG/5ML PO SUER
5.0000 mL | Freq: Every evening | ORAL | Status: DC | PRN
  Administered 2022-02-15 – 2022-02-22 (×5): 5 mL via ORAL
  Filled 2022-02-15 (×5): qty 5

## 2022-02-15 MED ORDER — METOPROLOL SUCCINATE ER 25 MG PO TB24
25.0000 mg | ORAL_TABLET | Freq: Two times a day (BID) | ORAL | Status: DC
Start: 2022-02-15 — End: 2022-02-17
  Administered 2022-02-15 – 2022-02-16 (×2): 25 mg via ORAL
  Filled 2022-02-15 (×3): qty 1

## 2022-02-15 MED ORDER — SPIRONOLACTONE 12.5 MG HALF TABLET: 12.5000 mg | ORAL_TABLET | Freq: Every day | ORAL | Status: DC

## 2022-02-15 MED ORDER — METOPROLOL TARTRATE 5 MG/5ML IV SOLN
2.5000 mg | Freq: Once | INTRAVENOUS | Status: AC | PRN
  Administered 2022-02-15: 2.5 mg via INTRAVENOUS
  Filled 2022-02-15: qty 5

## 2022-02-15 MED ORDER — SODIUM PHOSPHATES 45 MMOLE/15ML IV SOLN
15.0000 mmol | Freq: Once | INTRAVENOUS | Status: AC
  Administered 2022-02-15: 15 mmol via INTRAVENOUS
  Filled 2022-02-15: qty 5

## 2022-02-15 MED ORDER — INSULIN GLARGINE-YFGN 100 UNIT/ML ~~LOC~~ SOLN
40.0000 [IU] | Freq: Every day | SUBCUTANEOUS | Status: DC
  Administered 2022-02-15 – 2022-02-17 (×3): 40 [IU] via SUBCUTANEOUS
  Filled 2022-02-15 (×3): qty 0.4

## 2022-02-15 MED ORDER — EZETIMIBE 10 MG PO TABS
10.0000 mg | ORAL_TABLET | Freq: Every day | ORAL | Status: DC
  Administered 2022-02-15 – 2022-03-01 (×15): 10 mg via ORAL
  Filled 2022-02-15 (×16): qty 1

## 2022-02-15 MED ORDER — SPIRONOLACTONE 12.5 MG HALF TABLET
12.5000 mg | ORAL_TABLET | Freq: Every day | ORAL | Status: DC
  Filled 2022-02-15: qty 1

## 2022-02-15 MED ORDER — PANTOPRAZOLE SODIUM 40 MG PO TBEC
40.0000 mg | DELAYED_RELEASE_TABLET | Freq: Every day | ORAL | Status: DC
  Administered 2022-02-16 – 2022-03-02 (×15): 40 mg via ORAL
  Filled 2022-02-15 (×15): qty 1

## 2022-02-15 MED ORDER — PERFLUTREN LIPID MICROSPHERE
1.0000 mL | INTRAVENOUS | Status: AC | PRN
  Administered 2022-02-15: 2 mL via INTRAVENOUS
  Filled 2022-02-15: qty 10

## 2022-02-15 MED ORDER — METOPROLOL TARTRATE 5 MG/5ML IV SOLN
2.0000 mg | Freq: Once | INTRAVENOUS | Status: AC | PRN
  Administered 2022-02-15: 2 mg via INTRAVENOUS
  Filled 2022-02-15: qty 5

## 2022-02-15 MED ORDER — CEFAZOLIN SODIUM-DEXTROSE 2-4 GM/100ML-% IV SOLN
2.0000 g | Freq: Three times a day (TID) | INTRAVENOUS | Status: DC
Start: 2022-02-15 — End: 2022-02-19
  Administered 2022-02-15 – 2022-02-18 (×11): 2 g via INTRAVENOUS
  Filled 2022-02-15 (×13): qty 100

## 2022-02-15 MED ORDER — MAGNESIUM SULFATE 2 GM/50ML IV SOLN
2.0000 g | Freq: Once | INTRAVENOUS | Status: AC
  Administered 2022-02-15: 2 g via INTRAVENOUS
  Filled 2022-02-15: qty 50

## 2022-02-15 MED ORDER — FUROSEMIDE 40 MG PO TABS
40.0000 mg | ORAL_TABLET | Freq: Every day | ORAL | Status: DC
Start: 2022-02-15 — End: 2022-02-16
  Administered 2022-02-15: 40 mg via ORAL
  Filled 2022-02-15: qty 1

## 2022-02-15 MED ORDER — PRAVASTATIN SODIUM 20 MG PO TABS
40.0000 mg | ORAL_TABLET | Freq: Every day | ORAL | Status: DC
Start: 2022-02-15 — End: 2022-02-19
  Administered 2022-02-15 – 2022-02-18 (×4): 40 mg via ORAL
  Filled 2022-02-15 (×4): qty 1

## 2022-02-15 NOTE — Hospital Course (Addendum)
71 y.o. female with medical history significant of HTN, DM2. Presenting with weakness. She reports that she has felt weak for at least the last 4 days. She has had poor PO intake during that time. She has had nausea, but no vomiting. She reports productive cough, but no fever. She tried APAP, advil and allergy meds to help; however, they didn't work. In addition, she has notice some draining from her pannus wound. When she didn't feel better today, she called EMS to take her to the ED for evaluation. ?Patient was treated for hypotension/septic shock,right upper lobe pneumonia sepsis MSSA suspected from pneumonia-TTE and TEE without evidence of vegetation seen by ID, advised antibiotics through April 19 to complete 14 days course.  Patient with chronic pannus wound also noted macular rash unsure etiology.  Patient completed IV antibiotics/19, patient is medically stable and is being discharged home with home health ?

## 2022-02-15 NOTE — Consult Note (Addendum)
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 Both of them looks superficial and non infected with pink granulation tissue.  No known hardware or implants  # RUL Opacity/Possible PNA- comfortable on room air. Likely MSSA  # Rt groin wound/Rt lower leg wound in the setting of arterial thrombectomy and rt calf compartment fasciotomy  on 12/15/21 for acute RLE ischemia  H/o MSSA bacteremia 2/2 rt wrist abscess s/p I and D and 4 weeks of cefazolin in Jan -Feb 2022  Recommendations  Will switch abtx to cefazolin Repeat blood cultures 2 sets tomorrow am  TTE  Monitor for metastatic sites of infection Monitor CBC and BMP Following  Rest of the management as per the primary team. Please call with questions or concerns.  Thank you for the consult  Odette Fraction, MD Infectious Disease Physician [facility] [address] [place], [place] [address] Phone: [phone]  Fax:  [phone]  __________________________________________________________________________________________________________ HPI and Hospital Course: 71 Y O Female with h/o HTN, DM2, HLD, DVT/PAF on AC, Hyperthyroidism, CAD s/p CABG, CHF, h/o MSSA bacteremia 2/2 rt wrist abscess s/p I and D and 4 weeks of cefazolin in Jan -Feb 2022, rt groin and lower leg wound 2/2 rt LE arterial thrombectomy and rt calf  4 compartment fasciotomy in 12/2021 who presented to the ED on 4/4 with generalized weakness , URI symptoms and drainage from the rt groin wound for few days PTA. She has a wound care nurse that comes in every week and was told that the wound in her rt groin does not look good. Denies fevers, chills. Cough/congestion +. Poor po intake with nausea but no vomiting. BG 516 by EMS. She lives with her daughter and her boyfriend. Denies smoking, alcohol and recreational drugs  At ED, afebrile, no leukocytosis  Labs remarkable for K 2.8, BG 337 Influenza A and B, SARscov2/resp panel negative   Imagings as below  ROS: all systems reviewed with pertinent positives and negatives as listed above   Past Medical History:  Diagnosis Date   Closed intertrochanteric fracture, left, initial encounter (HCC) 12/08/2019   Diabetes mellitus without complication (HCC)    Hypertension    Past Surgical History:  Procedure Laterality Date   CARDIAC CATHETERIZATION  CHOLECYSTECTOMY     FASCIOTOMY Right 12/14/2021   Procedure: RIGHT LOWER FASCIOTOMY;  Surgeon: Leonie Douglas, MD;  Location: [facility] OR;  Service: Vascular;  Laterality: Right;   FEMUR IM NAIL Left 12/09/2019   Procedure: INTRAMEDULLARY (IM) NAIL FEMORAL;  Surgeon: Durene Romans, MD;  Location[facility] OR;  Service: Orthopedics;  Laterality: Left;   THROMBECTOMY FEMORAL ARTERY Right 12/14/2021   Procedure: RIGHT LEG ARTERIAL THROMBECTOMY;  Surgeon: Leonie Douglas, MD;  Location: [facility] OR;  Service: Vascular;  Laterality: Right;   '  Scheduled Meds:  apixaban  5  mg Oral BID   calcium carbonate  1 tablet Oral BID   guaiFENesin  600 mg Oral BID   insulin aspart  0-20 Units Subcutaneous TID WC   insulin aspart  0-5 Units Subcutaneous QHS   potassium chloride  40 mEq Oral Daily   Continuous Infusions:  lactated ringers 125 mL/hr at 02/15/22 0815   levofloxacin (LEVAQUIN) IV     magnesium sulfate bolus IVPB     sodium phosphate  Dextrose 5% IVPB     PRN Meds:.acetaminophen **OR** acetaminophen, albuterol, chlorpheniramine-HYDROcodone, guaiFENesin-dextromethorphan, ondansetron **OR** ondansetron (ZOFRAN) IV  Allergies  Allergen Reactions   Penicillins Hives and Shortness Of Breath   Azithromycin Hives   Cephalexin Hives    Tolerated Ancef 2022    Silvadene [Silver Sulfadiazine] Other (See Comments)    "Burns" the skin   Wound Dressing Adhesive Rash   Social History   Socioeconomic History   Marital status: Widowed    Spouse name: Not on file   Number of children: Not on file   Years of education: Not on file   Highest education level: Not on file  Occupational History   Not on file  Tobacco Use   Smoking status: Never   Smokeless tobacco: Never  Substance and Sexual Activity   Alcohol use: Yes    Alcohol/week: 1.0 standard drink    Types: 1 Standard drinks or equivalent per week   Drug use: Never   Sexual activity: Not on file  Other Topics Concern   Not on file  Social History Narrative   Not on file   Social Determinants of Health   Financial Resource Strain: Not on file  Food Insecurity: Not on file  Transportation Needs: Not on file  Physical Activity: Not on file  Stress: Not on file  Social Connections: Not on file  Intimate Partner Violence: Not on file   Breast Cancer-relatedfamily history is not on file.  Vitals  BP 109/73 (BP Location: Right Arm)   Pulse (!) 115   Temp 98.2 F (36.8 C) (Oral)   Resp 18   Ht 5\' 1"  (1.549 m)   Wt 86.5 kg   SpO2 96%   BMI 36.03 kg/m    Physical Exam Constitutional:   sitting up in the chair and looks comfortable    Comments:   Cardiovascular:     Rate and Rhythm: Normal rate and regular rhythm.     Heart sounds:   Pulmonary:     Effort: Pulmonary effort is normal.     Comments: bilateral equal air entry, rales +  Abdominal:     Palpations: Abdomen is soft.     Tenderness: nontender and non distended, rt groin wound appears superficial and pink with no signs of infection. Rt lower leg wound is also pink with no signs of cellulitis or infection  Musculoskeletal:        General: No swelling or tenderness.  Skin:    Comments:   Neurological:     General: Grossly non focal, awake, alert and oriented    Psychiatric:        Mood and Affect: Mood normal.    Pertinent Microbiology Results for orders placed or performed during the hospital encounter of 02/14/22  Resp Panel by RT-PCR (Flu A&B, Covid) Nasopharyngeal Swab     Status: None   Collection Time: 02/14/22  1:45 PM   Specimen: Nasopharyngeal Swab; Nasopharyngeal(NP) swabs in vial transport medium  Result Value Ref Range Status   SARS Coronavirus 2 by RT PCR NEGATIVE NEGATIVE Final    Comment: (NOTE) SARS-CoV-2 target nucleic acids are NOT DETECTED.  The SARS-CoV-2 RNA is generally detectable in upper respiratory specimens during the acute phase of infection. The lowest concentration of SARS-CoV-2 viral copies this assay can detect is 138 copies/mL. A negative result does not preclude SARS-Cov-2 infection and should not be used as the sole basis for treatment or other patient management decisions. A negative result may occur with  improper specimen collection/handling, submission of specimen other than nasopharyngeal swab, presence of viral mutation(s) within the areas targeted by this assay, and inadequate number of viral copies(<138 copies/mL). A negative result must be combined with clinical observations, patient history, and epidemiological information. The expected result is  Negative.  Fact Sheet for Patients:  BloggerCourse.com  Fact Sheet for Healthcare Providers:  SeriousBroker.it  This test is no t yet approved or cleared by the [place] FDA and  has been authorized for detection and/or diagnosis of SARS-CoV-2 by FDA under an Emergency Use Authorization (EUA). This EUA will remain  in effect (meaning this test can be used) for the duration of the COVID-19 declaration under Section 564(b)(1) of the Act, 21 U.S.C.section 360bbb-3(b)(1), unless the authorization is terminated  or revoked sooner.       Influenza A by PCR NEGATIVE NEGATIVE Final   Influenza B by PCR NEGATIVE NEGATIVE Final    Comment: (NOTE) The Xpert Xpress SARS-CoV-2/FLU/RSV plus assay is intended as an aid in the diagnosis of influenza from Nasopharyngeal swab specimens and should not be used as a sole basis for treatment. Nasal washings and aspirates are unacceptable for Xpert Xpress SARS-CoV-2/FLU/RSV testing.  Fact Sheet for Patients: BloggerCourse.com  Fact Sheet for Healthcare Providers: SeriousBroker.it  This test is not yet approved or cleared by the [place] FDA and has been authorized for detection and/or diagnosis of SARS-CoV-2 by FDA under an Emergency Use Authorization (EUA). This EUA will remain in effect (meaning this test can be used) for the duration of the COVID-19 declaration under Section 564(b)(1) of the Act, 21 U.S.C. section 360bbb-3(b)(1), unless the authorization is terminated or revoked.  Performed at [facility], [address], [place], [place] [address]   Culture, blood (Routine X 2) w Reflex to ID Panel     Status: None (Preliminary result)   Collection Time: 02/14/22  3:03 PM   Specimen: BLOOD  Result Value Ref Range Status   Specimen Description   Final    BLOOD BLOOD LEFT WRIST Performed at [facility], [address] [place], [place] [address]    Special Requests   Final    BOTTLES DRAWN AEROBIC AND ANAEROBIC Blood Culture adequate volume Performed at [facility], [address], [place], [place] [address]    Culture  Setup Time   Final    GRAM POSITIVE COCCI AEROBIC BOTTLE ONLY CRITICAL RESULT CALLED TO,  READ BACK BY AND VERIFIED WITH: PHARMD NICK GLOGOVAC ON 02/15/22 @ 11:35 BY DRT Performed at [facility], [address] [place], [place] [address]    Culture GRAM POSITIVE COCCI IN CLUSTERS  Final   Report Status PENDING  Incomplete  Culture, blood (Routine X 2) w Reflex to ID Panel     Status: None (Preliminary result)   Collection Time: 02/14/22  5:17 PM   Specimen: BLOOD  Result Value Ref Range Status   Specimen Description   Final    BLOOD Performed at [facility], [address] [place], [place] [address]    Special Requests   Final    IN PEDIATRIC BOTTLE Blood Culture results may not be optimal due to an inadequate volume of blood received in culture bottles   Culture   Final    NO GROWTH < 12 HOURS Performed at [facility], [address], [place], [place] [address]    Report Status PENDING  Incomplete    Pertinent Lab seen by me:    Latest Ref Rng & Units 02/14/2022   12:42 PM 12/21/2021    3:42 AM 12/20/2021   12:39 AM  CBC  WBC 4.0 - 10.5 K/uL 9.6   8.8   11.1    Hemoglobin 12.0 - 15.0 g/dL 91.4   9.9   78.2    Hematocrit 36.0 - 46.0 % 32.0   31.3   31.4    Platelets 150 - 400 K/uL 315   364   325        Latest Ref Rng & Units 02/15/2022    7:43 AM 02/14/2022    9:44 PM 02/14/2022    1:03 PM  CMP  Glucose 70 - 99 mg/dL 956   213   086    BUN 8 - 23 mg/dL 5   7   7     Creatinine 0.44 - 1.00 mg/dL 5.78   4.69   6.29    Sodium 135 - 145 mmol/L 136   133   135    Potassium 3.5 - 5.1 mmol/L 3.9   2.7   2.8    Chloride 98 - 111 mmol/L 105   101   106    CO2 22 - 32 mmol/L 26   27   25     Calcium 8.9  - 10.3 mg/dL 8.4   7.9   7.4    Total Protein 6.5 - 8.1 g/dL   6.6    Total Bilirubin 0.3 - 1.2 mg/dL   0.8    Alkaline Phos 38 - 126 U/L   72    AST 15 - 41 U/L   13    ALT 0 - 44 U/L   8       Pertinent Imagings/Other Imagings Plain films and CT images have been personally visualized and interpreted; radiology reports have been reviewed. Decision making incorporated into the Impression / Recommendations. CT Abdomen Pelvis Wo Contrast  Result Date: 02/14/2022 CLINICAL DATA:  Acute generalized abdominal pain. EXAM: CT ABDOMEN AND PELVIS WITHOUT CONTRAST TECHNIQUE: Multidetector CT imaging of the abdomen and pelvis was performed following the standard protocol without IV contrast. RADIATION DOSE REDUCTION: This exam was performed according to the departmental dose-optimization program which includes automated exposure control, adjustment of the mA and/or kV according to patient size and/or use of iterative reconstruction technique. COMPARISON:  November 22, 2020. FINDINGS: Lower chest: No acute abnormality. Hepatobiliary: No focal liver abnormality is  seen. Status post cholecystectomy. No biliary dilatation. Pancreas: Unremarkable. No pancreatic ductal dilatation or surrounding inflammatory changes. Spleen: Normal in size without focal abnormality. Adrenals/Urinary Tract: Adrenal glands are unremarkable. Kidneys are normal, without renal calculi, focal lesion, or hydronephrosis. Bladder is unremarkable. Stomach/Bowel: The stomach is unremarkable. There is no evidence of bowel obstruction or inflammation. Vascular/Lymphatic: Aortic atherosclerosis. No enlarged abdominal or pelvic lymph nodes. Reproductive: Uterus and bilateral adnexa are unremarkable. Other: Postsurgical changes are noted in the right inguinal region consistent with prior vascular surgery. No hernia or ascites is noted. Musculoskeletal: Probable old T12 and L3 compression fractures are noted. No acute abnormality is noted. IMPRESSION: No  acute abnormality seen in the abdomen or pelvis. Aortic Atherosclerosis (ICD10-I70.0). Electronically Signed   By: Lupita Raider M.D.   On: 02/14/2022 13:56   DG Chest Port 1 View  Result Date: 02/14/2022 CLINICAL DATA:  Cough. EXAM: PORTABLE CHEST 1 VIEW COMPARISON:  November 22, 2020. FINDINGS: Stable cardiomegaly. Status post coronary bypass graft. Old left rib fractures are noted. Mild right upper lobe airspace opacity is noted concerning for possible pneumonia IMPRESSION: Mild right upper lobe airspace opacity is noted concerning for possible pneumonia. Followup PA and lateral chest X-ray is recommended in 3-4 weeks following trial of antibiotic therapy to ensure resolution and exclude underlying malignancy. Electronically Signed   By: Lupita Raider M.D.   On: 02/14/2022 13:33    I spent more than 80 minutes for this patient encounter including review of prior medical records/discussing diagnostics and treatment plan with the patient/family/coordinate care with primary/other specialits with greater than 50% of time in face to face encounter.   Electronically signed by:   Odette Fraction, MD Infectious Disease Physician [facility] Pager: [phone]

## 2022-02-15 NOTE — Progress Notes (Incomplete)
?  Echocardiogram ?2D Echocardiogram has been performed. ? ?Kelly Lee ?02/15/2022, 3:37 PM ?

## 2022-02-15 NOTE — Progress Notes (Signed)
?  Progress Note ? ? ?Patient: Kelly Lee [id number] DOB: 03-May-1951 DOA: 02/14/2022     1 ?DOS: the patient was seen and examined on 02/15/2022 ?  ?Brief hospital course: ?71 y.o. female with medical history significant of HTN, DM2. Presenting with weakness. She reports that she has felt weak for at least the last 4 days. She has had poor PO intake during that time. She has had nausea, but no vomiting. She reports productive cough, but no fever. She tried APAP, advil and allergy meds to help; however, they didn't work. In addition, she has notice some draining from her pannus wound. When she didn't feel better today, she called EMS to take her to the ED for evaluation. ? ?Assessment and Plan: ?No notes have been filed under this hospital service. ?Service: Hospitalist ? ?RUL PNA with MSSA bacteremia ?SIRS ?-Initially continued on levaquin ?-CXR findings of mid-R upper lobe airspace opacity ?-No leukocytosis, afebrile ?-Remains tachycardic with HR in the mid-100's with tachypnea with RR in the mid-20's ?-Blood cx noted to be pos for 1/4 MSSA  ?-F/u on 2d echo. ID consulted and abx changed to ancef ?-Repeat blood cx tomorrow ?-repeat cbc in aM ?  ?Chronic pannus wound ?    - WOCN consulted ?    - suspect source of MSSA bacteremia ?  ?Hypokalemia ?Hypocalcemia ?    - Potassium and Ca replaced ? ?Hypomagnesemia ?-Will replace ?  ?DM2, uncontrolled ?    - A1c, SSI, glucose checks, DM diet ?  ?Hx of DVT ?    - continue eliquis ?  ?HTN ?    - cont metoprolol per home regimen  ?  ?PAF ?    - cont metoprolol per home regimen ?- HR in the mid-100's ?-cont eliquis, no evidence of acute blood loss ?  ?CAD ?HLD ?    - cont metoprolol, statin per home regimen ?-Denies chest pains ?  ?Hyperthyroidism ?    - cont methimazole per home regimen ?-Will check TSH in AM ?  ? ?  ? ?Subjective: Still complaining of cough, increased mucus ? ?Physical Exam: ?Vitals:  ? 02/15/22 0630 02/15/22 0841 02/15/22 0845 02/15/22 1244  ?BP: 108/69  109/73  (!) 123/92  ?Pulse: (!) 120 (!) 115  (!) 106  ?Resp: 16 18  20   ?Temp: (!) 97.3 ?F (36.3 ?C) 98.2 ?F (36.8 ?C)  98 ?F (36.7 ?C)  ?TempSrc: Oral Oral  Oral  ?SpO2: 93% 96%  99%  ?Weight:   86.5 kg   ?Height:   5\' 1"  (1.549 m)   ? ?General exam: Awake, laying in bed, in nad ?Respiratory system: Normal respiratory effort, actively coughing ?Cardiovascular system: regular rate, s1, s2 ?Gastrointestinal system: Soft, nondistended, positive BS ?Central nervous system: CN2-12 grossly intact, strength intact ?Extremities: Perfused, no clubbing ?Skin: Normal skin turgor, no notable skin lesions seen ?Psychiatry: Mood normal // no visual hallucinations  ? ?Data Reviewed: ? ?Labs reviewed: K 3.9, Ca 8.4, , Mg 1.6 ? ?Family Communication: Pt in room, family not at bedside ? ?Disposition: ?Status is: Inpatient ?Remains inpatient appropriate because: Severity of illness ? Planned Discharge Destination: Home ? ? ? ? ?Author: ?Marylu Lund, MD ?02/15/2022 3:34 PM ? ?For on call review [email or web address].  ?

## 2022-02-15 NOTE — Consult Note (Addendum)
WOC Nurse Consult Note: ?Reason for Consult: Consult requested for right abd wound.  Pt states she had a hematoma removed to this site in Feb and the surgeon told her to use moist gauze packing daily.  She states it was almost healed, but has recently reopened and enlarged.  It is located in the abd fold and it is difficult to reduce the chronic pressure or moisture from the location surrounding the wound, which is red and macerated with patchy areas of partial thickness shallow skin loss.  ?Wound type: Chronic full thickness wound; 4X2X.5cm in the deepest area, 100% red and moist, mod amt tan drainage, no odor or fluctuance. ?Dressing procedure/placement/frequency: Continue previously ordered plan of care.  Topical treatment orders provided for bedside nurses to perform as follows: Apply moist 2X2 gauze to right wound Q day, using swab to fill, then cover with foam dressing.  (Change foam dressing Q 3 days or PRN soiling.) ?Pt should follow-up with her surgeon after discharge. ?Please re-consult if further assistance is needed.  Thank-you,  ?Julien Girt MSN, RN, [place], [place], CNS ?[phone]  ? ?  ?

## 2022-02-15 NOTE — Plan of Care (Signed)
Pt aox4, pleasant and cooperative with staff. Ambulatory with assist in room with [facility].  ?IV Abx, fluids per orders.  ? ?Problem: Activity: ?Goal: Ability to tolerate increased activity will improve ?Outcome: Progressing ?  ?Problem: Clinical Measurements: ?Goal: Ability to maintain a body temperature in the normal range will improve ?Outcome: Progressing ?  ?Problem: Respiratory: ?Goal: Ability to maintain adequate ventilation will improve ?Outcome: Progressing ?Goal: Ability to maintain a clear airway will improve ?Outcome: Progressing ?  ?Problem: Education: ?Goal: Knowledge of General Education information will improve ?Description: Including pain rating scale, medication(s)/side effects and non-pharmacologic comfort measures ?Outcome: Progressing ?  ?Problem: Health Behavior/Discharge Planning: ?Goal: Ability to manage health-related needs will improve ?Outcome: Progressing ?  ?Problem: Clinical Measurements: ?Goal: Ability to maintain clinical measurements within normal limits will improve ?Outcome: Progressing ?Goal: Will remain free from infection ?Outcome: Progressing ?Goal: Diagnostic test results will improve ?Outcome: Progressing ?Goal: Respiratory complications will improve ?Outcome: Progressing ?Goal: Cardiovascular complication will be avoided ?Outcome: Progressing ?  ?Problem: Activity: ?Goal: Risk for activity intolerance will decrease ?Outcome: Progressing ?  ?Problem: Nutrition: ?Goal: Adequate nutrition will be maintained ?Outcome: Progressing ?  ?Problem: Coping: ?Goal: Level of anxiety will decrease ?Outcome: Progressing ?  ?Problem: Elimination: ?Goal: Will not experience complications related to bowel motility ?Outcome: Progressing ?Goal: Will not experience complications related to urinary retention ?Outcome: Progressing ?  ?Problem: Pain Managment: ?Goal: General experience of comfort will improve ?Outcome: Progressing ?  ?Problem: Safety: ?Goal: Ability to remain free from injury will  improve ?Outcome: Progressing ?  ?Problem: Skin Integrity: ?Goal: Risk for impaired skin integrity will decrease ?Outcome: Progressing ?  ?

## 2022-02-15 NOTE — Progress Notes (Signed)
Inpatient Diabetes Program Recommendations ? ?AACE/ADA: New Consensus Statement on Inpatient Glycemic Control (2015) ? ?Target Ranges:  Prepandial:   less than 140 mg/dL ?     Peak postprandial:   less than 180 mg/dL (1-2 hours) ?     Critically ill patients:  140 - 180 mg/dL  ? ?Lab Results  ?Component Value Date  ? GLUCAP 208 (H) 02/15/2022  ? HGBA1C 9.7 (H) 02/14/2022  ? ? ?Review of Glycemic Control ? ?Diabetes history: DM2 ?Outpatient Diabetes medications: Lantus 40 QHS, metformin 500 mg TID ?Current orders for Inpatient glycemic control: Novolog 0-20 units TID with meals and 0-5 HS, Jardiance 10 mg QD ? ?HgbA1C - 9.7% ?Sees PCP every 3 months ?CBGs: 233, 208 mg/dL ?Will need part of home basal insulin ?PO intake much improved. ? ?Inpatient Diabetes Program Recommendations:   ? ?Add Semglee 15 units QHS ? ?Spoke with pt at bedside about her diabetes and HgbA1C of 9.7%. Pt states it's usually around 8%. Said she takes her insulin, Jardiance and metformin as prescribed and tries to eat healthy. Sees PCP every 3 months. Checks blood sugars at home. Answered all questions.  ? ?Thank you. ?Ailene Ards, RD, LDN, CDE ?Inpatient Diabetes Coordinator ?[phone]  ? ? ? ? ? ? ? ? ? ? ? ? ? ? ? ?

## 2022-02-15 NOTE — Progress Notes (Addendum)
PHARMACY - PHYSICIAN COMMUNICATION ?CRITICAL VALUE ALERT - BLOOD CULTURE IDENTIFICATION (BCID) ? ?Kelly Lee is an 71 y.o. female who presented to [facility] on 02/14/2022 with a chief complaint of weakness. Patient has a history of MSSA bacteremia in 2022 for which she followed up with [facility] and was treated with 4 weeks of IV Ancef.  ? ?Assessment:  1/3 Blood culture bottles with GPC, BCID w/ Staph aureus and no resistance gene detected ? ?Name of physician (or Provider) Contacted: Dr. Wyline Copas  ? ?Current antibiotics: Levaquin  ? ?Changes to prescribed antibiotics recommended:  ?Discontinue Levaquin ?Switch to Ancef 2g IV q8 hours ? ?Results for orders placed or performed during the hospital encounter of 02/14/22  ?Blood Culture ID Panel (Reflexed) (Collected: 02/14/2022  3:03 PM)  ?Result Value Ref Range  ? Enterococcus faecalis NOT DETECTED NOT DETECTED  ? Enterococcus Faecium NOT DETECTED NOT DETECTED  ? Listeria monocytogenes NOT DETECTED NOT DETECTED  ? Staphylococcus species DETECTED (A) NOT DETECTED  ? Staphylococcus aureus (BCID) DETECTED (A) NOT DETECTED  ? Staphylococcus epidermidis NOT DETECTED NOT DETECTED  ? Staphylococcus lugdunensis NOT DETECTED NOT DETECTED  ? Streptococcus species NOT DETECTED NOT DETECTED  ? Streptococcus agalactiae NOT DETECTED NOT DETECTED  ? Streptococcus pneumoniae NOT DETECTED NOT DETECTED  ? Streptococcus pyogenes NOT DETECTED NOT DETECTED  ? A.calcoaceticus-baumannii NOT DETECTED NOT DETECTED  ? Bacteroides fragilis NOT DETECTED NOT DETECTED  ? Enterobacterales NOT DETECTED NOT DETECTED  ? Enterobacter cloacae complex NOT DETECTED NOT DETECTED  ? Escherichia coli NOT DETECTED NOT DETECTED  ? Klebsiella aerogenes NOT DETECTED NOT DETECTED  ? Klebsiella oxytoca NOT DETECTED NOT DETECTED  ? Klebsiella pneumoniae NOT DETECTED NOT DETECTED  ? Proteus species NOT DETECTED NOT DETECTED  ? Salmonella species NOT DETECTED NOT DETECTED  ? Serratia marcescens NOT DETECTED NOT  DETECTED  ? Haemophilus influenzae NOT DETECTED NOT DETECTED  ? Neisseria meningitidis NOT DETECTED NOT DETECTED  ? Pseudomonas aeruginosa NOT DETECTED NOT DETECTED  ? Stenotrophomonas maltophilia NOT DETECTED NOT DETECTED  ? Candida albicans NOT DETECTED NOT DETECTED  ? Candida auris NOT DETECTED NOT DETECTED  ? Candida glabrata NOT DETECTED NOT DETECTED  ? Candida krusei NOT DETECTED NOT DETECTED  ? Candida parapsilosis NOT DETECTED NOT DETECTED  ? Candida tropicalis NOT DETECTED NOT DETECTED  ? Cryptococcus neoformans/gattii NOT DETECTED NOT DETECTED  ? Meth resistant mecA/C and MREJ NOT DETECTED NOT DETECTED  ? ? ?Dimple Nanas, PharmD ?02/15/2022 1:14 PM ? ?

## 2022-02-15 NOTE — Progress Notes (Signed)
?   02/15/22 0417 02/15/22 0630  ?Assess: MEWS Score  ?Temp 98.9 ?F (37.2 ?C) (!) 97.3 ?F (36.3 ?C)  ?BP 123/67 108/69  ?Pulse Rate (!) 124 (!) 120  ?Resp 20 16  ?SpO2  --  93 %  ?O2 Device  --  Room Air  ?Assess: MEWS Score  ?MEWS Temp 0 0  ?MEWS Systolic 0 0  ?MEWS Pulse 2 2  ?MEWS RR 0 0  ?MEWS LOC 0 0  ?MEWS Score 2 2  ?MEWS Score Color Yellow Yellow  ?Assess: if the MEWS score is Yellow or Red  ?Were vital signs taken at a resting state?  --  Yes  ?Focused Assessment  --  No change from prior assessment  ?Does the patient meet 2 or more of the SIRS criteria?  --  Yes  ?Does the patient have a confirmed or suspected source of infection?  --  Yes  ?MEWS guidelines implemented *See Row Information*  --  No, previously yellow, continue vital signs every 4 hours  ?Treat  ?Pain Scale  --  0-10  ?Pain Score  --  0  ?Take Vital Signs  ?Increase Vital Sign Frequency   --  Yellow: Q 2hr X 2 then Q 4hr X 2, if remains yellow, continue Q 4hrs  ?Escalate  ?MEWS: Escalate  --  Yellow: discuss with charge nurse/RN and consider discussing with provider and RRT  ?Notify: Charge Nurse/RN  ?Name of Charge Nurse/RN Notified  --  Renee, RN  ?Date Charge Nurse/RN Notified  --  02/15/22  ?Time Charge Nurse/RN Notified  --  0630  ?Notify: Provider  ?Provider Name/Title  --  Chinita Greenland, NP  ?Date Provider Notified  --  02/15/22  ?Time Provider Notified  --  0630  ?Notification Type  --  Page  ?Notification Reason  --  Other (Comment) ?(mews yellow)  ?Provider response  --  Other (Comment) ?(awaiting response)  ?Document  ?Patient Outcome  --  Stabilized after interventions  ?Progress note created (see row info)  --  Yes  ?Assess: SIRS CRITERIA  ?SIRS Temperature  0 0  ?SIRS Pulse 1 1  ?SIRS Respirations  0 0  ?SIRS WBC 0 0  ?SIRS Score Sum  1 1  ? ? ?

## 2022-02-15 NOTE — Clinical Social Work Note (Signed)
?  Transition of Care (TOC) Screening Note ? ? ?Patient Details  ?Name: Kelly Lee ?Date of Birth: Mar 01, 1951 ? ? ?Transition of Care (TOC) CM/SW Contact:    ?Ida Rogue, LCSW ?Phone Number: ?02/15/2022, 11:40 AM ? ? ? ?Transition of Care Department [facility]) has reviewed patient and no TOC needs have been identified at this time. We will continue to monitor patient advancement through interdisciplinary progression rounds. If new patient transition needs arise, please place a TOC consult. ? ? ?

## 2022-02-15 NOTE — Progress Notes (Signed)
?   02/15/22 0417  ?Assess: MEWS Score  ?Temp 98.9 ?F (37.2 ?C)  ?BP 123/67  ?Pulse Rate (!) 124  ?Resp 20  ?SpO2 94 %  ?O2 Device Room Air  ?Assess: MEWS Score  ?MEWS Temp 0  ?MEWS Systolic 0  ?MEWS Pulse 2  ?MEWS RR 0  ?MEWS LOC 0  ?MEWS Score 2  ?MEWS Score Color Yellow  ?Assess: if the MEWS score is Yellow or Red  ?Were vital signs taken at a resting state? Yes  ?Focused Assessment No change from prior assessment  ?Does the patient meet 2 or more of the SIRS criteria? Yes  ?Does the patient have a confirmed or suspected source of infection? Yes  ?MEWS guidelines implemented *See Row Information* Yes  ?Treat  ?Pain Scale 0-10  ?Pain Score 0  ?Take Vital Signs  ?Increase Vital Sign Frequency  Yellow: Q 2hr X 2 then Q 4hr X 2, if remains yellow, continue Q 4hrs  ?Escalate  ?MEWS: Escalate Yellow: discuss with charge nurse/RN and consider discussing with provider and RRT  ?Notify: Charge Nurse/RN  ?Name of Charge Nurse/RN Notified Renee, RN  ?Date Charge Nurse/RN Notified 02/15/22  ?Time Charge Nurse/RN Notified [phone]  ?Notify: Provider  ?Provider Name/Title Chinita Greenland, NP  ?Date Provider Notified 02/15/22  ?Time Provider Notified [phone]  ?Notification Type Page  ?Notification Reason Other (Comment) ?(mews yellow)  ?Provider response See new orders  ?Date of Provider Response 02/15/22  ?Time of Provider Response [phone]  ?Document  ?Patient Outcome Stabilized after interventions  ?Progress note created (see row info) Yes  ?Assess: SIRS CRITERIA  ?SIRS Temperature  0  ?SIRS Pulse 1  ?SIRS Respirations  0  ?SIRS WBC 0  ?SIRS Score Sum  1  ? ? ?

## 2022-02-15 NOTE — Progress Notes (Addendum)
Pharmacy Antibiotic Note ? ?Kelly Lee is a 71 y.o. female admitted on 02/14/2022 with weakness, found to have MSSA bacteremia.  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  Noted patient allergies to penicillins and cephalexin, however, patient was previously treated in Jan-Feb 2022 for MSSA bacteremia with IV cefazolin and tolerated.  Pharmacy has been consulted for cefazolin dosing. ? ?Plan: ?Discontinue Levaquin ?Begin cefazolin 2g IV q8 hours ?F/u culture data, renal function, further ID recommendations ? ?Height: 5\' 1"  (154.9 cm) ?Weight: 86.5 kg (190 lb 11.2 oz) ?IBW/kg (Calculated) : 47.8 ? ?Temp (24hrs), Avg:98.1 ?F (36.7 ?C), Min:97.3 ?F (36.3 ?C), Max:98.9 ?F (37.2 ?C) ? ?Recent Labs  ?Lab 02/14/22 ?1242 02/14/22 ?1303 02/14/22 ?1410 02/14/22 ?1731 02/14/22 ?1959 02/14/22 ?2144 02/15/22 ?0743  ?WBC 9.6  --   --   --   --   --   --   ?CREATININE  --  0.64  --   --   --  0.50 0.58  ?LATICACIDVEN  --   --  3.4* 1.9 1.5  --   --   ?  ?Estimated Creatinine Clearance: 64.5 mL/min (by C-G formula based on SCr of 0.58 mg/dL).   ? ?Allergies  ?Allergen Reactions  ? Penicillins Hives and Shortness Of Breath  ? Azithromycin Hives  ? Cephalexin Hives  ?  Tolerated Ancef 2022   ? Silvadene [Silver Sulfadiazine] Other (See Comments)  ?  "Burns" the skin  ? Wound Dressing Adhesive Rash  ? ? ?Antimicrobials this admission: ?Levaquin 4/4 >> 4/5 ?Cefazolin 4/5 >>  ? ?Dose adjustments this admission: ? ? ?Microbiology results: ?4/4 BCx: 1/3 bottles GPC's; BCID w/ Staph aureus no resistance ?4/5 MRSA PCR: detected ? ?Thank you for allowing pharmacy to be a part of this patient?s care. ? ?6/4, PharmD ?02/15/2022 12:53 PM ? ?

## 2022-02-16 DIAGNOSIS — R7881 Bacteremia: Secondary | ICD-10-CM | POA: Diagnosis not present

## 2022-02-16 DIAGNOSIS — J189 Pneumonia, unspecified organism: Secondary | ICD-10-CM | POA: Diagnosis not present

## 2022-02-16 DIAGNOSIS — R509 Fever, unspecified: Secondary | ICD-10-CM

## 2022-02-16 DIAGNOSIS — Z7901 Long term (current) use of anticoagulants: Secondary | ICD-10-CM | POA: Diagnosis not present

## 2022-02-16 LAB — CBC
HCT: 35 % — ABNORMAL LOW (ref 36.0–46.0)
Hemoglobin: 10.9 g/dL — ABNORMAL LOW (ref 12.0–15.0)
MCH: 27.4 pg (ref 26.0–34.0)
MCHC: 31.1 g/dL (ref 30.0–36.0)
MCV: 87.9 fL (ref 80.0–100.0)
Platelets: 328 10*3/uL (ref 150–400)
RBC: 3.98 MIL/uL (ref 3.87–5.11)
RDW: 15.4 % (ref 11.5–15.5)
WBC: 6.2 10*3/uL (ref 4.0–10.5)
nRBC: 0 % (ref 0.0–0.2)

## 2022-02-16 LAB — COMPREHENSIVE METABOLIC PANEL
ALT: 6 U/L (ref 0–44)
AST: 10 U/L — ABNORMAL LOW (ref 15–41)
Albumin: 2.4 g/dL — ABNORMAL LOW (ref 3.5–5.0)
Alkaline Phosphatase: 73 U/L (ref 38–126)
Anion gap: 8 (ref 5–15)
BUN: 5 mg/dL — ABNORMAL LOW (ref 8–23)
CO2: 25 mmol/L (ref 22–32)
Calcium: 8.4 mg/dL — ABNORMAL LOW (ref 8.9–10.3)
Chloride: 102 mmol/L (ref 98–111)
Creatinine, Ser: 0.54 mg/dL (ref 0.44–1.00)
GFR, Estimated: >60 mL/min (ref >60)
Glucose, Bld: 140 mg/dL — ABNORMAL HIGH (ref 70–99)
Potassium: 3.6 mmol/L (ref 3.5–5.1)
Sodium: 135 mmol/L (ref 135–145)
Total Bilirubin: 0.3 mg/dL (ref 0.3–1.2)
Total Protein: 6.7 g/dL (ref 6.5–8.1)

## 2022-02-16 LAB — LEGIONELLA PNEUMOPHILA SEROGP 1 UR AG: L. pneumophila Serogp 1 Ur Ag: NEGATIVE

## 2022-02-16 LAB — GLUCOSE, CAPILLARY
Glucose-Capillary: 113 mg/dL — ABNORMAL HIGH (ref 70–99)
Glucose-Capillary: 192 mg/dL — ABNORMAL HIGH (ref 70–99)

## 2022-02-16 LAB — PHOSPHORUS: Phosphorus: 2.4 mg/dL — ABNORMAL LOW (ref 2.5–4.6)

## 2022-02-16 LAB — MAGNESIUM: Magnesium: 1.6 mg/dL — ABNORMAL LOW (ref 1.7–2.4)

## 2022-02-16 LAB — TSH: TSH: 0.182 u[IU]/mL — ABNORMAL LOW (ref 0.350–4.500)

## 2022-02-16 MED ORDER — LACTATED RINGERS IV BOLUS
250.0000 mL | Freq: Once | INTRAVENOUS | Status: AC
  Administered 2022-02-16: 250 mL via INTRAVENOUS

## 2022-02-16 MED ORDER — LACTATED RINGERS IV SOLN: INTRAVENOUS | Status: DC

## 2022-02-16 MED ORDER — IBUPROFEN 200 MG PO TABS
400.0000 mg | ORAL_TABLET | ORAL | Status: DC | PRN
  Administered 2022-02-16 – 2022-02-18 (×2): 400 mg via ORAL
  Filled 2022-02-16 (×2): qty 2

## 2022-02-16 MED ORDER — MUPIROCIN 2 % EX OINT
1.0000 "application " | TOPICAL_OINTMENT | Freq: Two times a day (BID) | CUTANEOUS | Status: AC
  Administered 2022-02-16 – 2022-02-20 (×10): 1 via NASAL
  Filled 2022-02-16 (×2): qty 22

## 2022-02-16 MED ORDER — MAGNESIUM SULFATE 4 GM/100ML IV SOLN
4.0000 g | Freq: Once | INTRAVENOUS | Status: AC
  Administered 2022-02-16: 4 g via INTRAVENOUS
  Filled 2022-02-16: qty 100

## 2022-02-16 MED ORDER — CHLORHEXIDINE GLUCONATE CLOTH 2 % EX PADS
6.0000 | MEDICATED_PAD | Freq: Every day | CUTANEOUS | Status: DC
Start: 2022-02-17 — End: 2022-02-19
  Administered 2022-02-17 – 2022-02-18 (×2): 6 via TOPICAL

## 2022-02-16 MED ORDER — CLOPIDOGREL BISULFATE 75 MG PO TABS
75.0000 mg | ORAL_TABLET | Freq: Every day | ORAL | Status: DC
  Administered 2022-02-16 – 2022-03-02 (×15): 75 mg via ORAL
  Filled 2022-02-16 (×15): qty 1

## 2022-02-16 NOTE — Progress Notes (Signed)
?  Progress Note ? ? ?Patient: Kelly Lee [id number] DOB: November 11, 1951 DOA: 02/14/2022     2 ?DOS: the patient was seen and examined on 02/16/2022 ?  ?Brief hospital course: ?71 y.o. female with medical history significant of HTN, DM2. Presenting with weakness. She reports that she has felt weak for at least the last 4 days. She has had poor PO intake during that time. She has had nausea, but no vomiting. She reports productive cough, but no fever. She tried APAP, advil and allergy meds to help; however, they didn't work. In addition, she has notice some draining from her pannus wound. When she didn't feel better today, she called EMS to take her to the ED for evaluation. ? ?Assessment and Plan: ?No notes have been filed under this hospital service. ?Service: Hospitalist ? ?RUL PNA with MSSA bacteremia with sepsis present on admission ?-Initially continued on levaquin ?-CXR findings of mid-R upper lobe airspace opacity ?-No leukocytosis, afebrile ?-Remains tachycardic with HR in the mid-100's with tachypnea with RR in the mid-20's ?-Blood cx noted to be pos for 1/4 MSSA  ?-2d echo without evidence of vegetation ?-Follow up on repeat blood culture ?-repeat cbc in aM ?  ?Chronic pannus wound ?    - WOCN consulted ?    - suspect source of MSSA bacteremia ?  ?Hypokalemia ?Hypocalcemia ?    - Potassium and Ca replaced ? ?Hypomagnesemia ?-Remains low, will replace ?  ?DM2, uncontrolled ?    - A1c, SSI, glucose checks, DM diet ?  ?Hx of DVT ?    - continue eliquis ?  ?HTN ?    - cont metoprolol per home regimen  ?  ?PAF ?    - cont metoprolol per home regimen ?- HR in the mid-100's ?-cont eliquis, no evidence of acute blood loss ?-Repeat EKG performed and reviewed. Findings of sinus tach ?  ?CAD ?HLD ?    - cont metoprolol, statin per home regimen ?-Denies chest pains ?  ?Hyperthyroidism ?    - cont methimazole per home regimen ?-TSH 0.182 ?  ? ?  ? ?Subjective: Reports cough has improved. Feeling tired this AM ? ?Physical  Exam: ?Vitals:  ? 02/16/22 0955 02/16/22 1055 02/16/22 1155 02/16/22 1255  ?BP: 95/60 112/89 109/79 102/70  ?Pulse:  (!) 113  98  ?Resp: (!) 22 20 20 20   ?Temp: 99.9 ?F (37.7 ?C) (!) 100.4 ?F (38 ?C) 99.5 ?F (37.5 ?C) 98.1 ?F (36.7 ?C)  ?TempSrc: Rectal Rectal Rectal Oral  ?SpO2: 95% 97% 96% 93%  ?Weight:      ?Height:      ? ?General exam: Conversant, in no acute distress ?Respiratory system: normal chest rise, clear, no audible wheezing ?Cardiovascular system: regular rhythm, s1-s2 ?Gastrointestinal system: Nondistended, nontender, pos BS ?Central nervous system: No seizures, no tremors ?Extremities: No cyanosis, no joint deformities ?Skin: No rashes, no pallor ?Psychiatry: Affect normal // no auditory hallucinations  ? ?Data Reviewed: ? ?Labs reviewed: Mg 1.6 ? ?Family Communication: Pt in room, family not at bedside ? ?Disposition: ?Status is: Inpatient ?Remains inpatient appropriate because: Severity of illness ? Planned Discharge Destination: Home ? ? ? ? ?Author: ?Marylu Lund, MD ?02/16/2022 3:43 PM ? ?For on call review [email or web address].  ?

## 2022-02-16 NOTE — Progress Notes (Signed)
?   02/15/22 1956  ?Assess: MEWS Score  ?Temp 98.4 ?F (36.9 ?C)  ?BP 124/79  ?Pulse Rate (!) 126  ?Resp (!) 22  ?Level of Consciousness Alert  ?SpO2 96 %  ?O2 Device Room Air  ?Assess: MEWS Score  ?MEWS Temp 0  ?MEWS Systolic 0  ?MEWS Pulse 2  ?MEWS RR 1  ?MEWS LOC 0  ?MEWS Score 3  ?MEWS Score Color Yellow  ?Assess: if the MEWS score is Yellow or Red  ?Were vital signs taken at a resting state? Yes  ?Focused Assessment Change from prior assessment (see assessment flowsheet)  ?Does the patient meet 2 or more of the SIRS criteria? Yes  ?Does the patient have a confirmed or suspected source of infection? Yes  ?Provider and Rapid Response Notified? Yes  ?MEWS guidelines implemented *See Row Information* No, previously yellow, continue vital signs every 4 hours  ?Treat  ?Pain Scale 0-10  ?Pain Score 0  ?Notify: Charge Nurse/RN  ?Name of Charge Nurse/RN Notified Debbie  ?Date Charge Nurse/RN Notified 02/15/22  ?Time Charge Nurse/RN Notified 2002  ?Notify: Provider  ?Provider Name/Title Garner Nash  ?Date Provider Notified 02/15/22  ?Time Provider Notified 2002  ?Notification Type Page  ?Notification Reason Change in status  ?Provider response See new orders ?(instructed to give hs lopressor)  ?Date of Provider Response 02/15/22  ?Time of Provider Response 2020  ?Document  ?Patient Outcome Stabilized after interventions  ?Progress note created (see row info) Yes  ?Assess: SIRS CRITERIA  ?SIRS Temperature  0  ?SIRS Pulse 1  ?SIRS Respirations  1  ?SIRS WBC 0  ?SIRS Score Sum  2  ? ? ?

## 2022-02-16 NOTE — Progress Notes (Addendum)
At bedside for PIV start per consult.  RPFA PIV working well with LR bolus completed.  Pt denies discomfort or tenderness to site.  All meds compatible with LR.  Spoke with Sonne RN at room.  Will not place 2nd PIV at this time for 'just in case'.  RN aware VAS Team is available when needed. ?

## 2022-02-16 NOTE — Progress Notes (Signed)
? ?[facility] Infectious Diseases Follow Up Note ? ?Patient Identification: ?Patient Name: Kelly Lee MRN: [id number] [place] Date: 02/14/2022 11:50 AM ?Age: 71 y.o.Today's Date: 02/16/2022 ? ? ?Reason for Visit: bacteremia  ? ?Principal Problem: ?  PNA (pneumonia) ?Active Problems: ?  Diabetes mellitus without complication ([place]) ?  Hypertension ?  Anticoagulant long-term use ?  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  Dyslipidemia ?  Hyperthyroidism ?  PAF (paroxysmal atrial fibrillation) ([place]) ?  History of DVT (deep vein thrombosis) ?  Hypokalemia ?  Hypocalcemia ?  Community acquired pneumonia ? ? ?Antibiotics:  ?Levofloxacin 4/4 ?Cefazolin 4/5-c  ?  ?Lines/Hardware: left IM nail ? ?Interval Events: T max 101.2 with episode of tachycardis and tachypnea in the am  ? ? ?Assessment ?# MSSA 1/4 bottles - likely cause is her open wounds in the rt groin + rt lower leg +sacral ulcer. All of them looks superficial and non infected with pink granulation tissue. No concerns for metastatic sites of infection at this time. TTE 4/5 with no concerns for vegetations or endocarditis  ? ?# RUL Opacity/Possible PNA- comfortable on room air. Likely MSSA ?  ?# Rt groin wound/Rt lower leg wound in the setting of arterial thrombectomy and rt calf compartment fasciotomy  on 12/15/21 for acute RLE ischemia- superficial and non infected  ? ?# Sacral stage 2 ulcer: appears clean and non infected  ?  ?# H/o MSSA bacteremia 2/2 rt wrist abscess s/p I and D and 4 weeks of cefazolin in Jan -Feb 2022 ? ?Recommendations ?-Continue cefazolin as is  ?-Fu sensi of staph aureus and repeat blood cultures for clearance ?-We may be able to forego TEE if repeat blood cultures are negative, no further fevers in the setting of low grade uncomplicated bacteremia without indwelling intravascular devices. However, will defer to Dr Linus Salmons who  will follow patient from tomorrow  ?-Monitor for fevers,  metastatic sites of infection ?-Monitor CBC and BMP ? ?Rest of the management as per the primary team. ?Thank you for the consult. Please page with pertinent questions or concerns. ? ?______________________________________________________________________ ?Subjective ?patient seen and examined at the bedside.  ?She looks little tired than yesterday  ?Had fevers ? ?Vitals ?BP 112/89 (BP Location: Left Arm)   Pulse (!) 113   Temp (!) 100.4 ?F (38 ?C) (Rectal)   Resp 20   Ht 5\' 1"  (1.549 m)   Wt 86.5 kg   SpO2 97%   BMI 36.03 kg/m?  ? ?  ?Physical Exam ?Constitutional:  lying in the bed and appears tired  ?   Comments:  ? ?Cardiovascular:  ?   Rate and Rhythm: Normal rate and regular rhythm.  ?   Heart sounds:  ? ?Pulmonary:  ?   Effort: Pulmonary effort is normal on room air  ?   Comments:  ? ?Abdominal:  ?   Palpations: Abdomen is soft.  ?   Tenderness: non distended  ? ?Musculoskeletal:     ?   General: No swelling or tenderness. No signs of septic peripheral joints  ? ?Skin: ?   Comments:   ?Sacral stage 2 ulcer ( clean),  ?Rt groin wound appears superficial and pink with no signs of infection ?Rt lower leg wound is also pink with no signs of cellulitis or infection ? ?Neurological:  ?   General: Grossly nonfocal, awake alert and oriented ? ?Psychiatric:     ?   Mood and Affect: Mood normal.  ? ?  Pertinent Microbiology ?Results for orders placed or performed during the hospital encounter of 02/14/22  ?Resp Panel by RT-PCR (Flu A&B, Covid) Nasopharyngeal Swab     Status: None  ? Collection Time: 02/14/22  1:45 PM  ? Specimen: Nasopharyngeal Swab; Nasopharyngeal(NP) swabs in vial transport medium  ?Result Value Ref Range Status  ? SARS Coronavirus 2 by RT PCR NEGATIVE NEGATIVE Final  ?  Comment: (NOTE) ?SARS-CoV-2 target nucleic acids are NOT DETECTED. ? ?The SARS-CoV-2 RNA is generally detectable in upper respiratory ?specimens during the acute phase of  infection. The lowest ?concentration of SARS-CoV-2 viral copies this assay can detect is ?138 copies/mL. A negative result does not preclude SARS-Cov-2 ?infection and should not be used as the sole basis for treatment or ?other patient management decisions. A negative result may occur with  ?improper specimen collection/handling, submission of specimen other ?than nasopharyngeal swab, presence of viral mutation(s) within the ?areas targeted by this assay, and inadequate number of viral ?copies(<138 copies/mL). A negative result must be combined with ?clinical observations, patient history, and epidemiological ?information. The expected result is Negative. ? ?Fact Sheet for Patients:  ?EntrepreneurPulse.com.au ? ?Fact Sheet for Healthcare Providers:  ?IncredibleEmployment.be ? ?This test is no t yet approved or cleared by the [place] FDA and  ?has been authorized for detection and/or diagnosis of SARS-CoV-2 by ?FDA under an Emergency Use Authorization (EUA). This EUA will remain  ?in effect (meaning this test can be used) for the duration of the ?COVID-19 declaration under Section 564(b)(1) of the Act, 21 ?U.S.C.section 360bbb-3(b)(1), unless the authorization is terminated  ?or revoked sooner.  ? ? ?  ? Influenza A by PCR NEGATIVE NEGATIVE Final  ? Influenza B by PCR NEGATIVE NEGATIVE Final  ?  Comment: (NOTE) ?The Xpert Xpress SARS-CoV-2/FLU/RSV plus assay is intended as an aid ?in the diagnosis of influenza from Nasopharyngeal swab specimens and ?should not be used as a sole basis for treatment. Nasal washings and ?aspirates are unacceptable for Xpert Xpress SARS-CoV-2/FLU/RSV ?testing. ? ?Fact Sheet for Patients: ?EntrepreneurPulse.com.au ? ?Fact Sheet for Healthcare Providers: ?IncredibleEmployment.be ? ?This test is not yet approved or cleared by the [place] FDA and ?has been authorized for detection and/or diagnosis of SARS-CoV-2  by ?FDA under an Emergency Use Authorization (EUA). This EUA will remain ?in effect (meaning this test can be used) for the duration of the ?COVID-19 declaration under Section 564(b)(1) of the Act, 21 U.S.C. ?section 360bbb-3(b)(1), unless the authorization is terminated or ?revoked. ? ?Performed at [facility], [address] ?[place], [place] [address] ?  ?Culture, blood (Routine X 2) w Reflex to ID Panel     Status: Abnormal (Preliminary result)  ? Collection Time: 02/14/22  3:03 PM  ? Specimen: BLOOD  ?Result Value Ref Range Status  ? Specimen Description   Final  ?  BLOOD BLOOD LEFT WRIST ?Performed at [facility], [place] [address] [place], [place] [address] ?  ? Special Requests   Final  ?  BOTTLES DRAWN AEROBIC AND ANAEROBIC Blood Culture adequate volume ?Performed at [facility], [place] [address], [place], [place] [address] ?  ? Culture  Setup Time   Final  ?  GRAM POSITIVE COCCI ?IN BOTH AEROBIC AND ANAEROBIC BOTTLES ?CRITICAL RESULT CALLED TO, READ BACK BY AND VERIFIED WITH: ?PHARMD NICK Kila ON 02/15/22 @ 11:35 BY DRT ?  ? Culture (A)  Final  ?  STAPHYLOCOCCUS AUREUS ?SUSCEPTIBILITIES TO FOLLOW ?Performed at [facility],  [address], [place], [place] [address] ?  ? Report Status PENDING  Incomplete  ?Blood Culture ID Panel (Reflexed)     Status: Abnormal  ? Collection Time: 02/14/22  3:03 PM  ?Result Value Ref Range Status  ? Enterococcus faecalis NOT DETECTED NOT DETECTED Final  ? Enterococcus Faecium NOT DETECTED NOT DETECTED Final  ? Listeria monocytogenes NOT DETECTED NOT DETECTED Final  ? Staphylococcus species DETECTED (A) NOT DETECTED Final  ?  Comment: CRITICAL RESULT CALLED TO, READ BACK BY AND VERIFIED WITH:  ? Staphylococcus aureus (BCID) DETECTED (A) NOT DETECTED Final  ?  Comment: CRITICAL RESULT CALLED TO, READ BACK BY AND VERIFIED WITH:  ? Staphylococcus epidermidis NOT DETECTED NOT DETECTED Final  ? Staphylococcus  lugdunensis NOT DETECTED NOT DETECTED Final  ? Streptococcus species NOT DETECTED NOT DETECTED Final  ? Streptococcus agalactiae NOT DETECTED NOT DETECTED Final  ? Streptococcus pneumoniae NOT DETECTED NOT DETECT

## 2022-02-16 NOTE — Progress Notes (Signed)
?   02/16/22 0855  ?Assess: MEWS Score  ?Temp (!) 101.2 ?F (38.4 ?C)  ?BP 112/66  ?ECG Heart Rate (!) 122  ?Resp (!) 30  ?Level of Consciousness Alert  ?SpO2 96 %  ?O2 Device Room Air  ?Assess: MEWS Score  ?MEWS Temp 1  ?MEWS Systolic 0  ?MEWS Pulse 2  ?MEWS RR 2  ?MEWS LOC 0  ?MEWS Score 5  ?MEWS Score Color Red  ?Assess: if the MEWS score is Yellow or Red  ?Were vital signs taken at a resting state? Yes  ?Focused Assessment No change from prior assessment  ?Does the patient meet 2 or more of the SIRS criteria? Yes  ?Does the patient have a confirmed or suspected source of infection? Yes  ?Provider and Rapid Response Notified? Yes  ?MEWS guidelines implemented *See Row Information* Yes  ?Treat  ?MEWS Interventions Administered prn meds/treatments;Escalated (See documentation below)  ?Pain Scale 0-10  ?Pain Score 0  ?Take Vital Signs  ?Increase Vital Sign Frequency  Red: Q 1hr X 4 then Q 4hr X 4, if remains red, continue Q 4hrs  ?Escalate  ?MEWS: Escalate Red: discuss with charge nurse/RN and provider, consider discussing with RRT  ?Notify: Charge Nurse/RN  ?Name of Charge Nurse/RN Notified Christian, RN  ?Date Charge Nurse/RN Notified 02/16/22  ?Time Charge Nurse/RN Notified [phone]  ?Notify: Provider  ?Provider Name/Title MD Rhona Leavens  ?Date Provider Notified 02/16/22  ?Time Provider Notified 0900  ?Notification Type Page  ?Notification Reason Other (Comment) ?(Red MEWS initiated per protocol)  ?Provider response See new orders  ?Date of Provider Response 02/16/22  ?Time of Provider Response 0901  ?Notify: Rapid Response  ?Name of Rapid Response RN Notified Christian, RN ?(at bedside during this time)  ?Date Rapid Response Notified 02/16/22  ?Time Rapid Response Notified 0850  ?Document  ?Patient Outcome Not stable and remains on department  ?Progress note created (see row info) Yes  ?Assess: SIRS CRITERIA  ?SIRS Temperature  1  ?SIRS Pulse 1  ?SIRS Respirations  1  ?SIRS WBC 0  ?SIRS Score Sum  3  ? ?Red MEWS protocol  initiated. Consulting civil engineer notified. Rapid response RN Ephriam Knuckles) at bedside during this time. MD notified. Pt given PRN medication given for fever. Pt Aox4. ?

## 2022-02-17 ENCOUNTER — Inpatient Hospital Stay (HOSPITAL_COMMUNITY): Payer: Medicare Other

## 2022-02-17 DIAGNOSIS — B9561 Methicillin susceptible Staphylococcus aureus infection as the cause of diseases classified elsewhere: Secondary | ICD-10-CM | POA: Diagnosis not present

## 2022-02-17 DIAGNOSIS — J189 Pneumonia, unspecified organism: Secondary | ICD-10-CM | POA: Diagnosis not present

## 2022-02-17 DIAGNOSIS — R7881 Bacteremia: Secondary | ICD-10-CM | POA: Diagnosis not present

## 2022-02-17 DIAGNOSIS — Z7901 Long term (current) use of anticoagulants: Secondary | ICD-10-CM | POA: Diagnosis not present

## 2022-02-17 LAB — MAGNESIUM: Magnesium: 2.2 mg/dL (ref 1.7–2.4)

## 2022-02-17 LAB — COMPREHENSIVE METABOLIC PANEL
ALT: 6 U/L (ref 0–44)
AST: 13 U/L — ABNORMAL LOW (ref 15–41)
Albumin: 2.3 g/dL — ABNORMAL LOW (ref 3.5–5.0)
Alkaline Phosphatase: 72 U/L (ref 38–126)
Anion gap: 7 (ref 5–15)
BUN: 7 mg/dL — ABNORMAL LOW (ref 8–23)
CO2: 29 mmol/L (ref 22–32)
Calcium: 8.2 mg/dL — ABNORMAL LOW (ref 8.9–10.3)
Chloride: 99 mmol/L (ref 98–111)
Creatinine, Ser: 0.57 mg/dL (ref 0.44–1.00)
GFR, Estimated: >60 mL/min (ref >60)
Glucose, Bld: 138 mg/dL — ABNORMAL HIGH (ref 70–99)
Potassium: 4.5 mmol/L (ref 3.5–5.1)
Sodium: 135 mmol/L (ref 135–145)
Total Bilirubin: 0.3 mg/dL (ref 0.3–1.2)
Total Protein: 6.5 g/dL (ref 6.5–8.1)

## 2022-02-17 LAB — GLUCOSE, CAPILLARY
Glucose-Capillary: 109 mg/dL — ABNORMAL HIGH (ref 70–99)
Glucose-Capillary: 153 mg/dL — ABNORMAL HIGH (ref 70–99)
Glucose-Capillary: 98 mg/dL (ref 70–99)
Glucose-Capillary: 143 mg/dL — ABNORMAL HIGH (ref 70–99)

## 2022-02-17 LAB — APTT: aPTT: 32 seconds (ref 24–36)

## 2022-02-17 LAB — CBC
HCT: 39.7 % (ref 36.0–46.0)
Hemoglobin: 12 g/dL (ref 12.0–15.0)
MCH: 26.8 pg (ref 26.0–34.0)
MCHC: 30.2 g/dL (ref 30.0–36.0)
MCV: 88.6 fL (ref 80.0–100.0)
Platelets: 386 10*3/uL (ref 150–400)
RBC: 4.48 MIL/uL (ref 3.87–5.11)
RDW: 15.7 % — ABNORMAL HIGH (ref 11.5–15.5)
WBC: 12.7 10*3/uL — ABNORMAL HIGH (ref 4.0–10.5)
nRBC: 0 % (ref 0.0–0.2)

## 2022-02-17 LAB — CULTURE, BLOOD (ROUTINE X 2): Special Requests: ADEQUATE

## 2022-02-17 LAB — PROTIME-INR
INR: 1.2 (ref 0.8–1.2)
Prothrombin Time: 15.2 seconds (ref 11.4–15.2)

## 2022-02-17 LAB — LACTIC ACID, PLASMA
Lactic Acid, Venous: 3.3 mmol/L (ref 0.5–1.9)
Lactic Acid, Venous: 1.5 mmol/L (ref 0.5–1.9)
Lactic Acid, Venous: 2 mmol/L (ref 0.5–1.9)

## 2022-02-17 MED ORDER — LACTATED RINGERS IV BOLUS
1500.0000 mL | Freq: Once | INTRAVENOUS | Status: AC
  Administered 2022-02-17: 1500 mL via INTRAVENOUS

## 2022-02-17 MED ORDER — IOHEXOL 350 MG/ML SOLN
100.0000 mL | Freq: Once | INTRAVENOUS | Status: AC | PRN
  Administered 2022-02-17: 75 mL via INTRAVENOUS

## 2022-02-17 MED ORDER — SODIUM CHLORIDE (PF) 0.9 % IJ SOLN
INTRAMUSCULAR | Status: AC
  Filled 2022-02-17: qty 50

## 2022-02-17 MED ORDER — LACTATED RINGERS IV BOLUS: 1000.0000 mL | Freq: Once | INTRAVENOUS | Status: DC

## 2022-02-17 MED ORDER — LORAZEPAM 2 MG/ML IJ SOLN
1.0000 mg | Freq: Once | INTRAMUSCULAR | Status: AC
Start: 2022-02-17 — End: 2022-02-17
  Administered 2022-02-17: 1 mg via INTRAVENOUS
  Filled 2022-02-17: qty 1

## 2022-02-17 NOTE — Progress Notes (Signed)
?  [facility] ? ? ?Reason for visit: Follow up on bacteremia ? ?Interval History: now 2bottles in one set positive with MSSA, second set remains negative. Repeat blood cultures sent; Tmax 100.3; WBC 12.7.  ?Day 4 antibiotics ?Day 3 cefazolin ? ? ?Physical Exam: ?Constitutional:  ?Vitals:  ? 02/17/22 1021 02/17/22 1116  ?BP: 99/60 (!) 90/58  ?Pulse: (!) 109 (!) 106  ?Resp: 18 19  ?Temp: 98.6 ?F (37 ?C) 98.6 ?F (37 ?C)  ?SpO2: 96% 94%  ? patient appears in NAD ?Respiratory: Normal respiratory effort; CTA B ?Cardiovascular: RRR ? ? ?Review of Systems: ?Gastrointestinal: negative for diarrhea ? ?Lab Results  ?Component Value Date  ? WBC 12.7 (H) 02/17/2022  ? HGB 12.0 02/17/2022  ? HCT 39.7 02/17/2022  ? MCV 88.6 02/17/2022  ? PLT 386 02/17/2022  ?  ?Lab Results  ?Component Value Date  ? CREATININE 0.57 02/17/2022  ? BUN 7 (L) 02/17/2022  ? NA 135 02/17/2022  ? K 4.5 02/17/2022  ? CL 99 02/17/2022  ? CO2 29 02/17/2022  ?  ?Lab Results  ?Component Value Date  ? ALT 6 02/17/2022  ? AST 13 (L) 02/17/2022  ? ALKPHOS 72 02/17/2022  ?  ? ?Microbiology: ?Recent Results (from the past 240 hour(s))  ?Resp Panel by RT-PCR (Flu A&B, Covid) Nasopharyngeal Swab     Status: None  ? Collection Time: 02/14/22  1:45 PM  ? Specimen: Nasopharyngeal Swab; Nasopharyngeal(NP) swabs in vial transport medium  ?Result Value Ref Range Status  ? SARS Coronavirus 2 by RT PCR NEGATIVE NEGATIVE Final  ?  Comment: (NOTE) ?SARS-CoV-2 target nucleic acids are NOT DETECTED. ? ?The SARS-CoV-2 RNA is generally detectable in upper respiratory ?specimens during the acute phase of infection. The lowest ?concentration of SARS-CoV-2 viral copies this assay can detect is ?138 copies/mL. A negative result does not preclude SARS-Cov-2 ?infection and should not be used as the sole basis for treatment or ?other patient management decisions. A negative result may occur with  ?improper specimen collection/handling, submission of specimen  other ?than nasopharyngeal swab, presence of viral mutation(s) within the ?areas targeted by this assay, and inadequate number of viral ?copies(<138 copies/mL). A negative result must be combined with ?clinical observations, patient history, and epidemiological ?information. The expected result is Negative. ? ?Fact Sheet for Patients:  ?BloggerCourse.com ? ?Fact Sheet for Healthcare Providers:  ?SeriousBroker.it ? ?This test is no t yet approved or cleared by the [place] FDA and  ?has been authorized for detection and/or diagnosis of SARS-CoV-2 by ?FDA under an Emergency Use Authorization (EUA). This EUA will remain  ?in effect (meaning this test can be used) for the duration of the ?COVID-19 declaration under Section 564(b)(1) of the Act, 21 ?U.S.C.section 360bbb-3(b)(1), unless the authorization is terminated  ?or revoked sooner.  ? ? ?  ? Influenza A by PCR NEGATIVE NEGATIVE Final  ? Influenza B by PCR NEGATIVE NEGATIVE Final  ?  Comment: (NOTE) ?The Xpert Xpress SARS-CoV-2/FLU/RSV plus assay is intended as an aid ?in the diagnosis of influenza from Nasopharyngeal swab specimens and ?should not be used as a sole basis for treatment. Nasal washings and ?aspirates are unacceptable for Xpert Xpress SARS-CoV-2/FLU/RSV ?testing. ? ?Fact Sheet for Patients: ?BloggerCourse.com ? ?Fact Sheet for Healthcare Providers: ?SeriousBroker.it ? ?This test is not yet approved or cleared by the [place] FDA and ?has been authorized for detection and/or diagnosis of SARS-CoV-2 by ?FDA under an Emergency Use Authorization (EUA). This EUA will remain ?  in effect (meaning this test can be used) for the duration of the ?COVID-19 declaration under Section 564(b)(1) of the Act, 21 U.S.C. ?section 360bbb-3(b)(1), unless the authorization is terminated or ?revoked. ? ?Performed at [facility], [address] ?[place], [place] [address] ?  ?Culture, blood (Routine X 2) w Reflex to ID Panel     Status: Abnormal  ? Collection Time: 02/14/22  3:03 PM  ? Specimen: BLOOD  ?Result Value Ref Range Status  ? Specimen Description   Final  ?  BLOOD BLOOD LEFT WRIST ?Performed at [facility], [address], [place], [place] [address] ?  ? Special Requests   Final  ?  BOTTLES DRAWN AEROBIC AND ANAEROBIC Blood Culture adequate volume ?Performed at [facility], [address] [place], [place] [address] ?  ? Culture  Setup Time   Final  ?  GRAM POSITIVE COCCI ?IN BOTH AEROBIC AND ANAEROBIC BOTTLES ?CRITICAL RESULT CALLED TO, READ BACK BY AND VERIFIED WITH: ?PHARMD NICK GLOGOVAC ON 02/15/22 @ 11:35 BY DRT ?Performed at [facility], [address], [place], [place] [address] ?  ? Culture STAPHYLOCOCCUS AUREUS (A)  Final  ? Report Status 02/17/2022 FINAL  Final  ? Organism ID, Bacteria STAPHYLOCOCCUS AUREUS  Final  ?    Susceptibility  ? Staphylococcus aureus - MIC*  ?  CIPROFLOXACIN <=0.5 SENSITIVE Sensitive   ?  ERYTHROMYCIN <=0.25 SENSITIVE Sensitive   ?  GENTAMICIN <=0.5 SENSITIVE Sensitive   ?  OXACILLIN 0.5 SENSITIVE Sensitive   ?  TETRACYCLINE <=1 SENSITIVE Sensitive   ?  VANCOMYCIN <=0.5 SENSITIVE Sensitive   ?  TRIMETH/SULFA <=10 SENSITIVE Sensitive   ?  CLINDAMYCIN <=0.25 SENSITIVE Sensitive   ?  RIFAMPIN <=0.5 SENSITIVE Sensitive   ?  Inducible Clindamycin NEGATIVE Sensitive   ?  * STAPHYLOCOCCUS AUREUS  ?Blood Culture ID Panel (Reflexed)     Status: Abnormal  ? Collection Time: 02/14/22  3:03 PM  ?Result Value Ref Range Status  ? Enterococcus faecalis NOT DETECTED NOT DETECTED Final  ? Enterococcus Faecium NOT DETECTED NOT DETECTED Final  ? Listeria monocytogenes NOT DETECTED NOT DETECTED Final  ? Staphylococcus species DETECTED (A) NOT DETECTED Final  ?  Comment: CRITICAL RESULT CALLED TO, READ BACK BY AND VERIFIED WITH:  ? Staphylococcus aureus (BCID) DETECTED (A) NOT DETECTED  Final  ?  Comment: CRITICAL RESULT CALLED TO, READ BACK BY AND VERIFIED WITH:  ? Staphylococcus epidermidis NOT DETECTED NOT DETECTED Final  ? Staphylococcus lugdunensis NOT DETECTED NOT DETECTED Final  ? Streptococcus species NOT DETECTED NOT DETECTED Final  ? Streptococcus agalactiae NOT DETECTED NOT DETECTED Final  ? Streptococcus pneumoniae NOT DETECTED NOT DETECTED Final  ? Streptococcus pyogenes NOT DETECTED NOT DETECTED Final  ? A.calcoaceticus-baumannii NOT DETECTED NOT DETECTED Final  ? Bacteroides fragilis NOT DETECTED NOT DETECTED Final  ? Enterobacterales NOT DETECTED NOT DETECTED Final  ? Enterobacter cloacae complex NOT DETECTED NOT DETECTED Final  ? Escherichia coli NOT DETECTED NOT DETECTED Final  ? Klebsiella aerogenes NOT DETECTED NOT DETECTED Final  ? Klebsiella oxytoca NOT DETECTED NOT DETECTED Final  ? Klebsiella pneumoniae NOT DETECTED NOT DETECTED Final  ? Proteus species NOT DETECTED NOT DETECTED Final  ? Salmonella species NOT DETECTED NOT DETECTED Final  ? Serratia marcescens NOT DETECTED NOT DETECTED Final  ? Haemophilus influenzae NOT DETECTED NOT DETECTED Final  ? Neisseria meningitidis NOT DETECTED NOT DETECTED Final  ? Pseudomonas aeruginosa NOT DETECTED NOT DETECTED  Final  ? Stenotrophomonas maltophilia NOT DETECTED NOT DETECTED Final  ? Candida albicans NOT DETECTED NOT DETECTED Final  ? Candida auris NOT DETECTED NOT DETECTED Final  ? Candida glabrata NOT DETECTED NOT DETECTED Final  ? Candida krusei NOT DETECTED NOT DETECTED Final  ? Candida parapsilosis NOT DETECTED NOT DETECTED Final  ? Candida tropicalis NOT DETECTED NOT DETECTED Final  ? Cryptococcus neoformans/gattii NOT DETECTED NOT DETECTED Final  ? Meth resistant mecA/C and MREJ NOT DETECTED NOT DETECTED Final  ?  Comment: Performed at [facility], [address] [place], [place] [address]  ?Culture, blood (Routine X 2) w Reflex to ID Panel     Status: None (Preliminary result)  ? Collection Time: 02/14/22  5:17 PM   ? Specimen: BLOOD  ?Result Value Ref Range Status  ? Specimen Description   Final  ?  BLOOD ?Performed at [facility], [address] [place], [place] [address] ?  ? Special Requests

## 2022-02-17 NOTE — Progress Notes (Signed)
?  Progress Note ? ? ?Patient: Kelly Lee [id number] DOB: April 24, 1951 DOA: 02/14/2022     3 ?DOS: the patient was seen and examined on 02/17/2022 ?  ?Brief hospital course: ?71 y.o. female with medical history significant of HTN, DM2. Presenting with weakness. She reports that she has felt weak for at least the last 4 days. She has had poor PO intake during that time. She has had nausea, but no vomiting. She reports productive cough, but no fever. She tried APAP, advil and allergy meds to help; however, they didn't work. In addition, she has notice some draining from her pannus wound. When she didn't feel better today, she called EMS to take her to the ED for evaluation. ? ?Assessment and Plan: ?No notes have been filed under this hospital service. ?Service: Hospitalist ? ?RUL PNA with MSSA bacteremia with sepsis present on admission ?-Initially continued on levaquin ?-CXR findings of mid-R upper lobe airspace opacity ?-No leukocytosis, afebrile ?-Remains tachycardic with HR in the mid-100's with tachypnea with RR in the mid-20's ?-Blood cx noted to be pos for 1/4 MSSA. Incidental cor pulmonale noted on echo. Will order f/u CTA chest ?-2d echo without evidence of vegetation, pending TEE as follow up ?-Repeat blood cx neg thus far ?-repeat cbc in aM ?  ?Chronic pannus wound ?    - WOCN consulted ?    - suspect source of MSSA bacteremia ?  ?Hypokalemia ?Hypocalcemia ?    - Potassium and Ca replaced ? ?Hypomagnesemia ?-replaced ?  ?DM2, uncontrolled ?    - A1c, SSI, glucose checks, DM diet ?-Glycemic trend stable ?  ?Hx of DVT ?    - continue eliquis ?  ?HTN ?    - Hypotensive overnight, metoprolol stopped ?-BP improved with NS bolus ?  ?PAF ?    - cont metoprolol per home regimen ?- HR in the mid-100's ?-cont eliquis, no evidence of acute blood loss ?-recent EKG performed and reviewed. Findings of sinus tach ?  ?CAD ?HLD ?    - statin per home regimen ?-Denies chest pains ?-Now off metoprolol secondary to  hypotension ?  ?Hyperthyroidism ?    - cont methimazole per home regimen ?-TSH 0.182 ?  ? ?  ? ?Subjective: Complained of feeling weak this AM.  ? ?Physical Exam: ?Vitals:  ? 02/17/22 1021 02/17/22 1116 02/17/22 1236 02/17/22 1631  ?BP: 99/60 (!) 90/58 (!) 91/52 103/63  ?Pulse: (!) 109 (!) 106 (!) 104 (!) 111  ?Resp: 18 19 18  (!) 21  ?Temp: 98.6 ?F (37 ?C) 98.6 ?F (37 ?C) 98.9 ?F (37.2 ?C) 98.4 ?F (36.9 ?C)  ?TempSrc: Oral Oral Oral Oral  ?SpO2: 96% 94% 94% 99%  ?Weight:      ?Height:      ? ?General exam: Awake, laying in bed, in nad ?Respiratory system: Normal respiratory effort, no wheezing ?Cardiovascular system: regular rate, s1, s2 ?Gastrointestinal system: Soft, nondistended, positive BS ?Central nervous system: CN2-12 grossly intact, strength intact ?Extremities: Perfused, no clubbing ?Skin: Normal skin turgor, no notable skin lesions seen ?Psychiatry: Mood normal // no visual hallucinations  ? ?Data Reviewed: ? ?Labs reviewed: K 4.5, Mg 2.2, WBC 12.7 ? ?Family Communication: Pt in room, family not at bedside ? ?Disposition: ?Status is: Inpatient ?Remains inpatient appropriate because: Severity of illness ? Planned Discharge Destination: Home ? ? ? ? ?Author: ?Marylu Lund, MD ?02/17/2022 5:50 PM ? ?For on call review [email or web address].  ?

## 2022-02-17 NOTE — Progress Notes (Addendum)
BRIEF CROSS COVERING REPORT FOR [facility] ? ? ?Notified by patient's RN via page @11 .34 pm,  @11 :36 pm and via secure chat at 11:45 pm that patient had a 10 beat non sustained Vtach asymptomatic with BP 85/88 ? ?On review of chart, most recent vitals  showed temperature was 36.7 ?C, the heart rate 104 beats/minute, the blood pressure 91/53 mm Hg, the respiratory rate 19 breaths/minute, and the oxygen saturation 96 % on RA. ? ?Pertinent Lab Data ?Na+/ K+: 135/3.6 previously K+ 2.6 ?Glucose: 140 ?BUN/Cr.:5/0.54 ?Calcium: 8.4 ?Magnesium: 1.6 ? ? ?70. Y.o female admitted with sepsis 2/2 MSSA Bactremia and RUL Opacity/?PNA. ? ?Episode of Vtach Asymptomatic ?Hypokalemia~RESOLVED ?Hypomagnesemia~ corrected with 4 g mag on 4/6 @1000  ?-Supplemental O2 as needed to maintain O2 saturations 88 to 92% ?-Continuous cardiac monitoring ?-Follow BMP ?-Replace electrolytes as indicated ?-TTE 4/5 with no concerns for vegetations or endocarditis  ?  ? ?  ? ?6/6, MSN, DNP, CCRN, FNP-C, AGACNP-BC ?Acute Care Nurse Practitioner  ?[facility] ?Pager: [phone] ?[place] [facility] ? ?  ? ?

## 2022-02-17 NOTE — Significant Event (Signed)
Rapid Response Event Note  ? ?Reason for Call :  ? ?Hypotension ? ?Initial Focused Assessment:  ? ?Patient alert and oriented. Resting in bed, moving all extremities, joking with staff. AC was called due to hypotension and LR bonus was started and lactic acid was ordered. MD paged and discussed plan of care. Patient had similar event yesterday. Concern hat BID XR Metoprolol may be the cause. Per patient she takes it once a day at home. Patient mentating and has no complaints of dizziness. MD to bedside for evaluation. ? ?Interventions:  ? ?1500 ml fluid resuscitation to meet 30 ml/kg  ?Tylenol for low grade fever ?Lactic Acid ? ?Plan of Care:  ? ?HR, SpO2, and BP improved with fluids, BID XR Metoprolol discontinued, Robitussin given for cough. Plan to remain on telemetry and push fluids and nutrition. ? ?Event Summary:  ? ?MD Notified:  0830 ?Call Time: 0830 ?Arrival Time: [phone] ?End Time: 0915 ? ?Skip Estimable, RN ?

## 2022-02-17 NOTE — Progress Notes (Signed)
Date and time results received: 02/17/22 at 0915 ? ?Test: Lactic Acid  ?Critical Value: 3.3 ? ?Name of Provider Notified via page: MD Rhona Leavens ? ?

## 2022-02-17 NOTE — Care Management Important Message (Signed)
Important Message ? ?Patient Details IM Letter placed in Patients room. ?Name: Kelly Lee ?MRN: [id number] ?Date of Birth: 06-29-1951 ? ? ?Medicare Important Message Given:  Yes ? ? ? ? ?Caren Macadam ?02/17/2022, 11:57 AM ?

## 2022-02-17 NOTE — Progress Notes (Signed)
?   02/17/22 0816  ?Assess: MEWS Score  ?Temp 100.3 ?F (37.9 ?C)  ?BP (!) 65/56  ?Pulse Rate (!) 120  ?Resp (!) 27  ?Level of Consciousness Alert  ?SpO2 97 %  ?O2 Device Room Air  ?Assess: MEWS Score  ?MEWS Temp 0  ?MEWS Systolic 3  ?MEWS Pulse 2  ?MEWS RR 2  ?MEWS LOC 0  ?MEWS Score 7  ?MEWS Score Color Red  ?Assess: if the MEWS score is Yellow or Red  ?Were vital signs taken at a resting state? Yes  ?Focused Assessment Change from prior assessment (see assessment flowsheet)  ?Does the patient meet 2 or more of the SIRS criteria? Yes  ?Does the patient have a confirmed or suspected source of infection? Yes  ?Provider and Rapid Response Notified? Yes  ?MEWS guidelines implemented *See Row Information* Yes  ?Treat  ?MEWS Interventions Administered prn meds/treatments;Escalated (See documentation below)  ?Pain Scale 0-10  ?Pain Score 0  ?Take Vital Signs  ?Increase Vital Sign Frequency  Red: Q 1hr X 4 then Q 4hr X 4, if remains red, continue Q 4hrs  ?Escalate  ?MEWS: Escalate Red: discuss with charge nurse/RN and provider, consider discussing with RRT  ?Notify: Charge Nurse/RN  ?Name of Charge Nurse/RN Notified Malachi Carl, RN  ?Date Charge Nurse/RN Notified 02/17/22  ?Time Charge Nurse/RN Notified [phone]  ?Notify: Provider  ?Provider Name/Title MD Rhona Leavens  ?Date Provider Notified 02/17/22  ?Time Provider Notified [phone]  ?Notification Type Page  ?Notification Reason Other (Comment);Change in status ?(Red MEWS initiated per protocol)  ?Provider response See new orders  ?Date of Provider Response 02/17/22  ?Time of Provider Response [phone]  ?Notify: Rapid Response  ?Name of Rapid Response RN Notified  ?(AC Bailey at bedside. Rapid response called by [facility].)  ?Date Rapid Response Notified 02/17/22  ?Time Rapid Response Notified 0841  ?Document  ?Patient Outcome Transferred/level of care increased  ?Progress note created (see row info) Yes  ?Assess: SIRS CRITERIA  ?SIRS Temperature  0  ?SIRS Pulse 1  ?SIRS Respirations  1  ?SIRS WBC 1   ?SIRS Score Sum  3  ? ?Red MEWS protocol initiated per protocol. MD made aware. AC Bailey at bedside during this time. Rapid response called by [facility]. Pt given PRN Tylenol for elevated temp. Pt Aox4; following commands.  ?

## 2022-02-17 NOTE — Progress Notes (Signed)
? ? ?  Kelly Lee has been requested to perform a transesophageal echocardiogram on Kelly Lee for bacteremia. Per chart review, patient has a past medical history of HTN, DM2. Patient presented to the ED on 4/4 complaining of weakness and draining from her pannus wound. Blood cultures grew MSSA, and the source of infection is believed to be the open wounds in her right groin, right lower leg, and sacral ulcer. TTE on 4/5 was without concerns for vegetations or endocarditis, LVEF 55-60%. ID recommended TEE (may cancel if repeat blood cultures are negative and patient has no further fevers.)  ? ?After careful review of history and examination, the risks and benefits of transesophageal echocardiogram have been explained including risks of esophageal damage, perforation (1:10,000 risk), bleeding, pharyngeal hematoma as well as other potential complications associated with conscious sedation including aspiration, arrhythmia, respiratory failure and death. Alternatives to treatment were discussed, questions were answered. Patient is willing to proceed.  ? ?Kelly Billet, PA-C ?02/17/2022 12:00 PM  ? ?

## 2022-02-17 NOTE — Progress Notes (Signed)
At 2313 10 beat run of Vtach b/p 86/55 p 105 Asymptomatic, converted back to SR  On call notified No new orders noted ?

## 2022-02-18 ENCOUNTER — Inpatient Hospital Stay (HOSPITAL_COMMUNITY): Payer: Medicare Other

## 2022-02-18 DIAGNOSIS — J189 Pneumonia, unspecified organism: Secondary | ICD-10-CM | POA: Diagnosis not present

## 2022-02-18 DIAGNOSIS — Z7901 Long term (current) use of anticoagulants: Secondary | ICD-10-CM | POA: Diagnosis not present

## 2022-02-18 DIAGNOSIS — R579 Shock, unspecified: Secondary | ICD-10-CM | POA: Diagnosis not present

## 2022-02-18 LAB — GLUCOSE, CAPILLARY
Glucose-Capillary: 90 mg/dL (ref 70–99)
Glucose-Capillary: 132 mg/dL — ABNORMAL HIGH (ref 70–99)
Glucose-Capillary: 102 mg/dL — ABNORMAL HIGH (ref 70–99)
Glucose-Capillary: 20 mg/dL — CL (ref 70–99)
Glucose-Capillary: 46 mg/dL — ABNORMAL LOW (ref 70–99)
Glucose-Capillary: 49 mg/dL — ABNORMAL LOW (ref 70–99)
Glucose-Capillary: 53 mg/dL — ABNORMAL LOW (ref 70–99)
Glucose-Capillary: 54 mg/dL — ABNORMAL LOW (ref 70–99)
Glucose-Capillary: 75 mg/dL (ref 70–99)
Glucose-Capillary: 127 mg/dL — ABNORMAL HIGH (ref 70–99)
Glucose-Capillary: 163 mg/dL — ABNORMAL HIGH (ref 70–99)
Glucose-Capillary: 233 mg/dL — ABNORMAL HIGH (ref 70–99)

## 2022-02-18 LAB — COMPREHENSIVE METABOLIC PANEL
ALT: 5 U/L (ref 0–44)
AST: 11 U/L — ABNORMAL LOW (ref 15–41)
Albumin: 2.1 g/dL — ABNORMAL LOW (ref 3.5–5.0)
Alkaline Phosphatase: 71 U/L (ref 38–126)
Anion gap: 4 — ABNORMAL LOW (ref 5–15)
BUN: 5 mg/dL — ABNORMAL LOW (ref 8–23)
CO2: 28 mmol/L (ref 22–32)
Calcium: 8.1 mg/dL — ABNORMAL LOW (ref 8.9–10.3)
Chloride: 105 mmol/L (ref 98–111)
Creatinine, Ser: 0.52 mg/dL (ref 0.44–1.00)
GFR, Estimated: >60 mL/min (ref >60)
Glucose, Bld: 47 mg/dL — ABNORMAL LOW (ref 70–99)
Potassium: 4 mmol/L (ref 3.5–5.1)
Sodium: 137 mmol/L (ref 135–145)
Total Bilirubin: 0.1 mg/dL — ABNORMAL LOW (ref 0.3–1.2)
Total Protein: 6.2 g/dL — ABNORMAL LOW (ref 6.5–8.1)

## 2022-02-18 LAB — LACTIC ACID, PLASMA: Lactic Acid, Venous: 3 mmol/L (ref 0.5–1.9)

## 2022-02-18 LAB — CBC
HCT: 35.9 % — ABNORMAL LOW (ref 36.0–46.0)
Hemoglobin: 10.9 g/dL — ABNORMAL LOW (ref 12.0–15.0)
MCH: 26.5 pg (ref 26.0–34.0)
MCHC: 30.4 g/dL (ref 30.0–36.0)
MCV: 87.3 fL (ref 80.0–100.0)
Platelets: 489 10*3/uL — ABNORMAL HIGH (ref 150–400)
RBC: 4.11 MIL/uL (ref 3.87–5.11)
RDW: 15.9 % — ABNORMAL HIGH (ref 11.5–15.5)
WBC: 15.9 10*3/uL — ABNORMAL HIGH (ref 4.0–10.5)
nRBC: 0 % (ref 0.0–0.2)

## 2022-02-18 MED ORDER — LACTATED RINGERS IV BOLUS
1000.0000 mL | Freq: Once | INTRAVENOUS | Status: AC
  Administered 2022-02-18: 1000 mL via INTRAVENOUS

## 2022-02-18 MED ORDER — DIPHENHYDRAMINE HCL 50 MG/ML IJ SOLN
25.0000 mg | Freq: Four times a day (QID) | INTRAMUSCULAR | Status: DC | PRN
Start: 2022-02-18 — End: 2022-03-02
  Administered 2022-02-19 – 2022-02-22 (×6): 25 mg via INTRAVENOUS
  Filled 2022-02-18 (×7): qty 1

## 2022-02-18 MED ORDER — LACTATED RINGERS IV BOLUS
500.0000 mL | Freq: Once | INTRAVENOUS | Status: AC
  Administered 2022-02-18: 500 mL via INTRAVENOUS

## 2022-02-18 MED ORDER — DEXTROSE 50 % IV SOLN
INTRAVENOUS | Status: AC
Start: 2022-02-18 — End: 2022-02-18
  Administered 2022-02-18: 50 mL via INTRAVENOUS
  Filled 2022-02-18: qty 50

## 2022-02-18 MED ORDER — DEXTROSE 50 % IV SOLN: 1.0000 | Freq: Once | INTRAVENOUS | Status: AC

## 2022-02-18 MED ORDER — DEXTROSE-NACL 5-0.9 % IV SOLN: INTRAVENOUS | Status: DC

## 2022-02-18 MED ORDER — MIDODRINE HCL 5 MG PO TABS
10.0000 mg | ORAL_TABLET | Freq: Three times a day (TID) | ORAL | Status: DC
Start: 2022-02-18 — End: 2022-02-22
  Administered 2022-02-18 – 2022-02-22 (×11): 10 mg via ORAL
  Filled 2022-02-18 (×14): qty 2

## 2022-02-18 MED ORDER — LACTATED RINGERS IV BOLUS: 1000.0000 mL | Freq: Once | INTRAVENOUS | Status: DC

## 2022-02-18 MED ORDER — DEXTROSE-NACL 10-0.45 % IV SOLN
INTRAVENOUS | Status: DC
  Filled 2022-02-18 (×2): qty 1000

## 2022-02-18 MED ORDER — GLUCOSE 40 % PO GEL
ORAL | Status: AC
  Filled 2022-02-18: qty 1.21

## 2022-02-18 MED ORDER — GLUCOSE 40 % PO GEL
1.0000 | Freq: Once | ORAL | Status: AC
Start: 2022-02-18 — End: 2022-02-18
  Administered 2022-02-18: 31 g via ORAL

## 2022-02-18 MED ORDER — GLUCOSE 40 % PO GEL
1.0000 | Freq: Once | ORAL | Status: AC
  Administered 2022-02-18: 31 g via ORAL
  Filled 2022-02-18: qty 1.21

## 2022-02-18 NOTE — Progress Notes (Signed)
?  [facility] ? ? ?Reason for visit: Follow up on MSSA bacteremia  ? ?Interval History: repeat blood cultures remain negative, Tmaxx 99.7 over last 24 hours, WBC up to 15.9.  Rough night with acute hypotension, hypoglycemia. CT scan with multifocal findings.  ? ? ?Physical Exam: ?Constitutional:  ?Vitals:  ? 02/18/22 0430 02/18/22 0830  ?BP: (!) 108/55 125/62  ?Pulse: (!) 117 (!) 102  ?Resp: 18 16  ?Temp: 98.2 ?F (36.8 ?C) (!) 97.5 ?F (36.4 ?C)  ?SpO2: 90% 98%  ? patient appears in NAD; fatigued appearing ?Respiratory: Normal respiratory effort; CTA B ?Cardiovascular: RRR ? ? ?Review of Systems: ?Gastrointestinal: negative for nausea and diarrhea ? ?Lab Results  ?Component Value Date  ? WBC 15.9 (H) 02/18/2022  ? HGB 10.9 (L) 02/18/2022  ? HCT 35.9 (L) 02/18/2022  ? MCV 87.3 02/18/2022  ? PLT 489 (H) 02/18/2022  ?  ?Lab Results  ?Component Value Date  ? CREATININE 0.52 02/18/2022  ? BUN 5 (L) 02/18/2022  ? NA 137 02/18/2022  ? K 4.0 02/18/2022  ? CL 105 02/18/2022  ? CO2 28 02/18/2022  ?  ?Lab Results  ?Component Value Date  ? ALT 5 02/18/2022  ? AST 11 (L) 02/18/2022  ? ALKPHOS 71 02/18/2022  ?  ? ?Microbiology: ?Recent Results (from the past 240 hour(s))  ?Resp Panel by RT-PCR (Flu A&B, Covid) Nasopharyngeal Swab     Status: None  ? Collection Time: 02/14/22  1:45 PM  ? Specimen: Nasopharyngeal Swab; Nasopharyngeal(NP) swabs in vial transport medium  ?Result Value Ref Range Status  ? SARS Coronavirus 2 by RT PCR NEGATIVE NEGATIVE Final  ?  Comment: (NOTE) ?SARS-CoV-2 target nucleic acids are NOT DETECTED. ? ?The SARS-CoV-2 RNA is generally detectable in upper respiratory ?specimens during the acute phase of infection. The lowest ?concentration of SARS-CoV-2 viral copies this assay can detect is ?138 copies/mL. A negative result does not preclude SARS-Cov-2 ?infection and should not be used as the sole basis for treatment or ?other patient management decisions. A negative result may  occur with  ?improper specimen collection/handling, submission of specimen other ?than nasopharyngeal swab, presence of viral mutation(s) within the ?areas targeted by this assay, and inadequate number of viral ?copies(<138 copies/mL). A negative result must be combined with ?clinical observations, patient history, and epidemiological ?information. The expected result is Negative. ? ?Fact Sheet for Patients:  ?BloggerCourse.com ? ?Fact Sheet for Healthcare Providers:  ?SeriousBroker.it ? ?This test is no t yet approved or cleared by the [place] FDA and  ?has been authorized for detection and/or diagnosis of SARS-CoV-2 by ?FDA under an Emergency Use Authorization (EUA). This EUA will remain  ?in effect (meaning this test can be used) for the duration of the ?COVID-19 declaration under Section 564(b)(1) of the Act, 21 ?U.S.C.section 360bbb-3(b)(1), unless the authorization is terminated  ?or revoked sooner.  ? ? ?  ? Influenza A by PCR NEGATIVE NEGATIVE Final  ? Influenza B by PCR NEGATIVE NEGATIVE Final  ?  Comment: (NOTE) ?The Xpert Xpress SARS-CoV-2/FLU/RSV plus assay is intended as an aid ?in the diagnosis of influenza from Nasopharyngeal swab specimens and ?should not be used as a sole basis for treatment. Nasal washings and ?aspirates are unacceptable for Xpert Xpress SARS-CoV-2/FLU/RSV ?testing. ? ?Fact Sheet for Patients: ?BloggerCourse.com ? ?Fact Sheet for Healthcare Providers: ?SeriousBroker.it ? ?This test is not yet approved or cleared by the [place] FDA and ?has been authorized for detection and/or diagnosis of SARS-CoV-2 by ?  FDA under an Emergency Use Authorization (EUA). This EUA will remain ?in effect (meaning this test can be used) for the duration of the ?COVID-19 declaration under Section 564(b)(1) of the Act, 21 U.S.C. ?section 360bbb-3(b)(1), unless the authorization is terminated  or ?revoked. ? ?Performed at [facility], [address] ?[place], [place] [address] ?  ?Culture, blood (Routine X 2) w Reflex to ID Panel     Status: Abnormal  ? Collection Time: 02/14/22  3:03 PM  ? Specimen: BLOOD  ?Result Value Ref Range Status  ? Specimen Description   Final  ?  BLOOD BLOOD LEFT WRIST ?Performed at [facility], [address] [place], [place] [address] ?  ? Special Requests   Final  ?  BOTTLES DRAWN AEROBIC AND ANAEROBIC Blood Culture adequate volume ?Performed at [facility], [address] [place], [place] [address] ?  ? Culture  Setup Time   Final  ?  GRAM POSITIVE COCCI ?IN BOTH AEROBIC AND ANAEROBIC BOTTLES ?CRITICAL RESULT CALLED TO, READ BACK BY AND VERIFIED WITH: ?PHARMD NICK GLOGOVAC ON 02/15/22 @ 11:35 BY DRT ?Performed at [facility], [address] [place], [place] [address] ?  ? Culture STAPHYLOCOCCUS AUREUS (A)  Final  ? Report Status 02/17/2022 FINAL  Final  ? Organism ID, Bacteria STAPHYLOCOCCUS AUREUS  Final  ?    Susceptibility  ? Staphylococcus aureus - MIC*  ?  CIPROFLOXACIN <=0.5 SENSITIVE Sensitive   ?  ERYTHROMYCIN <=0.25 SENSITIVE Sensitive   ?  GENTAMICIN <=0.5 SENSITIVE Sensitive   ?  OXACILLIN 0.5 SENSITIVE Sensitive   ?  TETRACYCLINE <=1 SENSITIVE Sensitive   ?  VANCOMYCIN <=0.5 SENSITIVE Sensitive   ?  TRIMETH/SULFA <=10 SENSITIVE Sensitive   ?  CLINDAMYCIN <=0.25 SENSITIVE Sensitive   ?  RIFAMPIN <=0.5 SENSITIVE Sensitive   ?  Inducible Clindamycin NEGATIVE Sensitive   ?  * STAPHYLOCOCCUS AUREUS  ?Blood Culture ID Panel (Reflexed)     Status: Abnormal  ? Collection Time: 02/14/22  3:03 PM  ?Result Value Ref Range Status  ? Enterococcus faecalis NOT DETECTED NOT DETECTED Final  ? Enterococcus Faecium NOT DETECTED NOT DETECTED Final  ? Listeria monocytogenes NOT DETECTED NOT DETECTED Final  ? Staphylococcus species DETECTED (A) NOT DETECTED Final  ?  Comment: CRITICAL RESULT CALLED TO, READ BACK BY  AND VERIFIED WITH:  ? Staphylococcus aureus (BCID) DETECTED (A) NOT DETECTED Final  ?  Comment: CRITICAL RESULT CALLED TO, READ BACK BY AND VERIFIED WITH:  ? Staphylococcus epidermidis NOT DETECTED NOT DETECTED Final  ? Staphylococcus lugdunensis NOT DETECTED NOT DETECTED Final  ? Streptococcus species NOT DETECTED NOT DETECTED Final  ? Streptococcus agalactiae NOT DETECTED NOT DETECTED Final  ? Streptococcus pneumoniae NOT DETECTED NOT DETECTED Final  ? Streptococcus pyogenes NOT DETECTED NOT DETECTED Final  ? A.calcoaceticus-baumannii NOT DETECTED NOT DETECTED Final  ? Bacteroides fragilis NOT DETECTED NOT DETECTED Final  ? Enterobacterales NOT DETECTED NOT DETECTED Final  ? Enterobacter cloacae complex NOT DETECTED NOT DETECTED Final  ? Escherichia coli NOT DETECTED NOT DETECTED Final  ? Klebsiella aerogenes NOT DETECTED NOT DETECTED Final  ? Klebsiella oxytoca NOT DETECTED NOT DETECTED Final  ? Klebsiella pneumoniae NOT DETECTED NOT DETECTED Final  ? Proteus species NOT DETECTED NOT DETECTED Final  ? Salmonella species NOT DETECTED NOT DETECTED Final  ? Serratia marcescens NOT DETECTED NOT DETECTED Final  ? Haemophilus influenzae NOT DETECTED NOT DETECTED Final  ? Neisseria meningitidis NOT DETECTED  NOT DETECTED Final  ? Pseudomonas aeruginosa NOT DETECTED NOT DETECTED Final  ? Stenotrophomonas maltophilia NOT DETECTED NOT DETECTED Final  ? Candida albicans NOT DETECTED NOT DETECTED Final  ? Candida auris NOT DETECTED NOT DETECTED Final  ? Candida glabrata NOT DETECTED NOT DETECTED Final  ? Candida krusei NOT DETECTED NOT DETECTED Final  ? Candida parapsilosis NOT DETECTED NOT DETECTED Final  ? Candida tropicalis NOT DETECTED NOT DETECTED Final  ? Cryptococcus neoformans/gattii NOT DETECTED NOT DETECTED Final  ? Meth resistant mecA/C and MREJ NOT DETECTED NOT DETECTED Final  ?  Comment: Performed at [facility], [address], [place], [place] [address]  ?Culture, blood (Routine X 2) w Reflex to ID Panel      Status: None (Preliminary result)  ? Collection Time: 02/14/22  5:17 PM  ? Specimen: BLOOD  ?Result Value Ref Range Status  ? Specimen Description   Final  ?  BLOOD ?Performed at [facility]

## 2022-02-18 NOTE — Progress Notes (Signed)
?Progress Note ? ? ?Patient: Kelly Lee [id number] DOB: [birth year] DOA: 02/14/2022     4 ?DOS: the patient was seen and examined on 02/18/2022 ?  ?Brief hospital course: ?71 y.o. female with medical history significant of HTN, DM2. Presenting with weakness. She reports that she has felt weak for at least the last 4 days. She has had poor PO intake during that time. She has had nausea, but no vomiting. She reports productive cough, but no fever. She tried APAP, advil and allergy meds to help; however, they didn't work. In addition, she has notice some draining from her pannus wound. When she didn't feel better today, she called EMS to take her to the ED for evaluation. ? ?Assessment and Plan: ?No notes have been filed under this hospital service. ?Service: Hospitalist ? ?RUL PNA with MSSA bacteremia with sepsis present on admission ?-Initially continued on levaquin ?-CXR findings of mid-R upper lobe airspace opacity ?-No leukocytosis, afebrile ?-Blood cx noted to be pos for 1/4 MSSA. Incidental cor pulmonale noted on echo. Chest CTA reviewed, neg for PE, but confirms multifocal PNA ?-2d echo without evidence of vegetation, pending follow up TEE ?-Repeat blood cx neg thus far ?-ID following ?  ?Chronic pannus wound ?    - WOCN consulted ?    - suspect source of MSSA bacteremia ?  ?Hypokalemia ?Hypocalcemia ?    - Potassium and Ca replaced ? ?Hypomagnesemia ?-replaced ?  ?DM2, uncontrolled ?    - A1c, SSI, glucose checks, DM diet ?-Glycemic trends had been stable since presentation, however overnight events noted with glucose dropping to the 20's ?-Long-acting insulin now stopped  ?-Cont dextrose IVF until glucose trends are more stable ?-Cont SSI coverage as needed ?  ?Hx of DVT ?    - continue eliquis ?  ?HTN ?    - Hypotensive recently improved with IVF bolus, metoprolol stopped ?  ?PAF ?    - cont metoprolol per home regimen ?-cont eliquis, no evidence of acute blood loss ?-recent EKG performed and reviewed  with sinus tach ?-likely tachycardic secondary to sepsis, HR improved with IVF ?  ?CAD ?HLD ?    - statin per home regimen ?-Denies chest pains ?-Now off metoprolol secondary to hypotension ?  ?Hyperthyroidism ?    - cont methimazole per home regimen ?-TSH 0.182 ?  ? ?  ? ?Subjective: Feeling very weak this afternoon. Overnight events noted. Pt symptomatic with hypoglycemia. Pt does not recall overnight events. ? ?Physical Exam: ?Vitals:  ? 02/18/22 1517 02/18/22 1527 02/18/22 1603 02/18/22 1721  ?BP:   (!) 133/103 (!) 104/53  ?Pulse:   (!) 125 (!) 124  ?Resp:   (!) 22 14  ?Temp:   98.3 ?F (36.8 ?C) 98.1 ?F (36.7 ?C)  ?TempSrc:   Oral   ?SpO2: 100% 99% 94% 94%  ?Weight:      ?Height:      ? ?General exam: Lethargic in AM, later in afternoon, conversant, in no acute distress ?Respiratory system: normal chest rise, clear, no audible wheezing ?Cardiovascular system: regular rhythm, s1-s2 ?Gastrointestinal system: Nondistended, nontender, pos BS ?Central nervous system: No seizures, no tremors ?Extremities: No cyanosis, no joint deformities ?Skin: No rashes, no pallor ?Psychiatry: Affect normal // no auditory hallucinations  ? ?Data Reviewed: ? ?Labs reviewed: K 4.0, WBC 15.9 ? ?Family Communication: Pt in room, family not at bedside ? ?Disposition: ?Status is: Inpatient ?Remains inpatient appropriate because: Severity of illness ? Planned Discharge Destination: Home ? ? ? ? ?Author: ?  Marylu Lund, MD ?02/18/2022 5:44 PM ? ?For on call review [email or web address].  ?

## 2022-02-18 NOTE — Progress Notes (Signed)
Hypoglycemic Event ? ?CBG: 53 ? ?Treatment: 2 tubes of dextrose gel ordered, per hypoglycemic protocol. ? ?First tube given at: 1219 ?Second tube given at: 1224 ? ?Symptoms: Pale ? ?Follow-up CBG: Time:1241 CBG Result:75 ? ?Possible Reasons for Event: Inadequate meal intake ? ?Comments/MD notified: MD Wyline Copas ? ? ? ?Hilmer Aliberti Maryan Char ? ? ?

## 2022-02-18 NOTE — Progress Notes (Addendum)
Hypoglycemic Event ? ?CBG: 46 ? ?Treatment: 8 oz juice/soda ? ?Symptoms: Pale ? ?Follow-up CBG: Time:1153 CBG Result:49 ? ?CBG was repeated a second time @ 1157; CBG was 54.  ? ?Possible Reasons for Event: Inadequate meal intake ? ?Comments/MD notified:MD Rhona Leavens ? ? ? ?Shylah Dossantos Doreen Beam ? ? ?

## 2022-02-18 NOTE — Procedures (Signed)
Central Venous Catheter Insertion Procedure Note ? ?Kelly Lee  ?[id number]  ?Sep 07, 1951 ? ?Date:02/18/22  ?Time:11:46 PM  ? ?Provider Performing:Chenille Toor A Kippy Gohman  ? ?Procedure: Insertion of Non-tunneled Central Venous Catheter(36556) with US guidance [id number])  ? ?Indication(s) ?Medication administration and Difficult access ? ?Consent ?Risks of the procedure as well as the alternatives and risks of each were explained to the patient and/or caregiver.  Consent for the procedure was obtained and is signed in the bedside chart ? ?Anesthesia ?Topical only with 1% lidocaine  ? ?Timeout ?Verified patient identification, verified procedure, site/side was marked, verified correct patient position, special equipment/implants available, medications/allergies/relevant history reviewed, required imaging and test results available. ? ?Sterile Technique ?Maximal sterile technique including full sterile barrier drape, hand hygiene, sterile gown, sterile gloves, mask, hair covering, sterile ultrasound probe cover (if used). ? ?Procedure Description ?Area of catheter insertion was cleaned with chlorhexidine and draped in sterile fashion.  With real-time ultrasound guidance a central venous catheter was placed into the right internal jugular vein. Nonpulsatile blood flow and easy flushing noted in all ports.  The catheter was sutured in place and sterile dressing applied. ? ?Complications/Tolerance ?None; patient tolerated the procedure well. ?Chest X-ray is ordered to verify placement for internal jugular or subclavian cannulation.   Chest x-ray is not ordered for femoral cannulation. ? ?EBL ?Minimal ? ?Specimen(s) ?None ? ? ? ?Rufina Falco, DNP, CCRN, FNP-C, AGACNP-BC ?Acute Care Nurse Practitioner  ?[facility] ?Pager: [phone] ?[place] [facility] ? ? ?

## 2022-02-18 NOTE — Progress Notes (Signed)
?   02/18/22 1407  ?Assess: MEWS Score  ?Temp (!) 100.7 ?F (38.2 ?C)  ?BP 111/65  ?Pulse Rate (!) 133 ?(RN notified)  ?Resp 18  ?Level of Consciousness Alert  ?SpO2 95 %  ?O2 Device Room Air  ?Assess: MEWS Score  ?MEWS Temp 1  ?MEWS Systolic 0  ?MEWS Pulse 3  ?MEWS RR 0  ?MEWS LOC 0  ?MEWS Score 4  ?MEWS Score Color Red  ?Assess: if the MEWS score is Yellow or Red  ?Were vital signs taken at a resting state? Yes  ?Focused Assessment No change from prior assessment  ?Does the patient meet 2 or more of the SIRS criteria? Yes  ?Does the patient have a confirmed or suspected source of infection? Yes  ?Provider and Rapid Response Notified? Yes  ?MEWS guidelines implemented *See Row Information* Yes  ?Treat  ?MEWS Interventions Escalated (See documentation below);Administered prn meds/treatments  ?Pain Scale 0-10  ?Pain Score 0  ?Take Vital Signs  ?Increase Vital Sign Frequency  Red: Q 1hr X 4 then Q 4hr X 4, if remains red, continue Q 4hrs  ?Escalate  ?MEWS: Escalate Red: discuss with charge nurse/RN and provider, consider discussing with RRT  ?Notify: Charge Nurse/RN  ?Name of Charge Nurse/RN Notified Abigail Butts, RN  ?Date Charge Nurse/RN Notified 02/18/22  ?Time Charge Nurse/RN Notified 1415  ?Notify: Provider  ?Provider Name/Title MD Wyline Copas  ?Date Provider Notified 02/18/22  ?Time Provider Notified 1408  ?Notification Type Face-to-face  ?Notification Reason Other (Comment) ?(Red MEWS)  ?Provider response At bedside  ?Date of Provider Response 02/18/22  ?Time of Provider Response 1408  ?Notify: Rapid Response  ?Name of Rapid Response RN Notified Zoey, RN  ?Date Rapid Response Notified 02/18/22  ?Time Rapid Response Notified 1440  ?Document  ?Patient Outcome Not stable and remains on department  ?Progress note created (see row info) Yes  ?Assess: SIRS CRITERIA  ?SIRS Temperature  0  ?SIRS Pulse 1  ?SIRS Respirations  0  ?SIRS WBC 1  ?SIRS Score Sum  2  ? ?Pt in Red MEWS. Red MEWS protocol implemented, per protocol. Pt Aox4. MD  at bedside during this time. Charge RN made aware. Rapid response RN notified. ?

## 2022-02-18 NOTE — Progress Notes (Signed)
Procedure timeout for patient performed at 2248. Patient consented for central line and arterial line. Provider Lanora Manis, NP present for procedure. All needed equipment and persons present for time out. Will continue  monitor.  ?

## 2022-02-18 NOTE — Consult Note (Addendum)
? ?NAME:  Kelly Lee, MRN:  [id number], DOB:  1951-09-20, LOS: 4 ?ADMISSION DATE:  02/14/2022, CONSULTATION DATE:  02/18/22 ?REFERRING MD:  hospitalist , CHIEF COMPLAINT:  weakness  ? ?History of Present Illness:  ?71 yo woman admitted on 02/14/22 with a hx of HTN, DM2.   ?She presented with weakness x 4 days, poor po intake and cough, draining from pannus wound.   ?MSSA bactremia found, thought 2/2 wound R groin and R lower leg vs PNA  ?Multifocal opacities on CT concerning for septic emboli.  ?Plan for TEE Monday.  ? ?Rapid response called 4/7 due to hypotension.  Bolus given.   ? ?Has been tachycardic during hospitalization ? ?Episodes of hypoglycemia today.  On D10.   ? ?This evening patient again hypotensive, continues to feel "bad".  Temp 100.  ? ?ON my exam BP fine when awake and talking but drops to 50s map sometimes [id number] systolic when sleeping.   ? ?Pertinent  Medical History  ?Hip fracture 2021 ?DM ?HTN ?Pafib Takes eliquis and plavix  ?HLD ?CAD, hx CABG  ?Hyperthyroidims ?DVT  ?Chronic wound  ?Hx of artherial thrombectomy and R calf fasciotomy on 2/23 for acute RLE ischemia  ?Hx of R wrist abscess.  ? ? ?Significant Hospital Events: ?Including procedures, antibiotic start and stop dates in addition to other pertinent events   ? ? ?Interim History / Subjective:  ? ?Objective   ?Blood pressure (!) 84/49, pulse (!) 120, temperature 100 ?F (37.8 ?C), temperature source Rectal, resp. rate 20, height 5\' 1"  (1.549 m), weight 86.5 kg, SpO2 100 %. ?   ?   ? ?Intake/Output Summary (Last 24 hours) at 02/18/2022 2338 ?Last data filed at 02/18/2022 1813 ?Gross per 24 hour  ?Intake 4586.19 ml  ?Output 600 ml  ?Net 3986.19 ml  ? ?Filed Weights  ? 02/15/22 0845  ?Weight: 86.5 kg  ? ? ?Examination: ?General: Pleasant, sleepy  ?HENT: NCAT  ?Lungs: CTAB ?Cardiovascular: RRR no MGR  ?Abdomen: nt, nd nbs ?Extremities: skin red non blanching (since this afternoon per patient) on LE and arms and chest,, no edema, wound on Lower R abd  dressed, wet to dry in place.  No surrounding erythema.  ?Neuro: a and o x 3 ? ? ?Resolved Hospital Problem list   ? ? ?Assessment & Plan:  ?Hypotension, fever: worsening sepsis.   ?Unclear why she is worsening despite being covered for her MSSA.  I will broaden her antibiotics for now, covering for other bugs like pseudomonas and mrsa as a precaution.   ?Cont to monitor closely in ICU.   ?I don't hear any murmurs or rubs suggestive of some valvular issue.   ?Levophed as needed for HTN.   ? ?Abdominal wound: cont dressing.   ? ?Erythema of skin: unclear cause.   Possibly 2/2 fever?   ? 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 ?Pafib: on eliquis.  Rate controlled.   ? ?Hypoglycemia: on D10.  Related to worsening sepsis possibly.   ? ? ? ?Best Practice (right click and "Reselect all SmartList Selections" daily)  ? ?Diet/type: Regular consistency (see orders) ?DVT prophylaxis: other ?GI prophylaxis: PPI ?Lines: N/A ?Foley:  N/A ?Code Status:  full code ?Last date of multidisciplinary goals of care discussion []  ? ?Labs   ?CBC: ?Recent Labs  ?Lab 02/14/22 ?1242 02/16/22 ?0449 02/17/22 ?0458 02/18/22 ?0600  ?WBC 9.6 6.2 12.7* 15.9*  ?NEUTROABS 6.8  --   --   --   ?HGB 10.2* 10.9* 12.0 10.9*  ?HCT  32.0* 35.0* 39.7 35.9*  ?MCV 85.1 87.9 88.6 87.3  ?PLT 315 328 386 489*  ? ? ?Basic Metabolic Panel: ?Recent Labs  ?Lab 02/14/22 ?1731 02/14/22 ?2144 02/15/22 ?[id number] 02/16/22 ?0449 02/16/22 ?0749 02/17/22 ?0458 02/18/22 ?0600  ?NA  --  133* 136 135  --  135 137  ?K  --  2.7* 3.9 3.6  --  4.5 4.0  ?CL  --  101 105 102  --  99 105  ?CO2  --  27 26 25   --  29 28  ?GLUCOSE  --  208* 248* 140*  --  138* 47*  ?BUN  --  7* 5* 5*  --  7* 5*  ?CREATININE  --  0.50 0.58 0.54  --  0.57 0.52  ?CALCIUM  --  7.9* 8.4* 8.4*  --  8.2* 8.1*  ?MG 1.6*  --  1.6*  --  1.6* 2.2  --   ?PHOS  --  1.5* 1.9*  --  2.4*  --   --   ? ?GFR: ?Estimated Creatinine Clearance: 63.5 mL/min (by C-G formula based on SCr of 0.52 mg/dL). ?Recent Labs  ?Lab 02/14/22 ?1242  02/14/22 ?1410 02/16/22 ?0449 02/17/22 ?0458 02/17/22 ?[id number] 02/17/22 ?1820 02/17/22 ?2042 02/18/22 ?0600 02/18/22 ?1903  ?WBC 9.6  --  6.2 12.7*  --   --   --  15.9*  --   ?LATICACIDVEN  --    < >  --   --  3.3* 1.5 2.0*  --  3.0*  ? < > = values in this interval not displayed.  ? ? ?Liver Function Tests: ?Recent Labs  ?Lab 02/14/22 ?1303 02/14/22 ?2144 02/16/22 ?0449 02/17/22 ?0458 02/18/22 ?0600  ?AST 13*  --  10* 13* 11*  ?ALT 8  --  6 6 5   ?ALKPHOS 72  --  73 72 71  ?BILITOT 0.8  --  0.3 0.3 0.1*  ?PROT 6.6  --  6.7 6.5 6.2*  ?ALBUMIN 2.4* 2.3* 2.4* 2.3* 2.1*  ? ?No results for input(s): LIPASE, AMYLASE in the last 168 hours. ?No results for input(s): AMMONIA in the last 168 hours. ? ?ABG ?   ?Component Value Date/Time  ? TCO2 30 12/14/2021 2004  ?  ? ?Coagulation Profile: ?Recent Labs  ?Lab 02/17/22 ?[id number]  ?INR 1.2  ? ? ?Cardiac Enzymes: ?No results for input(s): CKTOTAL, CKMB, CKMBINDEX, TROPONINI in the last 168 hours. ? ?HbA1C: ?Hgb A1c MFr Bld  ?Date/Time Value Ref Range Status  ?02/14/2022 05:17 PM 9.7 (H) 4.8 - 5.6 % Final  ?  Comment:  ?  (NOTE) ?        Prediabetes: 5.7 - 6.4 ?        Diabetes: >6.4 ?        Glycemic control for adults with diabetes: <7.0 ?  ?12/15/2021 05:55 AM 8.5 (H) 4.8 - 5.6 % Final  ?  Comment:  ?  (NOTE) ?        Prediabetes: 5.7 - 6.4 ?        Diabetes: >6.4 ?        Glycemic control for adults with diabetes: <7.0 ?  ? ? ?CBG: ?Recent Labs  ?Lab 02/18/22 ?1211 02/18/22 ?1241 02/18/22 ?1426 02/18/22 ?1616 02/18/22 ?1825  ?GLUCAP 53* 75 127* 163* 233*  ? ? ?Review of Systems:   ?Fatigue, feels tired. Not really energetic enough to answer complete ros, but denies all other symptoms.  ?Slightly itchy.   ? ?Past Medical History:  ?She,  has  a past medical history of Closed intertrochanteric fracture, left, initial encounter ([place]) (12/08/2019), Diabetes mellitus without complication ([place]), and Hypertension.  ? ?Surgical History:  ? ?Past Surgical History:  ?Procedure Laterality Date  ?  CARDIAC CATHETERIZATION    ? CHOLECYSTECTOMY    ? FASCIOTOMY Right 12/14/2021  ? Procedure: RIGHT LOWER FASCIOTOMY;  Surgeon: Cherre Robins, MD;  Location: [facility] OR;  Service: Vascular;  Laterality: Right;  ? FEMUR IM NAIL Left 12/09/2019  ? Procedure: INTRAMEDULLARY (IM) NAIL FEMORAL;  Surgeon: Paralee Cancel, MD;  Location: [facility];  Service: Orthopedics;  Laterality: Left;  ? THROMBECTOMY FEMORAL ARTERY Right 12/14/2021  ? Procedure: RIGHT LEG ARTERIAL THROMBECTOMY;  Surgeon: Cherre Robins, MD;  Location: [facility];  Service: Vascular;  Laterality: Right;  ?  ? ?Social History:  ? reports that she has never smoked. She has never used smokeless tobacco. She reports current alcohol use of about 1.0 standard drink per week. She reports that she does not use drugs.  ? ?Family History:  ?Her family history is not on file.  ? ?Allergies ?Allergies  ?Allergen Reactions  ? Penicillins Hives and Shortness Of Breath  ? Azithromycin Hives  ? Cephalexin Hives  ?  Tolerated Ancef 2022   ? Silvadene [Silver Sulfadiazine] Other (See Comments)  ?  "Burns" the skin  ? Wound Dressing Adhesive Rash  ?  ? ?Home Medications  ?Prior to Admission medications   ?Medication Sig Start Date End Date Taking? Authorizing Provider  ?ADVIL 200 MG CAPS Take 400 mg by mouth every 6 (six) hours as needed (for headaches or pain).   Yes [provider]  ?albuterol (VENTOLIN HFA) 108 (90 Base) MCG/ACT inhaler Inhale 2 puffs into the lungs every 6 (six) hours as needed for wheezing or shortness of breath. 12/06/21  Yes [provider]  ?apixaban (ELIQUIS) 5 MG TABS tablet Take 1 tablet (5 mg total) by mouth 2 (two) times daily. 12/19/21  Yes Kathie Dike, MD  ?clopidogrel (PLAVIX) 75 MG tablet Take 75 mg by mouth daily.   Yes [provider]  ?diphenhydrAMINE (BENADRYL) 25 MG tablet Take 25 mg by mouth every 6 (six) hours as needed for allergies.   Yes [provider]  ?empagliflozin (JARDIANCE) 10 MG TABS tablet Take 10 mg  by mouth daily.   Yes [provider]  ?ezetimibe (ZETIA) 10 MG tablet Take 10 mg by mouth at bedtime. 11/16/19  Yes [provider]  ?fluticasone (FLONASE) 50 MCG/ACT nasal spray Place 1 spray i

## 2022-02-18 NOTE — Progress Notes (Signed)
?   02/18/22 1209  ?Assess: MEWS Score  ?Temp 98.8 ?F (37.1 ?C)  ?BP 137/80  ?Pulse Rate (!) 124  ?Resp 18  ?Level of Consciousness Alert  ?SpO2 97 %  ?O2 Device Room Air  ?Assess: MEWS Score  ?MEWS Temp 0  ?MEWS Systolic 0  ?MEWS Pulse 2  ?MEWS RR 0  ?MEWS LOC 0  ?MEWS Score 2  ?MEWS Score Color Yellow  ?Assess: if the MEWS score is Yellow or Red  ?Were vital signs taken at a resting state? Yes  ?Focused Assessment No change from prior assessment  ?Does the patient meet 2 or more of the SIRS criteria? No  ?Does the patient have a confirmed or suspected source of infection? Yes  ?Provider and Rapid Response Notified? No ?(MD notified)  ?MEWS guidelines implemented *See Row Information* Yes  ?Treat  ?MEWS Interventions Escalated (See documentation below)  ?Pain Scale 0-10  ?Pain Score 0  ?Take Vital Signs  ?Increase Vital Sign Frequency  Yellow: Q 2hr X 2 then Q 4hr X 2, if remains yellow, continue Q 4hrs  ?Escalate  ?MEWS: Escalate Yellow: discuss with charge nurse/RN and consider discussing with provider and RRT  ?Notify: Charge Nurse/RN  ?Name of Charge Nurse/RN Notified Toniann Fail, RN  ?Date Charge Nurse/RN Notified 02/18/22  ?Time Charge Nurse/RN Notified 1210  ?Notify: Provider  ?Provider Name/Title MD Rhona Leavens  ?Date Provider Notified 02/18/22  ?Time Provider Notified 1220  ?Notification Type Page  ?Notification Reason Other (Comment) ?(elevated HR)  ?Provider response No new orders  ?Date of Provider Response 02/18/22  ?Time of Provider Response 1220  ?Document  ?Patient Outcome Not stable and remains on department  ?Progress note created (see row info) Yes  ?Assess: SIRS CRITERIA  ?SIRS Temperature  0  ?SIRS Pulse 1  ?SIRS Respirations  0  ?SIRS WBC 0  ?SIRS Score Sum  1  ? ?Pt in yellow MEWS. Yellow MEWS protocol implemented per protocol. Pt Aox4. Charge RN made aware. MD made aware.  ?

## 2022-02-18 NOTE — Progress Notes (Signed)
Cbg at 0645 20  asymptomatic. On call notified amp d50 adm  ?

## 2022-02-18 NOTE — Progress Notes (Signed)
Pharmacy Antibiotic Note ? ?Kelly Lee is a 71 y.o. female admitted on 02/14/2022 with weakness, found to have MSSA bacteremia.  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  Noted patient allergies to penicillins and cephalexin, however, patient was previously treated in Jan-Feb 2022 for MSSA bacteremia with IV cefazolin and tolerated.  Pharmacy has been consulted for cefazolin dosing. ? ?Plan: ?Continue cefazolin 2g IV q8 hours ?F/u culture data, renal function, further ID recommendations ? ?Height: 5\' 1"  (154.9 cm) ?Weight: 86.5 kg (190 lb 11.2 oz) ?IBW/kg (Calculated) : 47.8 ? ?Temp (24hrs), Avg:98.5 ?F (36.9 ?C), Min:97.5 ?F (36.4 ?C), Max:99.7 ?F (37.6 ?C) ? ?Recent Labs  ?Lab 02/14/22 ?1242 02/14/22 ?1303 02/14/22 ?1731 02/14/22 ?1959 02/14/22 ?2144 02/15/22 ?[id number] 02/16/22 ?[id number] 02/17/22 ?[id number] 02/17/22 ?[id number] 02/17/22 ?1820 02/17/22 ?2042 02/18/22 ?0600  ?WBC 9.6  --   --   --   --   --  6.2 12.7*  --   --   --  15.9*  ?CREATININE  --    < >  --   --  0.50 0.58 0.54 0.57  --   --   --  0.52  ?LATICACIDVEN  --    < > 1.9 1.5  --   --   --   --  3.3* 1.5 2.0*  --   ? < > = values in this interval not displayed.  ? ?  ?Estimated Creatinine Clearance: 63.5 mL/min (by C-G formula based on SCr of 0.52 mg/dL).   ? ?Allergies  ?Allergen Reactions  ? Penicillins Hives and Shortness Of Breath  ? Azithromycin Hives  ? Cephalexin Hives  ?  Tolerated Ancef 2022   ? Silvadene [Silver Sulfadiazine] Other (See Comments)  ?  "Burns" the skin  ? Wound Dressing Adhesive Rash  ? ? ?Antimicrobials this admission: ?Levaquin 4/4 >> 4/5 ?Cefazolin 4/5 >>  ? ?Microbiology results: ?4/4 BCx: 2 bottles in one set MSSA; BCID w/ Staph aureus no resistance ?4/5 MRSA PCR: detected ? ?Thank you for allowing pharmacy to be a part of this patient?s care. ? ?Royetta Asal, PharmD, BCPS ?Clinical Pharmacist ?[place] ?Please utilize Amion for appropriate phone number to reach the unit  pharmacist ([place]) ?02/18/2022 9:59 AM ? ? ?

## 2022-02-18 NOTE — Progress Notes (Signed)
Patient arrived to unit at 2234. Patient accompanied by rapid response nurse and primary nurse Byrd Hesselbach, RN. Will continue to monitor.  ?

## 2022-02-18 NOTE — Progress Notes (Signed)
Notified charge RN, rapid response RN, and hospitalist of pt's VS at this time. BP 90/52, Temp 100, HR 120, RR 20. Pt states she continues to feel "bad". Tylenol administered, will continue to monitor closely. ?

## 2022-02-19 ENCOUNTER — Inpatient Hospital Stay (HOSPITAL_COMMUNITY): Payer: Medicare Other

## 2022-02-19 DIAGNOSIS — R7881 Bacteremia: Secondary | ICD-10-CM

## 2022-02-19 DIAGNOSIS — R579 Shock, unspecified: Secondary | ICD-10-CM | POA: Diagnosis not present

## 2022-02-19 LAB — PROCALCITONIN
Procalcitonin: 0.16 ng/mL
Procalcitonin: 0.25 ng/mL

## 2022-02-19 LAB — GLUCOSE, CAPILLARY
Glucose-Capillary: 122 mg/dL — ABNORMAL HIGH (ref 70–99)
Glucose-Capillary: 162 mg/dL — ABNORMAL HIGH (ref 70–99)
Glucose-Capillary: 164 mg/dL — ABNORMAL HIGH (ref 70–99)
Glucose-Capillary: 172 mg/dL — ABNORMAL HIGH (ref 70–99)
Glucose-Capillary: 173 mg/dL — ABNORMAL HIGH (ref 70–99)
Glucose-Capillary: 178 mg/dL — ABNORMAL HIGH (ref 70–99)
Glucose-Capillary: 180 mg/dL — ABNORMAL HIGH (ref 70–99)
Glucose-Capillary: 256 mg/dL — ABNORMAL HIGH (ref 70–99)
Glucose-Capillary: 295 mg/dL — ABNORMAL HIGH (ref 70–99)

## 2022-02-19 LAB — CBC
HCT: 30.2 % — ABNORMAL LOW (ref 36.0–46.0)
HCT: 34.7 % — ABNORMAL LOW (ref 36.0–46.0)
Hemoglobin: 10.6 g/dL — ABNORMAL LOW (ref 12.0–15.0)
Hemoglobin: 9.3 g/dL — ABNORMAL LOW (ref 12.0–15.0)
MCH: 27 pg (ref 26.0–34.0)
MCH: 27.2 pg (ref 26.0–34.0)
MCHC: 30.5 g/dL (ref 30.0–36.0)
MCHC: 30.8 g/dL (ref 30.0–36.0)
MCV: 88.3 fL (ref 80.0–100.0)
MCV: 88.3 fL (ref 80.0–100.0)
Platelets: 359 10*3/uL (ref 150–400)
Platelets: 407 10*3/uL — ABNORMAL HIGH (ref 150–400)
RBC: 3.42 MIL/uL — ABNORMAL LOW (ref 3.87–5.11)
RBC: 3.93 MIL/uL (ref 3.87–5.11)
RDW: 15.9 % — ABNORMAL HIGH (ref 11.5–15.5)
RDW: 15.9 % — ABNORMAL HIGH (ref 11.5–15.5)
WBC: 13.9 10*3/uL — ABNORMAL HIGH (ref 4.0–10.5)
WBC: 20.9 10*3/uL — ABNORMAL HIGH (ref 4.0–10.5)
nRBC: 0 % (ref 0.0–0.2)
nRBC: 0 % (ref 0.0–0.2)

## 2022-02-19 LAB — BLOOD GAS, ARTERIAL
Acid-Base Excess: 3.9 mmol/L — ABNORMAL HIGH (ref 0.0–2.0)
Bicarbonate: 29.2 mmol/L — ABNORMAL HIGH (ref 20.0–28.0)
O2 Saturation: 93.7 %
Patient temperature: 37
pCO2 arterial: 46 mmHg (ref 32–48)
pH, Arterial: 7.41 (ref 7.35–7.45)
pO2, Arterial: 67 mmHg — ABNORMAL LOW (ref 83–108)

## 2022-02-19 LAB — COOXEMETRY PANEL
Carboxyhemoglobin: 3.4 % — ABNORMAL HIGH (ref 0.5–1.5)
Methemoglobin: <0.7 % (ref 0.0–1.5)
O2 Saturation: 80 %
Total hemoglobin: 10.5 g/dL — ABNORMAL LOW (ref 12.0–16.0)

## 2022-02-19 LAB — COMPREHENSIVE METABOLIC PANEL
ALT: <5 U/L (ref 0–44)
AST: 10 U/L — ABNORMAL LOW (ref 15–41)
Albumin: 1.9 g/dL — ABNORMAL LOW (ref 3.5–5.0)
Alkaline Phosphatase: 71 U/L (ref 38–126)
Anion gap: 4 — ABNORMAL LOW (ref 5–15)
BUN: 8 mg/dL (ref 8–23)
CO2: 27 mmol/L (ref 22–32)
Calcium: 7.6 mg/dL — ABNORMAL LOW (ref 8.9–10.3)
Chloride: 103 mmol/L (ref 98–111)
Creatinine, Ser: 0.58 mg/dL (ref 0.44–1.00)
GFR, Estimated: >60 mL/min (ref >60)
Glucose, Bld: 216 mg/dL — ABNORMAL HIGH (ref 70–99)
Potassium: 4.2 mmol/L (ref 3.5–5.1)
Sodium: 134 mmol/L — ABNORMAL LOW (ref 135–145)
Total Bilirubin: 0.3 mg/dL (ref 0.3–1.2)
Total Protein: 5.7 g/dL — ABNORMAL LOW (ref 6.5–8.1)

## 2022-02-19 LAB — TROPONIN I (HIGH SENSITIVITY): Troponin I (High Sensitivity): 12 ng/L (ref <18)

## 2022-02-19 LAB — BASIC METABOLIC PANEL
Anion gap: <3 — ABNORMAL LOW (ref 5–15)
BUN: 8 mg/dL (ref 8–23)
CO2: 29 mmol/L (ref 22–32)
Calcium: 7.5 mg/dL — ABNORMAL LOW (ref 8.9–10.3)
Chloride: 102 mmol/L (ref 98–111)
Creatinine, Ser: 0.57 mg/dL (ref 0.44–1.00)
GFR, Estimated: >60 mL/min (ref >60)
Glucose, Bld: 142 mg/dL — ABNORMAL HIGH (ref 70–99)
Potassium: 4.4 mmol/L (ref 3.5–5.1)
Sodium: 133 mmol/L — ABNORMAL LOW (ref 135–145)

## 2022-02-19 LAB — ECHOCARDIOGRAM LIMITED
Height: 61 in
Weight: 3051.17 oz

## 2022-02-19 LAB — CK TOTAL AND CKMB (NOT AT ARMC)
CK, MB: 2.5 ng/mL (ref 0.5–5.0)
Relative Index: INVALID (ref 0.0–2.5)
Total CK: 10 U/L — ABNORMAL LOW (ref 38–234)

## 2022-02-19 LAB — MAGNESIUM: Magnesium: 1.5 mg/dL — ABNORMAL LOW (ref 1.7–2.4)

## 2022-02-19 LAB — CULTURE, BLOOD (ROUTINE X 2): Culture: NO GROWTH

## 2022-02-19 LAB — DIC (DISSEMINATED INTRAVASCULAR COAGULATION)PANEL
D-Dimer, Quant: 1.95 ug/mL-FEU — ABNORMAL HIGH (ref 0.00–0.50)
Fibrinogen: 458 mg/dL (ref 210–475)
INR: 1.5 — ABNORMAL HIGH (ref 0.8–1.2)
Platelets: 394 10*3/uL (ref 150–400)
Prothrombin Time: 18.3 seconds — ABNORMAL HIGH (ref 11.4–15.2)
Smear Review: NONE SEEN
aPTT: 38 seconds — ABNORMAL HIGH (ref 24–36)

## 2022-02-19 LAB — LACTIC ACID, PLASMA
Lactic Acid, Venous: 1.4 mmol/L (ref 0.5–1.9)
Lactic Acid, Venous: 1.5 mmol/L (ref 0.5–1.9)

## 2022-02-19 LAB — MRSA NEXT GEN BY PCR, NASAL: MRSA by PCR Next Gen: DETECTED — AB

## 2022-02-19 LAB — CORTISOL: Cortisol, Plasma: 4.8 ug/dL

## 2022-02-19 MED ORDER — CHLORHEXIDINE GLUCONATE CLOTH 2 % EX PADS: 6.0000 | MEDICATED_PAD | Freq: Every day | CUTANEOUS | Status: DC

## 2022-02-19 MED ORDER — SODIUM CHLORIDE 0.9 % IV SOLN
INTRAVENOUS | Status: DC | PRN
Start: 2022-02-19 — End: 2022-02-20

## 2022-02-19 MED ORDER — VANCOMYCIN HCL 1500 MG/300ML IV SOLN
1500.0000 mg | Freq: Once | INTRAVENOUS | Status: AC
  Administered 2022-02-19: 1500 mg via INTRAVENOUS
  Filled 2022-02-19: qty 300

## 2022-02-19 MED ORDER — HYDROCORTISONE SOD SUC (PF) 100 MG IJ SOLR
50.0000 mg | Freq: Four times a day (QID) | INTRAMUSCULAR | Status: DC
  Administered 2022-02-19 – 2022-02-20 (×4): 50 mg via INTRAVENOUS
  Filled 2022-02-19 (×5): qty 2

## 2022-02-19 MED ORDER — SODIUM CHLORIDE 0.9 % IV SOLN
2.0000 g | Freq: Three times a day (TID) | INTRAVENOUS | Status: DC
  Administered 2022-02-19: 2 g via INTRAVENOUS
  Filled 2022-02-19: qty 12.5

## 2022-02-19 MED ORDER — FENTANYL CITRATE PF 50 MCG/ML IJ SOSY
12.5000 ug | PREFILLED_SYRINGE | INTRAMUSCULAR | Status: DC | PRN
  Administered 2022-02-19 (×2): 50 ug via INTRAVENOUS
  Filled 2022-02-19 (×2): qty 1

## 2022-02-19 MED ORDER — MELATONIN 3 MG PO TABS
3.0000 mg | ORAL_TABLET | Freq: Every evening | ORAL | Status: DC | PRN
  Administered 2022-02-19 – 2022-02-22 (×3): 3 mg via ORAL
  Filled 2022-02-19 (×3): qty 1

## 2022-02-19 MED ORDER — NOREPINEPHRINE 4 MG/250ML-% IV SOLN
0.0000 ug/min | INTRAVENOUS | Status: DC
  Administered 2022-02-19: 4 ug/min via INTRAVENOUS
  Administered 2022-02-19: 1 ug/min via INTRAVENOUS
  Administered 2022-02-19: 8 ug/min via INTRAVENOUS
  Filled 2022-02-19 (×3): qty 250

## 2022-02-19 MED ORDER — VANCOMYCIN HCL IN DEXTROSE 1-5 GM/200ML-% IV SOLN
1000.0000 mg | INTRAVENOUS | Status: DC
  Administered 2022-02-20 – 2022-02-23 (×4): 1000 mg via INTRAVENOUS
  Filled 2022-02-19 (×4): qty 200

## 2022-02-19 MED ORDER — MAGNESIUM SULFATE 4 GM/100ML IV SOLN
4.0000 g | Freq: Once | INTRAVENOUS | Status: AC
Start: 2022-02-19 — End: 2022-02-19
  Administered 2022-02-19: 4 g via INTRAVENOUS
  Filled 2022-02-19: qty 100

## 2022-02-19 MED ORDER — VASOPRESSIN 20 UNITS/100 ML INFUSION FOR SHOCK
0.0000 [IU]/min | INTRAVENOUS | Status: DC
  Administered 2022-02-19: 0.03 [IU]/min via INTRAVENOUS
  Filled 2022-02-19 (×2): qty 100

## 2022-02-19 MED ORDER — VANCOMYCIN HCL IN DEXTROSE 1-5 GM/200ML-% IV SOLN: 1000.0000 mg | Freq: Once | INTRAVENOUS | Status: DC

## 2022-02-19 NOTE — Progress Notes (Signed)
Midodrine administered. Lactic acid 3.0. New orders for pt to be transferred to room 1236. ?

## 2022-02-19 NOTE — Progress Notes (Addendum)
? ?NAME:  Kelly Lee, MRN:  [id number], DOB:  1951/11/01, LOS: 5 ?ADMISSION DATE:  02/14/2022, CONSULTATION DATE:  02/18/22 ?REFERRING MD:  hospitalist , CHIEF COMPLAINT:  weakness  ? ?History of Present Illness:  ?71 yo woman admitted on 02/14/22 with a hx of HTN, DM2.   She presented with weakness x 4 days, poor po intake and cough, draining from pannus wound.   MSSA bactremia found, thought 2/2 wound R groin and R lower leg vs PNA . Multifocal opacities on CT concerning for septic emboli. Plan for TEE Monday 02/20/22. Rapid response called 4/7 due to hypotension.  Bolus given.  Has been tachycardic during hospitalization.  ?Episodes of hypoglycemia 02/18/22 .  On D10.   This evening patient again hypotensive, continues to feel "bad".  Temp 100.  ON CCM exam BP fine when awake and talking but drops to 50s map sometimes [id number] systolic when sleeping.  - on 02/18/22 ? ?Pertinent  Medical History  ?Hip fracture 2021 ?DM ?HTN ?Pafib Takes eliquis and plavix  ?HLD ?CAD, hx CABG  ?Hyperthyroidims ?DVT  ?Chronic wound  ?Hx of artherial thrombectomy and R calf fasciotomy on 2/23 for acute RLE ischemia  ?Hx of R wrist abscess.  ? ? ?CULTURES and ABx  ? ?[place] PCR 4/4 - POSITIVe ?BCID 4/4 - STaph ?Blood culture 4/4 - MSSA ?Blood culture 4/6  - neg so far ?MRSA PCR 4/8 - POSITIVE ? ? ?ANTIBIOTICS ?Levaquin 4/4 - 4/5 ?Ancef 4/5 -4/9 (rash, burning) ?Cefepime 4/9 x 1 dose ?Vanc 4/9 (started afer rash, d/w Dr Linus Salmons) ? ? ? ? ? ? ?Significant Hospital Events: ?Including procedures, antibiotic start and stop dates in addition to other pertinent events   ?02/14/2022 - admit ?4/5 - ECHO - RV down. Gr 2 DDx ?4/7 CTA - no PE . Has Pulm infiltrates ?02/18/22- ccm consult and ICU transfer.  ? ?Interim History / Subjective:  ? ?02/19/22 - on 2L [place]. -> pulse ox 100%. Has cough. Alert and oriented  Fever + 101F Tmax. WBC 13.0. On 37mcg levophed gtt. Wants something for insomnia. Mostly complains of bilateral UE edema x 1 day associated with worsening  redness of skin over eyelids, chest and LE x 1 day and burning of the same overnight/ No yrticaria. No blistering ? - Cortisol overnight is 4.8 ? ?Objective   ?Blood pressure (!) 132/99, pulse (!) 112, temperature 97.8 ?F (36.6 ?C), temperature source Oral, resp. rate (!) 21, height 5\' 1"  (1.549 m), weight 86.5 kg, SpO2 100 %. ?   ?   ? ?Intake/Output Summary (Last 24 hours) at 02/19/2022 0824 ?Last data filed at 02/19/2022 [id number] ?Gross per 24 hour  ?Intake 2774.06 ml  ?Output 1630 ml  ?Net 1144.06 ml  ? ?Filed Weights  ? 02/15/22 0845  ?Weight: 86.5 kg  ? ? ?Examination: ?General Appearance:  feeling miserable and coughs. But looks stable ?Head:  Normocephalic, without obvious abnormality, atraumatic ?Eyes:  PERRL - yes, conjunctiva/corneas - muddy     ?Ears:  Normal external ear canals, both ears ?Nose:  G tube - no bu has [place] oe ?Throat:  ETT TUBE - no , OG tube - no ?Neck:  Supple,  No enlargement/tenderness/nodules ?Lungs: Clear to auscultation bilaterally,  ?Heart:  S1 and S2 normal, no murmur, CVP - no.  Pressors - LEVOPHED 80mcg ?Abdomen:  Soft, no masses, no organomegaly ?Genitalia / Rectal:  Not done ?Extremities:  Extremities- DIFFUSE LE REDNESS, Bilatera UL redness proximall and som deam.  ?Skin:  ntact in  exposed areas . Sacral area - not examined. See above. Had redness around eye lid ?Neurologic:  Sedation - none -> RASS - +1 . Moves all 4s - yes. CAM-ICU - neg . Orientation - x3+ ? ? ? ? ?Resolved Hospital Problem list   ? ? ?Assessment & Plan:  ?MSSA Bacteremia - Present on Admit - due to R groin wound ?MRSA PCR positive ? ?4/9 fevers + ? ?Plan ? - change anceef to vanc (see below) ? ?Circulatory shock   - onset 02/18/22 PM ? ? -  Unclear why she developed shock despite being covered for her MSSA.  On leveophed 70mcg. Cortisol 4.8 ? ?Plan ? - check PCT, lactate, trop, CK, DIC panel ?- chek CO-Ox STAT ?- stat echo for any valve/RV issues ?- start hydrocortisone 02/19/22 ?- cotinue midodrine since 02/18/22 ?- MAP  goal > 65 (has aline) ? - levophed to continue (via RIJ) ? - add vasopressn 02/19/22 ?  - might need giaprezza ? ?Diffuse skin ereythemia  on set 02/18/22. Preceded vanc started 02/19/22.   ?Per pharmacy likely culprits  ? - ? Due to ancef since 02/15/22 ? - ? Chlorhexidien wash ( ? -  ? Due to Methimazole since Nov 2022 ? -? Due to pravastatin in hospital (home med is crestor) ? ? Ibuproffen given 4/5 and 4/6 ? ?4/9 - complains of burning sesnation and pain ? ?Plan - dw Dr Linus Salmons and [facility] ? -stop chlorhexidieine  ?- stop ancef (see above) ?- steroids (hydrocort fo shock) ?- stop statin (ok to continue zetia) ?- stop ibuprofen ?- benadrl PRN ?- icep ack and fent prn for pain ?- monitor ? - if needed need stop tapazole ? ?Abdominal wound: [ pannnus chronic . Suspected primary source of admit isue - Prior to & Present on Admit ? ? ?Plan ?cont dressing.   ?WOC ? ?Pulmonary infiltrates - present on admit CXR 02/14/22 and confirmed CT 02/17/22 ? ?4/9  - maintains airway an dpulse ox on RAM ? ?Plan ?  ? ? ?DVT hx:  ? ?Plan ? - on eliquis  ? ?Pafib:  ? ?plan ?on eliquis.  ? ?Hx of CAD and hyperlipidemia - Prior to & Present on Admit ? - on home zetia, statin and plavix ?Plan ? - check trop ?- check rpeat echo urfent ?- holding zetia and statin due to rash 02/19/22 ? ?Diabetes Prior to & Present on Admit - on metformin, jardoiance, ?Hypoglycemia:  02/18/22 - p  Related to worsening sepsis possibly.   ? ?Plan ?D5 fluids ?ssi ? ?Hx Hyperthyroidism - tapazole at home per triad notes ? ?4/6 - TSH 0.182 and Low could be critical illenss ? ?Plan ? - cotninue tapazole for now (but might need to hold due to rash) ?- recheck TSH, FT4 02/20/22 ? ? ? ?Best Practice (right click and "Reselect all SmartList Selections" daily)  ? ?Diet/type: Regular consistency (see orders) ?DVT prophylaxis: other ?GI prophylaxis: PPI ?Lines: N/A ?Foley:  N/A ?Code Status:  full code ?Last date of multidisciplinary goals of care discussion []  - upated bedside  02/19/22. Called daughter [name] 9:14 am 02/19/22 on [phone].- no VM option and no one pickedup ? ? ? ? ? ?[place]  ? ?The patient Mannie Glynn is critically ill with multiple organ systems failure and requires high complexity decision making for assessment and support, frequent evaluation and titration of therapies, application of advanced monitoring technologies and extensive interpretation of multiple databases.  ? ?  Critical Care Time devoted to patient care services described in this note is  60  Minutes. This time reflects time of care of this signee Dr Brand Males. This critical care time does not reflect procedure time, or teaching time or supervisory time of PA/NP/Med student/Med Resident etc but could involve care discussion time  ? ? ? ?Dr. Brand Males, M.D., F.C.C.P ?Pulmonary and Critical Care Medicine ?Medical Director - [facility] ICU ?Staff Physician, [place] ?[place] [facility] ?Pager: [phone], If no answer or between  15:00h - 7:00h: call [phone] ? ?02/19/2022 ?8:24 AM ? ? ?LABS  ? ? ?PULMONARY ?No results for input(s): PHART, PCO2ART, PO2ART, HCO3, TCO2, O2SAT in the last 168 hours. ? ?Invalid input(s): PCO2, PO2 ? ?CBC ?Recent Labs  ?Lab 02/17/22 ?0458 02/18/22 ?0600 02/18/22 ?2349  ?HGB 12.0 10.9* 9.3*  ?HCT 39.7 35.9* 30.2*  ?WBC 12.7* 15.9* 13.9*  ?PLT 386 489* 359  ? ? ?COAGULATION ?Recent Labs  ?Lab 02/17/22 ?[id number]  ?INR 1.2  ? ? ?CARDIAC ? No results for input(s): TROPONINI in the last 168 hours. ?No results for input(s): PROBNP in the last 168 hours. ? ? ?CHEMISTRY ?Recent Labs  ?Lab 02/14/22 ?1731 02/14/22 ?2144 02/15/22 ?[id number] 02/16/22 ?0449 02/16/22 ?0749 02/17/22 ?0458 02/18/22 ?0600 02/18/22 ?2349  ?NA  --  133* 136 135  --  135 137 133*  ?K  --  2.7* 3.9 3.6  --  4.5 4.0 4.4  ?CL  --  101 105 102  --  99 105 102  ?CO2  --  27 26 25   --  29 28 29   ?GLUCOSE  --  208* 248* 140*  --  138* 47* 142*  ?BUN   --  7* 5* 5*  --  7* 5* 8  ?CREATININE  --  0.50 0.58 0.54  --  0.57 0.52 0.57  ?CALCIUM  --  7.9* 8.4* 8.4*  --  8.2* 8.1* 7.5*  ?MG 1.6*  --  1.6*  --  1.6* 2.2  --   --   ?PHOS  --  1.5* 1.9*  --  2.4*

## 2022-02-19 NOTE — Progress Notes (Signed)
Patient stated she feels as if she is burning. Nurse notified Conni Elliot, MD, provider is in route to bedside. See MAR for meds patient has received. No new orders at this time. Will continue to monitor.  ?

## 2022-02-19 NOTE — Progress Notes (Addendum)
? ? ?  BRIEF OVERNIGHT PROGRESS REPORT ? ?71 y.o. female admitted on 02/14/2022 with weakness x 4 days, poor po intake and cough, draining from pannus wound.  MSSA bactremia +, TTE negative, pending TEE for further evaluation. Patient given IV fluids and started on broad-spectrum antibiotics which was narrowed to cefazolin per ID.  Patient remained hypotensive despite IVF boluses and now with worsening sepsis requiring transfer to the ICU for vasopressor. PCCM consulted for management. ? ? ? ?Webb Silversmith, DNP, CCRN, FNP-C, AGACNP-BC ?Acute Care Nurse Practitioner  ? Pulmonary & Critical Care Medicine ?Pager: [phone] ?[place] [facility] ? ? ? ? ?

## 2022-02-19 NOTE — Progress Notes (Signed)
Pt transferred to ICU per orders, see notes. ?

## 2022-02-19 NOTE — Significant Event (Signed)
Overnight events noted. Pt decompensated and became hemodynamically unstable, since transferred to ICU and now on pressor support. Abx broadened overnight. Of note, new "burning" rash noted throughout most of pt's body across that was not evident prior to this AM. Pt appeared mostly pale during day on 4/8, confirmed with floor nurse who had seen pt as well.  ?

## 2022-02-19 NOTE — Progress Notes (Signed)
? ?Lab review ? ?- rash still + ?- Mag is low ? - DIC panel all c/w sepsis (wbc up, d dimer up, lactate nl, no schisto, fibrogen - nl) ?- coox - nromal ? ?Plan ? Replete mag ?Hold off chg bath for atlest 24h ?[place] echo results ? ? ? ?SIGNATURE  ? ? ?Dr. Kalman Shan, M.D., F.C.C.P,  ?Pulmonary and Critical Care Medicine ?Staff Physician, [facility] ?Center Director - Interstitial Lung Disease  Program  ?Medical Director - [facility] ?[facility] ?[place], [place], [address] ? ?NPI Number:  NPI #[id number] ?DEA Number: [id number] ? ?Pager: [phone], If no answer  -> Check AMION or Try [phone] ?Telephone (clinical office): [phone] ?Telephone (research): [phone] ? ?11:01 AM ?02/19/2022 ? ? ? ? ?LABS  ? ? ?PULMONARY ?Recent Labs  ?Lab 02/19/22 ?0840 02/19/22 ?0539  ?PHART 7.41  --   ?PCO2ART 46  --   ?PO2ART 67*  --   ?HCO3 29.2*  --   ?O2SAT 93.7 80  ? ? ?CBC ?Recent Labs  ?Lab 02/18/22 ?0600 02/18/22 ?2349 02/19/22 ?0848  ?HGB 10.9* 9.3* 10.6*  ?HCT 35.9* 30.2* 34.7*  ?WBC 15.9* 13.9* 20.9*  ?PLT 489* 359 394  407*  ? ? ?COAGULATION ?Recent Labs  ?Lab 02/17/22 ?0846 02/19/22 ?0848  ?INR 1.2 1.5*  ? ? ?CARDIAC ? No results for input(s): TROPONINI in the last 168 hours. ?No results for input(s): PROBNP in the last 168 hours. ? ? ?CHEMISTRY ?Recent Labs  ?Lab 02/14/22 ?1731 02/14/22 ?2144 02/15/22 ?7673 02/16/22 ?0449 02/16/22 ?0749 02/17/22 ?0458 02/18/22 ?0600 02/18/22 ?2349 02/19/22 ?0848  ?NA  --  133* 136 135  --  135 137 133* 134*  ?K  --  2.7* 3.9 3.6  --  4.5 4.0 4.4 4.2  ?CL  --  101 105 102  --  99 105 102 103  ?CO2  --  27 26 25   --  29 28 29 27   ?GLUCOSE  --  208* 248* 140*  --  138* 47* 142* 216*  ?BUN  --  7* 5* 5*  --  7* 5* 8 8  ?CREATININE  --  0.50 0.58 0.54  --  0.57 0.52 0.57 0.58  ?CALCIUM  --  7.9* 8.4* 8.4*  --  8.2* 8.1* 7.5* 7.6*  ?MG 1.6*  --  1.6*  --  1.6* 2.2  --   --  1.5*  ?PHOS  --  1.5* 1.9*  --   2.4*  --   --   --   --   ? ?Estimated Creatinine Clearance: 63.5 mL/min (by C-G formula based on SCr of 0.58 mg/dL). ? ? ?LIVER ?Recent Labs  ?Lab 02/14/22 ?1303 02/14/22 ?2144 02/16/22 ?0449 02/17/22 ?0458 02/17/22 ?04/19/22 02/18/22 ?0600 02/19/22 ?0848  ?AST 13*  --  10* 13*  --  11* 10*  ?ALT 8  --  6 6  --  5 <5  ?ALKPHOS 72  --  73 72  --  71 71  ?BILITOT 0.8  --  0.3 0.3  --  0.1* 0.3  ?PROT 6.6  --  6.7 6.5  --  6.2* 5.7*  ?ALBUMIN 2.4* 2.3* 2.4* 2.3*  --  2.1* 1.9*  ?INR  --   --   --   --  1.2  --  1.5*  ? ? ? ?INFECTIOUS ?Recent Labs  ?Lab 02/18/22 ?1903 02/18/22 ?2349 02/19/22 ?0848  ?  LATICACIDVEN 3.0* 1.5 1.4  ?PROCALCITON  --  0.16 0.25  ? ? ? ?ENDOCRINE ?CBG (last 3)  ?Recent Labs  ?  02/19/22 ?0000 02/19/22 ?0809 02/19/22 ?0957  ?GLUCAP 122* 162* 173*  ? ? ? ? ? ? ? ?IMAGING x48h  - image(s) personally visualized  -   highlighted in bold ?CT Angio Chest Pulmonary Embolism (PE) W or WO Contrast ? ?Result Date: 02/17/2022 ?CLINICAL DATA:  Pulmonary embolism suspected. New cor pulmonale on 2D echo concerning for pulmonary embolus. EXAM: CT ANGIOGRAPHY CHEST WITH CONTRAST TECHNIQUE: Multidetector CT imaging of the chest was performed using the standard protocol during bolus administration of intravenous contrast. Multiplanar CT image reconstructions and MIPs were obtained to evaluate the vascular anatomy. RADIATION DOSE REDUCTION: This exam was performed according to the departmental dose-optimization program which includes automated exposure control, adjustment of the mA and/or kV according to patient size and/or use of iterative reconstruction technique. CONTRAST:  43mL OMNIPAQUE IOHEXOL 350 MG/ML SOLN COMPARISON:  CT a chest 11/20/20 [facility] FINDINGS: Cardiovascular: Overall heart size is normal. Prominent right ventricle noted is suggested by ECHO. Patient is status post median sternotomy for CABG. Dense coronary artery calcifications are present. No significant pericardial effusion is present. Nerve  root arch and great vessels are within normal limits. Pulmonary artery opacification is excellent. No focal filling defects are present to suggest pulmonary emboli. Pulmonary artery size is within normal limits. Mediastinum/Nodes: Enlarged mediastinal nodes are stable. Prevascular node measures 10 mm short axis. Precarinal node measures 14 mm. No new adenopathy is present. No significant hilar adenopathy or axillary adenopathy is present. Lungs/Pleura: Patchy airspace opacities are present bilaterally. Focal consolidation is present in the posterolateral left upper lobe along the fissure. No discrete nodules are present. A small left pleural effusion is present. No pneumothorax is present. Upper Abdomen: Patient is status post cholecystectomy. Upper abdomen is otherwise unremarkable. Musculoskeletal: Degenerative changes are again noted within the thoracic and upper lumbar spine. Exaggerated thoracic kyphosis noted. T12 superior endplate fracture is stable. No new fractures are present. Remote left-sided rib fractures are present. No acute rib fractures are present. No chest wall mass. Review of the MIP images confirms the above findings. IMPRESSION: 1. No pulmonary embolus. 2. Patchy airspace opacities bilaterally with more focal consolidation in the posterolateral left upper lobe. Findings are consistent with multifocal pneumonia. Question viral pneumonia. 3. Small left pleural effusion. 4. Stable mediastinal adenopathy, likely reactive. 5. Coronary artery disease. 6. Remote left-sided rib fractures. 7. Stable T12 superior endplate fracture. Electronically Signed   By: Marin Roberts M.D.   On: 02/17/2022 22:05  ? ?DG CHEST PORT 1 VIEW ? ?Result Date: 02/18/2022 ?CLINICAL DATA:  Central line positioning film. EXAM: PORTABLE CHEST 1 VIEW COMPARISON:  CTA chest yesterday at 9:50 p.m. FINDINGS: 11:08 p.m., 02/18/2022. Scattered hazy airspace disease in the lung fields is again noted but was better seen on CT.  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 No vascular congestion is seen. No pleural effusion. A right IJ central line is in place. The tip is in the right atrium. There is osteopenia. No pneumothorax. IMPRESSION: Right IJ central line tip in the right atrium. No pneumothorax. Scattered hazy airspace disease. Electronically Signed   By: Almira Bar M.D.   On: 02/18/2022 23:44   ? ?

## 2022-02-19 NOTE — Progress Notes (Signed)
Patient arrived to ICU room 1236 with personnel belonings. Nurse noted that patient had a purse and cell phone (and [employer]vertising account planner) along with those items. Primary nurse and Arline Asp, RN counted 300 hundred dollars in patient's wallet. Will continue to monitor. ?

## 2022-02-19 NOTE — Procedures (Signed)
Arterial Catheter Insertion Procedure Note ? ?Kelly Lee  ?[id number]  ?05/23/1951 ? ?Date:02/19/22  ?Time:12:10 AM  ? ? ?Provider Performing: Berton Bon  ? ? ?Procedure: Insertion of Arterial Line (39767) without US guidance ? ?Indication(s) ?Blood pressure monitoring and/or need for frequent ABGs ? ?Consent ?Risks of the procedure as well as the alternatives and risks of each were explained to the patient and/or caregiver.  Consent for the procedure was obtained and is signed in the bedside chart ? ?Anesthesia ?None ? ? ?Time Out ?Verified patient identification, verified procedure, site/side was marked, verified correct patient position, special equipment/implants available, medications/allergies/relevant history reviewed, required imaging and test results available. ? ? ?Sterile Technique ?Maximal sterile technique including full sterile barrier drape, hand hygiene, sterile gown, sterile gloves, mask, hair covering, sterile ultrasound probe cover (if used). ? ? ?Procedure Description ?Area of catheter insertion was cleaned with chlorhexidine and draped in sterile fashion. With real-time ultrasound guidance an arterial catheter was placed into the left radial artery.  Appropriate arterial tracings confirmed on monitor.   ? ? ?Complications/Tolerance ?None; patient tolerated the procedure well. ? ? ?EBL ?Minimal ? ? ?Specimen(s) ?None ? ?

## 2022-02-19 NOTE — Progress Notes (Signed)
Pharmacy Antibiotic Note ? ?Kelly Lee is a 71 y.o. female admitted on 02/14/2022 with weakness x 4 days, poor po intake and cough, draining from pannus wound.  MSSA bactremia found.  Pt now with Hypotension, fever: worsening sepsis, Pharmacy consulted to dose vancomcyin and cefepime ? ?Plan: ?Vancomycin 1500mg  IV x 1 then 1000mg  q24h (AUC 523, used Scr 0.8) ?Cefepime 2gm IV q8h ?Follow renal function, cultures and clinical course ? ?Height: 5\' 1"  (154.9 cm) ?Weight: 86.5 kg (190 lb 11.2 oz) ?IBW/kg (Calculated) : 47.8 ? ?Temp (24hrs), Avg:98.7 ?F (37.1 ?C), Min:97.5 ?F (36.4 ?C), Max:100.7 ?F (38.2 ?C) ? ?Recent Labs  ?Lab 02/14/22 ?1242 02/14/22 ?1303 02/15/22 ?[id number] 02/16/22 ?[id number] 02/17/22 ?[id number] 02/17/22 ?[id number] 02/17/22 ?1820 02/17/22 ?2042 02/18/22 ?0600 02/18/22 ?1903 02/18/22 ?2349  ?WBC 9.6  --   --  6.2 12.7*  --   --   --  15.9*  --  13.9*  ?CREATININE  --    < > 0.58 0.54 0.57  --   --   --  0.52  --  0.57  ?LATICACIDVEN  --    < >  --   --   --  3.3* 1.5 2.0*  --  3.0* 1.5  ? < > = values in this interval not displayed.  ?  ?Estimated Creatinine Clearance: 63.5 mL/min (by C-G formula based on SCr of 0.57 mg/dL).   ? ?Allergies  ?Allergen Reactions  ? Penicillins Hives and Shortness Of Breath  ? Azithromycin Hives  ? Cephalexin Hives  ?  Tolerated Ancef 2022   ? Silvadene [Silver Sulfadiazine] Other (See Comments)  ?  "Burns" the skin  ? Wound Dressing Adhesive Rash  ? ? ?Antimicrobials this admission:  ?Levaquin 4/4 >> 4/5 ?Ancef 4/5 >> 4/9 ?Vanc 4/9 >> ?Cefepime 4/9 >> ?  ?Microbiology results:  ?4/4 BCx: now 2/3 MSSA, BCID w/ Staph aureus no resistance ?4/4 COVID/flu: negative ?4/5 Legionella: ?4/5 Strep pneumo: negative ?4/5 MRSA PCR: pos ?4/6 Bcx: NGTD ?4/8 MRSA PCR : ? ?Thank you for allowing pharmacy to be a part of this patient?s care. ? ?Dolly Rias RPh ?02/19/2022, 2:06 AM ? ?

## 2022-02-19 NOTE — Progress Notes (Signed)
Provider Stark Klein, NP gave verbal ok for nurse to use central line. Will continue to monitor. ?

## 2022-02-19 NOTE — Progress Notes (Signed)
?  [facility] ? ? ?Reason for visit: Follow up on bacteremia ? ?Interval History: events overnight noted with diffuse, new rash, hypotension requiring pressor support.  No in the ICU. WBC increasing and up to 20.9.   ? Day 6 total antibiotics ? ? ?Physical Exam: ?Constitutional:  ?Vitals:  ? 02/19/22 1200 02/19/22 1212  ?BP:    ?Pulse: 91   ?Resp: (!) 0   ?Temp:  98.5 ?F (36.9 ?C)  ?SpO2: 91%   ? patient appears in NAD ?Respiratory: Normal respiratory effort; CTA B ?Cardiovascular: RRR ?Skin: rash on upper arms, legs at inner thigh; some raised areas on right arm; swelling of both arms ? ?Review of Systems: ?Constitutional: negative for fevers and chills ?Gastrointestinal: negative for diarrhea ? ?Lab Results  ?Component Value Date  ? WBC 20.9 (H) 02/19/2022  ? HGB 10.6 (L) 02/19/2022  ? HCT 34.7 (L) 02/19/2022  ? MCV 88.3 02/19/2022  ? PLT 407 (H) 02/19/2022  ? PLT 394 02/19/2022  ?  ?Lab Results  ?Component Value Date  ? CREATININE 0.58 02/19/2022  ? BUN 8 02/19/2022  ? NA 134 (L) 02/19/2022  ? K 4.2 02/19/2022  ? CL 103 02/19/2022  ? CO2 27 02/19/2022  ?  ?Lab Results  ?Component Value Date  ? ALT <5 02/19/2022  ? AST 10 (L) 02/19/2022  ? ALKPHOS 71 02/19/2022  ?  ? ?Microbiology: ?Recent Results (from the past 240 hour(s))  ?Resp Panel by RT-PCR (Flu A&B, Covid) Nasopharyngeal Swab     Status: None  ? Collection Time: 02/14/22  1:45 PM  ? Specimen: Nasopharyngeal Swab; Nasopharyngeal(NP) swabs in vial transport medium  ?Result Value Ref Range Status  ? SARS Coronavirus 2 by RT PCR NEGATIVE NEGATIVE Final  ?  Comment: (NOTE) ?SARS-CoV-2 target nucleic acids are NOT DETECTED. ? ?The SARS-CoV-2 RNA is generally detectable in upper respiratory ?specimens during the acute phase of infection. The lowest ?concentration of SARS-CoV-2 viral copies this assay can detect is ?138 copies/mL. A negative result does not preclude SARS-Cov-2 ?infection and should not be used as the sole basis for  treatment or ?other patient management decisions. A negative result may occur with  ?improper specimen collection/handling, submission of specimen other ?than nasopharyngeal swab, presence of viral mutation(s) within the ?areas targeted by this assay, and inadequate number of viral ?copies(<138 copies/mL). A negative result must be combined with ?clinical observations, patient history, and epidemiological ?information. The expected result is Negative. ? ?Fact Sheet for Patients:  ?EntrepreneurPulse.com.au ? ?Fact Sheet for Healthcare Providers:  ?IncredibleEmployment.be ? ?This test is no t yet approved or cleared by the [place] FDA and  ?has been authorized for detection and/or diagnosis of SARS-CoV-2 by ?FDA under an Emergency Use Authorization (EUA). This EUA will remain  ?in effect (meaning this test can be used) for the duration of the ?COVID-19 declaration under Section 564(b)(1) of the Act, 21 ?U.S.C.section 360bbb-3(b)(1), unless the authorization is terminated  ?or revoked sooner.  ? ? ?  ? Influenza A by PCR NEGATIVE NEGATIVE Final  ? Influenza B by PCR NEGATIVE NEGATIVE Final  ?  Comment: (NOTE) ?The Xpert Xpress SARS-CoV-2/FLU/RSV plus assay is intended as an aid ?in the diagnosis of influenza from Nasopharyngeal swab specimens and ?should not be used as a sole basis for treatment. Nasal washings and ?aspirates are unacceptable for Xpert Xpress SARS-CoV-2/FLU/RSV ?testing. ? ?Fact Sheet for Patients: ?EntrepreneurPulse.com.au ? ?Fact Sheet for Healthcare Providers: ?IncredibleEmployment.be ? ?This test is not yet approved  or cleared by the [place] and ?has been authorized for detection and/or diagnosis of SARS-CoV-2 by ?FDA under an Emergency Use Authorization (EUA). This EUA will remain ?in effect (meaning this test can be used) for the duration of the ?COVID-19 declaration under Section 564(b)(1) of the Act, 21  U.S.C. ?section 360bbb-3(b)(1), unless the authorization is terminated or ?revoked. ? ?Performed at [facility], [address] ?[place], [place] [address] ?  ?Culture, blood (Routine X 2) w Reflex to ID Panel     Status: Abnormal  ? Collection Time: 02/14/22  3:03 PM  ? Specimen: BLOOD  ?Result Value Ref Range Status  ? Specimen Description   Final  ?  BLOOD BLOOD LEFT WRIST ?Performed at [facility], [place] [address], [place], [place] [address] ?  ? Special Requests   Final  ?  BOTTLES DRAWN AEROBIC AND ANAEROBIC Blood Culture adequate volume ?Performed at [facility], [place] [address], [place], [place] [address] ?  ? Culture  Setup Time   Final  ?  GRAM POSITIVE COCCI ?IN BOTH AEROBIC AND ANAEROBIC BOTTLES ?CRITICAL RESULT CALLED TO, READ BACK BY AND VERIFIED WITH: ?PHARMD NICK [place] ON 02/15/22 @ 11:35 BY DRT ?Performed at [facility], [place] [address], [place],  [address] ?  ? Culture STAPHYLOCOCCUS AUREUS (A)  Final  ? Report Status 02/17/2022 FINAL  Final  ? Organism ID, Bacteria STAPHYLOCOCCUS AUREUS  Final  ?    Susceptibility  ? Staphylococcus aureus - MIC*  ?  CIPROFLOXACIN <=0.5 SENSITIVE Sensitive   ?  ERYTHROMYCIN <=0.25 SENSITIVE Sensitive   ?  GENTAMICIN <=0.5 SENSITIVE Sensitive   ?  OXACILLIN 0.5 SENSITIVE Sensitive   ?  TETRACYCLINE <=1 SENSITIVE Sensitive   ?  VANCOMYCIN <=0.5 SENSITIVE Sensitive   ?  TRIMETH/SULFA <=10 SENSITIVE Sensitive   ?  CLINDAMYCIN <=0.25 SENSITIVE Sensitive   ?  RIFAMPIN <=0.5 SENSITIVE Sensitive   ?  Inducible Clindamycin NEGATIVE Sensitive   ?  * STAPHYLOCOCCUS AUREUS  ?Blood Culture ID Panel (Reflexed)     Status: Abnormal  ? Collection Time: 02/14/22  3:03 PM  ?Result Value Ref Range Status  ? Enterococcus faecalis NOT DETECTED NOT DETECTED Final  ? Enterococcus Faecium NOT DETECTED NOT DETECTED Final  ? Listeria monocytogenes NOT DETECTED NOT DETECTED Final  ? Staphylococcus species DETECTED (A)  NOT DETECTED Final  ?  Comment: CRITICAL RESULT CALLED TO, READ BACK BY AND VERIFIED WITH:  ? Staphylococcus aureus (BCID) DETECTED (A) NOT DETECTED Final  ?  Comment: CRITICAL RESULT CALLED TO, READ BACK BY AND VERIFIED WITH:  ? Staphylococcus epidermidis NOT DETECTED NOT DETECTED Final  ? Staphylococcus lugdunensis NOT DETECTED NOT DETECTED Final  ? Streptococcus species NOT DETECTED NOT DETECTED Final  ? Streptococcus agalactiae NOT DETECTED NOT DETECTED Final  ? Streptococcus pneumoniae NOT DETECTED NOT DETECTED Final  ? Streptococcus pyogenes NOT DETECTED NOT DETECTED Final  ? A.calcoaceticus-baumannii NOT DETECTED NOT DETECTED Final  ? Bacteroides fragilis NOT DETECTED NOT DETECTED Final  ? Enterobacterales NOT DETECTED NOT DETECTED Final  ? Enterobacter cloacae complex NOT DETECTED NOT DETECTED Final  ? Escherichia coli NOT DETECTED NOT DETECTED Final  ? Klebsiella aerogenes NOT DETECTED NOT DETECTED Final  ? Klebsiella oxytoca NOT DETECTED NOT DETECTED Final  ? Klebsiella pneumoniae NOT DETECTED NOT DETECTED Final  ? Proteus species NOT DETECTED NOT DETECTED Final  ? Salmonella species NOT DETECTED NOT DETECTED Final  ? Serratia marcescens NOT DETECTED  NOT DETECTED Final  ? Haemophilus influenzae NOT DETECTED NOT DETECTED Final  ? Neisseria meningitidis NOT DETECTED NOT DETECTED Final  ? Pseudomonas aeruginosa NOT DETECTED NOT DETECTED Final  ? Stenotrophomonas maltophilia NOT DETECTED NOT DETECTED Final  ? Candida albicans NOT DETECTED NOT DETECTED Final  ? Candida auris NOT DETECTED NOT DETECTED Final  ? Candida glabrata NOT DETECTED NOT DETECTED Final  ? Candida krusei NOT DETECTED NOT DETECTED Final  ? Candida parapsilosis NOT DETECTED NOT DETECTED Final  ? Candida tropicalis NOT DETECTED NOT DETECTED Final  ? Cryptococcus neoformans/gattii NOT DETECTED NOT DETECTED Final  ? Meth resistant mecA/C and MREJ NOT DETECTED NOT DETECTED Final  ?  Comment: Performed at [facility] [address]  [place], [place] [address]  ?Culture, blood (Routine X 2) w Reflex to ID Panel     Status: None (Preliminary result)  ? Collection Time: 02/14/22  5:17 PM  ? Specimen: BLOOD  ?Result Value Ref Range Status  ? Specime

## 2022-02-20 DIAGNOSIS — J189 Pneumonia, unspecified organism: Secondary | ICD-10-CM | POA: Diagnosis not present

## 2022-02-20 DIAGNOSIS — T50905S Adverse effect of unspecified drugs, medicaments and biological substances, sequela: Secondary | ICD-10-CM

## 2022-02-20 DIAGNOSIS — Z86718 Personal history of other venous thrombosis and embolism: Secondary | ICD-10-CM | POA: Diagnosis not present

## 2022-02-20 DIAGNOSIS — R6521 Severe sepsis with septic shock: Secondary | ICD-10-CM | POA: Diagnosis not present

## 2022-02-20 DIAGNOSIS — A419 Sepsis, unspecified organism: Secondary | ICD-10-CM

## 2022-02-20 DIAGNOSIS — R7881 Bacteremia: Secondary | ICD-10-CM | POA: Diagnosis not present

## 2022-02-20 DIAGNOSIS — T50905A Adverse effect of unspecified drugs, medicaments and biological substances, initial encounter: Secondary | ICD-10-CM

## 2022-02-20 LAB — CBC WITH DIFFERENTIAL/PLATELET
Abs Immature Granulocytes: 0.08 10*3/uL — ABNORMAL HIGH (ref 0.00–0.07)
Basophils Absolute: 0 10*3/uL (ref 0.0–0.1)
Basophils Relative: 0 %
Eosinophils Absolute: 0 10*3/uL (ref 0.0–0.5)
Eosinophils Relative: 0 %
HCT: 27.1 % — ABNORMAL LOW (ref 36.0–46.0)
Hemoglobin: 8.5 g/dL — ABNORMAL LOW (ref 12.0–15.0)
Immature Granulocytes: 1 %
Lymphocytes Relative: 9 %
Lymphs Abs: 1.2 10*3/uL (ref 0.7–4.0)
MCH: 27.6 pg (ref 26.0–34.0)
MCHC: 31.4 g/dL (ref 30.0–36.0)
MCV: 88 fL (ref 80.0–100.0)
Monocytes Absolute: 0.7 10*3/uL (ref 0.1–1.0)
Monocytes Relative: 5 %
Neutro Abs: 11.6 10*3/uL — ABNORMAL HIGH (ref 1.7–7.7)
Neutrophils Relative %: 85 %
Platelets: 375 10*3/uL (ref 150–400)
RBC: 3.08 MIL/uL — ABNORMAL LOW (ref 3.87–5.11)
RDW: 16 % — ABNORMAL HIGH (ref 11.5–15.5)
WBC: 13.6 10*3/uL — ABNORMAL HIGH (ref 4.0–10.5)
nRBC: 0 % (ref 0.0–0.2)

## 2022-02-20 LAB — CBC
HCT: 29.9 % — ABNORMAL LOW (ref 36.0–46.0)
Hemoglobin: 9.5 g/dL — ABNORMAL LOW (ref 12.0–15.0)
MCH: 27.9 pg (ref 26.0–34.0)
MCHC: 31.8 g/dL (ref 30.0–36.0)
MCV: 87.7 fL (ref 80.0–100.0)
Platelets: 370 10*3/uL (ref 150–400)
RBC: 3.41 MIL/uL — ABNORMAL LOW (ref 3.87–5.11)
RDW: 15.9 % — ABNORMAL HIGH (ref 11.5–15.5)
WBC: 18.2 10*3/uL — ABNORMAL HIGH (ref 4.0–10.5)
nRBC: 0 % (ref 0.0–0.2)

## 2022-02-20 LAB — TROPONIN I (HIGH SENSITIVITY): Troponin I (High Sensitivity): 10 ng/L (ref <18)

## 2022-02-20 LAB — GLUCOSE, CAPILLARY
Glucose-Capillary: 138 mg/dL — ABNORMAL HIGH (ref 70–99)
Glucose-Capillary: 184 mg/dL — ABNORMAL HIGH (ref 70–99)
Glucose-Capillary: 260 mg/dL — ABNORMAL HIGH (ref 70–99)
Glucose-Capillary: 250 mg/dL — ABNORMAL HIGH (ref 70–99)
Glucose-Capillary: 212 mg/dL — ABNORMAL HIGH (ref 70–99)
Glucose-Capillary: 155 mg/dL — ABNORMAL HIGH (ref 70–99)
Glucose-Capillary: 194 mg/dL — ABNORMAL HIGH (ref 70–99)

## 2022-02-20 LAB — COMPREHENSIVE METABOLIC PANEL
ALT: <5 U/L (ref 0–44)
AST: 8 U/L — ABNORMAL LOW (ref 15–41)
Albumin: 1.9 g/dL — ABNORMAL LOW (ref 3.5–5.0)
Alkaline Phosphatase: 70 U/L (ref 38–126)
Anion gap: 3 — ABNORMAL LOW (ref 5–15)
BUN: 7 mg/dL — ABNORMAL LOW (ref 8–23)
CO2: 28 mmol/L (ref 22–32)
Calcium: 7.9 mg/dL — ABNORMAL LOW (ref 8.9–10.3)
Chloride: 102 mmol/L (ref 98–111)
Creatinine, Ser: 0.47 mg/dL (ref 0.44–1.00)
GFR, Estimated: >60 mL/min (ref >60)
Glucose, Bld: 276 mg/dL — ABNORMAL HIGH (ref 70–99)
Potassium: 4.8 mmol/L (ref 3.5–5.1)
Sodium: 133 mmol/L — ABNORMAL LOW (ref 135–145)
Total Bilirubin: 0.5 mg/dL (ref 0.3–1.2)
Total Protein: 5.6 g/dL — ABNORMAL LOW (ref 6.5–8.1)

## 2022-02-20 LAB — LACTIC ACID, PLASMA: Lactic Acid, Venous: 1 mmol/L (ref 0.5–1.9)

## 2022-02-20 LAB — TSH: TSH: 0.029 u[IU]/mL — ABNORMAL LOW (ref 0.350–4.500)

## 2022-02-20 LAB — T4, FREE: Free T4: 1.52 ng/dL — ABNORMAL HIGH (ref 0.61–1.12)

## 2022-02-20 MED ORDER — PREDNISONE 10 MG PO TABS
30.0000 mg | ORAL_TABLET | Freq: Every day | ORAL | Status: AC
  Administered 2022-02-21 – 2022-02-23 (×3): 30 mg via ORAL
  Filled 2022-02-20 (×3): qty 1

## 2022-02-20 MED ORDER — FAMOTIDINE 20 MG PO TABS
20.0000 mg | ORAL_TABLET | Freq: Two times a day (BID) | ORAL | Status: DC
  Administered 2022-02-20 – 2022-03-02 (×21): 20 mg via ORAL
  Filled 2022-02-20 (×21): qty 1

## 2022-02-20 MED ORDER — KETOROLAC TROMETHAMINE 15 MG/ML IJ SOLN
15.0000 mg | Freq: Once | INTRAMUSCULAR | Status: AC
  Administered 2022-02-20: 15 mg via INTRAVENOUS
  Filled 2022-02-20: qty 1

## 2022-02-20 MED ORDER — HYDROCERIN EX CREA
TOPICAL_CREAM | Freq: Two times a day (BID) | CUTANEOUS | Status: DC
  Filled 2022-02-20: qty 113

## 2022-02-20 MED ORDER — HYDROCERIN EX CREA
TOPICAL_CREAM | Freq: Two times a day (BID) | CUTANEOUS | Status: DC | PRN
  Administered 2022-02-20 – 2022-02-22 (×4): 1 via TOPICAL
  Filled 2022-02-20: qty 113

## 2022-02-20 NOTE — Progress Notes (Signed)
? ? ? ? ? ? ?Subjective: ?Complaining of intense burning rash ? ? ?Antibiotics:  ?Anti-infectives (From admission, onward)  ? ? Start     Dose/Rate Route Frequency Ordered Stop  ? 02/20/22 0600  vancomycin (VANCOCIN) IVPB 1000 mg/200 mL premix       ? 1,000 mg ?200 mL/hr over 60 Minutes Intravenous Every 24 hours 02/19/22 1552    ? 02/20/22 0200  vancomycin (VANCOCIN) IVPB 1000 mg/200 mL premix  Status:  Discontinued       ? 1,000 mg ?200 mL/hr over 60 Minutes Intravenous  Once 02/19/22 0209 02/19/22 1552  ? 02/19/22 0215  vancomycin (VANCOREADY) IVPB 1500 mg/300 mL       ? 1,500 mg ?150 mL/hr over 120 Minutes Intravenous  Once 02/19/22 0120 02/19/22 0437  ? 02/19/22 0215  ceFEPIme (MAXIPIME) 2 g in sodium chloride 0.9 % 100 mL IVPB  Status:  Discontinued       ? 2 g ?200 mL/hr over 30 Minutes Intravenous Every 8 hours 02/19/22 0128 02/19/22 0823  ? 02/15/22 1500  levofloxacin (LEVAQUIN) IVPB 750 mg  Status:  Discontinued       ? 750 mg ?100 mL/hr over 90 Minutes Intravenous Every 24 hours 02/14/22 1712 02/15/22 1222  ? 02/15/22 1400  ceFAZolin (ANCEF) IVPB 2g/100 mL premix  Status:  Discontinued       ? 2 g ?200 mL/hr over 30 Minutes Intravenous Every 8 hours 02/15/22 1222 02/19/22 0823  ? 02/14/22 1500  levofloxacin (LEVAQUIN) IVPB 750 mg       ? 750 mg ?100 mL/hr over 90 Minutes Intravenous  Once 02/14/22 1453 02/14/22 1606  ? ?  ? ? ?Medications: ?Scheduled Meds: ? apixaban  5 mg Oral BID  ? calcium carbonate  1 tablet Oral BID  ? clopidogrel  75 mg Oral Daily  ? ezetimibe  10 mg Oral QHS  ? famotidine  20 mg Oral BID  ? guaiFENesin  600 mg Oral BID  ? insulin aspart  0-20 Units Subcutaneous TID WC  ? insulin aspart  0-5 Units Subcutaneous QHS  ? methimazole  5 mg Oral Daily  ? midodrine  10 mg Oral TID WC  ? mupirocin ointment  1 application. Nasal BID  ? pantoprazole  40 mg Oral QAC breakfast  ? potassium chloride  40 mEq Oral Daily  ? [START ON 02/21/2022] predniSONE  30 mg Oral Q breakfast  ? ?Continuous  Infusions: ? sodium chloride    ? vancomycin Stopped (02/20/22 8469)  ? ?PRN Meds:.Place/Maintain arterial line **AND** sodium chloride, acetaminophen **OR** acetaminophen, albuterol, chlorpheniramine-HYDROcodone, diphenhydrAMINE, fentaNYL (SUBLIMAZE) injection, guaiFENesin-dextromethorphan, melatonin, ondansetron **OR** ondansetron (ZOFRAN) IV ? ? ? ?Objective: ?Weight change:  ? ?Intake/Output Summary (Last 24 hours) at 02/20/2022 1521 ?Last data filed at 02/20/2022 1211 ?Gross per 24 hour  ?Intake 2370.05 ml  ?Output 3850 ml  ?Net -1479.95 ml  ? ?Blood pressure (!) 110/40, pulse 94, temperature 97.8 ?F (36.6 ?C), resp. rate 15, height 5\' 1"  (1.549 m), weight 86.5 kg, SpO2 100 %. ?Temp:  [97.8 ?F (36.6 ?C)-98.2 ?F (36.8 ?C)] 97.8 ?F (36.6 ?C) (04/10 1124) ?Pulse Rate:  [75-124] 94 (04/10 1211) ?Resp:  [0-24] 15 (04/10 1211) ?BP: (110)/(40) 110/40 (04/10 1211) ?SpO2:  [96 %-100 %] 100 % (04/10 1211) ? ?Physical Exam: ?Physical Exam ?Constitutional:   ?   Appearance: She is ill-appearing.  ?Eyes:  ?   Extraocular Movements: Extraocular movements intact.  ?Cardiovascular:  ?   Rate and Rhythm: Tachycardia present.  ?  Pulmonary:  ?   Effort: Pulmonary effort is normal. No respiratory distress.  ?   Breath sounds: No wheezing.  ?Abdominal:  ?   General: There is no distension.  ?Musculoskeletal:     ?   General: Normal range of motion.  ?Skin: ?   Coloration: Skin is pale.  ?   Findings: Erythema and rash present.  ?Neurological:  ?   General: No focal deficit present.  ?   Mental Status: She is alert and oriented to person, place, and time.  ?Psychiatric:     ?   Attention and Perception: Attention normal.     ?   Mood and Affect: Mood is anxious.     ?   Speech: Speech normal.     ?   Behavior: Behavior normal.     ?   Thought Content: Thought content normal.     ?   Cognition and Memory: Cognition and memory normal.     ?   Judgment: Judgment normal.  ?  ?Rash 02/20/2022: ? ? ? ? ? ? ?CBC: ? ? ? ?BMET ?Recent Labs  ?   02/19/22 ?0848 02/20/22 ?0655  ?NA 134* 133*  ?K 4.2 4.8  ?CL 103 102  ?CO2 27 28  ?GLUCOSE 216* 276*  ?BUN 8 7*  ?CREATININE 0.58 0.47  ?CALCIUM 7.6* 7.9*  ? ? ? ?Liver Panel ? ?Recent Labs  ?  02/19/22 ?0848 02/20/22 ?0655  ?PROT 5.7* 5.6*  ?ALBUMIN 1.9* 1.9*  ?AST 10* 8*  ?ALT <5 <5  ?ALKPHOS 71 70  ?BILITOT 0.3 0.5  ? ? ? ? ? ?Sedimentation Rate ?No results for input(s): ESRSEDRATE in the last 72 hours. ?C-Reactive Protein ?No results for input(s): CRP in the last 72 hours. ? ?Micro Results: ?Recent Results (from the past 720 hour(s))  ?Resp Panel by RT-PCR (Flu A&B, Covid) Nasopharyngeal Swab     Status: None  ? Collection Time: 02/14/22  1:45 PM  ? Specimen: Nasopharyngeal Swab; Nasopharyngeal(NP) swabs in vial transport medium  ?Result Value Ref Range Status  ? SARS Coronavirus 2 by RT PCR NEGATIVE NEGATIVE Final  ?  Comment: (NOTE) ?SARS-CoV-2 target nucleic acids are NOT DETECTED. ? ?The SARS-CoV-2 RNA is generally detectable in upper respiratory ?specimens during the acute phase of infection. The lowest ?concentration of SARS-CoV-2 viral copies this assay can detect is ?138 copies/mL. A negative result does not preclude SARS-Cov-2 ?infection and should not be used as the sole basis for treatment or ?other patient management decisions. A negative result may occur with  ?improper specimen collection/handling, submission of specimen other ?than nasopharyngeal swab, presence of viral mutation(s) within the ?areas targeted by this assay, and inadequate number of viral ?copies(<138 copies/mL). A negative result must be combined with ?clinical observations, patient history, and epidemiological ?information. The expected result is Negative. ? ?Fact Sheet for Patients:  ?BloggerCourse.com ? ?Fact Sheet for Healthcare Providers:  ?SeriousBroker.it ? ?This test is no t yet approved or cleared by the [place] FDA and  ?has been authorized for detection and/or  diagnosis of SARS-CoV-2 by ?FDA under an Emergency Use Authorization (EUA). This EUA will remain  ?in effect (meaning this test can be used) for the duration of the ?COVID-19 declaration under Section 564(b)(1) of the Act, 21 ?U.S.C.section 360bbb-3(b)(1), unless the authorization is terminated  ?or revoked sooner.  ? ? ?  ? Influenza A by PCR NEGATIVE NEGATIVE Final  ? Influenza B by PCR NEGATIVE NEGATIVE Final  ?  Comment: (NOTE) ?  The Xpert Xpress SARS-CoV-2/FLU/RSV plus assay is intended as an aid ?in the diagnosis of influenza from Nasopharyngeal swab specimens and ?should not be used as a sole basis for treatment. Nasal washings and ?aspirates are unacceptable for Xpert Xpress SARS-CoV-2/FLU/RSV ?testing. ? ?Fact Sheet for Patients: ?BloggerCourse.comhttps://www.fda.gov/media/152166/download ? ?Fact Sheet for Healthcare Providers: ?SeriousBroker.ithttps://www.fda.gov/media/152162/download ? ?This test is not yet approved or cleared by the [place] FDA and ?has been authorized for detection and/or diagnosis of SARS-CoV-2 by ?FDA under an Emergency Use Authorization (EUA). This EUA will remain ?in effect (meaning this test can be used) for the duration of the ?COVID-19 declaration under Section 564(b)(1) of the Act, 21 U.S.C. ?section 360bbb-3(b)(1), unless the authorization is terminated or ?revoked. ? ?Performed at [facility], [address] ?[place], [place] [address] ?  ?Culture, blood (Routine X 2) w Reflex to ID Panel     Status: Abnormal  ? Collection Time: 02/14/22  3:03 PM  ? Specimen: BLOOD  ?Result Value Ref Range Status  ? Specimen Description   Final  ?  BLOOD BLOOD LEFT WRIST ?Performed at [facility], [address] [place], [place] [address] ?  ? Special Requests   Final  ?  BOTTLES DRAWN AEROBIC AND ANAEROBIC Blood Culture adequate volume ?Performed at [facility], [address] [place], [place] [address] ?  ? Culture  Setup Time   Final  ?  GRAM POSITIVE  COCCI ?IN BOTH AEROBIC AND ANAEROBIC BOTTLES ?CRITICAL RESULT CALLED TO, READ BACK BY AND VERIFIED WITH: ?PHARMD NICK GLOGOVAC ON 02/15/22 @ 11:35 BY DRT ?Performed at [facility], [address] [place]

## 2022-02-20 NOTE — Progress Notes (Addendum)
? ?NAME:  Kelly Lee, MRN:  [id number], DOB:  22-Jan-1951, LOS: 6 ?ADMISSION DATE:  02/14/2022, CONSULTATION DATE:  02/18/22 ?REFERRING MD:  hospitalist , CHIEF COMPLAINT:  weakness  ? ?History of Present Illness:  ?71 yo woman admitted on 02/14/22 with a hx of HTN, DM2.   She presented with weakness x 4 days, poor po intake and cough, draining from pannus wound.   MSSA bactremia found, thought 2/2 wound R groin and R lower leg vs PNA . Multifocal opacities on CT concerning for septic emboli. Plan for TEE Monday 02/20/22. Rapid response called 4/7 due to hypotension.  Bolus given.  Has been tachycardic during hospitalization.  ?Episodes of hypoglycemia 02/18/22 .  On D10.   This evening patient again hypotensive, continues to feel "bad".  Temp 100.  ON CCM exam BP fine when awake and talking but drops to 50s map sometimes [id number] systolic when sleeping.  - on 02/18/22 ? ?Pertinent  Medical History  ?Hip fracture 2021 ?DM ?HTN ?Pafib Takes eliquis and plavix  ?HLD ?CAD, hx CABG  ?Hyperthyroidims ?DVT  ?Chronic wound  ?Hx of artherial thrombectomy and R calf fasciotomy on 2/23 for acute RLE ischemia  ?Hx of R wrist abscess.  ? ? ?CULTURES and ABx  ? ?[place] PCR 4/4 - POSITIVe ?BCID 4/4 - STaph ?Blood culture 4/4 - MSSA ?Blood culture 4/6  - neg so far ?MRSA PCR 4/8 - POSITIVE ? ? ?ANTIBIOTICS ?Levaquin 4/4 - 4/5 ?Ancef 4/5 -4/9 (rash, burning) ?Cefepime 4/9 x 1 dose ?Vanc 4/9 (started afer rash, d/w Dr Linus Salmons) ? ? ? ? ? ? ?Significant Hospital Events: ?Including procedures, antibiotic start and stop dates in addition to other pertinent events   ?02/14/2022 - admit ?4/5 - ECHO - RV down. Gr 2 DDx ?4/7 CTA - no PE . Has Pulm infiltrates ?02/18/22- ccm consult and ICU transfer.  ?4/10 Off pressors this AM ? ?Interim History / Subjective:  ? ?Off pressors this AM, still has skin erythema, burning and itching  ? ?Objective   ?Blood pressure (!) 117/38, pulse 100, temperature 97.9 ?F (36.6 ?C), temperature source Oral, resp. rate 16, height  5\' 1"  (1.549 m), weight 86.5 kg, SpO2 100 %. ?   ?   ? ?Intake/Output Summary (Last 24 hours) at 02/20/2022 1051 ?Last data filed at 02/20/2022 [id number] ?Gross per 24 hour  ?Intake 2280.05 ml  ?Output 3500 ml  ?Net -1219.95 ml  ? ? ?Filed Weights  ? 02/15/22 0845  ?Weight: 86.5 kg  ? ? ?General:  well nourished F, awake and in no acute distress ?HEENT: MM pink/moist, sclera anicteric ?Neuro: awake and alert, oriented x3 ?CV: s1s2 rrr, no m/r/g ?PULM:  mildly decreased air entry bilaterally without wheezing or rhonchi ?GI: soft, bsx4 active  ?Extremities: warm/dry, no edema  ?Skin: diffuse erythema of the trunk and extremities with skin excoriation, no papules or bulla ? ? ? ?Resolved Hospital Problem list   ? ? ?Assessment & Plan:  ? ? ? ?MSSA Bacteremia  ?Present on Admit - due to R groin wound ?MRSA PCR positive ?-ID following, continue Vanc and follow repeat blood cultures ?-TEE scheduled for 4/11  ? ? ? ?Shock-septic vs anaphylactic ? Cortisol 4.8, procal 0.25 ?-off pressors, improving, lactate normalized ?-COOX 80% ?-stop stress dose pressors and start prednisone taper, 30mg  x 3days, 20mg  for 3days and 10mg  3days ?-ID following, suspect DRESS, adding diff for eosinophils ?-continue midodrine for now ?-ancef, chlorhexidine, motrin, statin stopped ?-add H2 blocker and continue benadryl ? ? ? ? ? ? ?  Abdominal wound and R groin wound ?Chronic followed by wound care at home, suspect source of MSSA ?-continue wound care ? ? ?CAP ?Pulmonary infiltrates - present on admit CXR 02/14/22 and confirmed CT 02/17/22 ?-got 4 days Cefazolin and one day Levaquin, respiratory status stable, monitor, avoid adding further abx right now ? ? ?History of DVT and Atrial Fibrillation ?-continue Eliquis ? ? ?Hx of CAD and hyperlipidemia ? Prior to & Present on Admit ?-continue home Zetia and plavix ?-trops negative, echo with EF 55-60% and no wall motion abnormality ? ? ? ?Diabetes ?Hold Metformin and Jardiance ?-SSI ? ? ? ?Hx Hyperthyroidism   ?TSH 0.182 and Low could be critical illenss ?-continue Tapazole, TSH slightly low and T4 little high, may need to adjust dose ? ? ? ? ?Best Practice (right click and "Reselect all SmartList Selections" daily)  ? ?Diet/type: Regular consistency (see orders) ?DVT prophylaxis: other ?GI prophylaxis: PPI ?Lines: N/A ?Foley:  N/A ?Code Status:  full code ?Last date of multidisciplinary goals of care discussion []  - pt improving, continue full code and is able to update her family ? ? ?LABS  ? ? ?PULMONARY ?Recent Labs  ?Lab 02/19/22 ?0840 02/19/22 ?[id number]  ?PHART 7.41  --   ?PCO2ART 46  --   ?PO2ART 67*  --   ?HCO3 29.2*  --   ?O2SAT 93.7 80  ? ? ?CBC ?Recent Labs  ?Lab 02/18/22 ?2349 02/19/22 ?0848 02/20/22 ?0028  ?HGB 9.3* 10.6* 9.5*  ?HCT 30.2* 34.7* 29.9*  ?WBC 13.9* 20.9* 18.2*  ?PLT 359 394  407* 370  ? ? ? ?COAGULATION ?Recent Labs  ?Lab 02/17/22 ?0846 02/19/22 ?0848  ?INR 1.2 1.5*  ? ? ? ?CARDIAC ? No results for input(s): TROPONINI in the last 168 hours. ?No results for input(s): PROBNP in the last 168 hours. ? ? ?CHEMISTRY ?Recent Labs  ?Lab 02/14/22 ?1731 02/14/22 ?2144 02/15/22 ?[id number] 02/16/22 ?0449 02/16/22 ?0749 02/17/22 ?0458 02/18/22 ?0600 02/18/22 ?2349 02/19/22 ?0848 02/20/22 ?[id number]  ?NA  --  133* 136   < >  --  135 137 133* 134* 133*  ?K  --  2.7* 3.9   < >  --  4.5 4.0 4.4 4.2 4.8  ?CL  --  101 105   < >  --  99 105 102 103 102  ?CO2  --  27 26   < >  --  29 28 29 27 28   ?GLUCOSE  --  208* 248*   < >  --  138* 47* 142* 216* 276*  ?BUN  --  7* 5*   < >  --  7* 5* 8 8 7*  ?CREATININE  --  0.50 0.58   < >  --  0.57 0.52 0.57 0.58 0.47  ?CALCIUM  --  7.9* 8.4*   < >  --  8.2* 8.1* 7.5* 7.6* 7.9*  ?MG 1.6*  --  1.6*  --  1.6* 2.2  --   --  1.5*  --   ?PHOS  --  1.5* 1.9*  --  2.4*  --   --   --   --   --   ? < > = values in this interval not displayed.  ? ? ?Estimated Creatinine Clearance: 63.5 mL/min (by C-G formula based on SCr of 0.47 mg/dL). ? ? ?LIVER ?Recent Labs  ?Lab 02/16/22 ?[id number] 02/17/22 ?0458  02/17/22 ?0846 02/18/22 ?0600 02/19/22 ?0848 02/20/22 ?[id number]  ?AST 10* 13*  --  11* 10* 8*  ?ALT 6  6  --  5 <5 <5  ?ALKPHOS 73 72  --  71 71 70  ?BILITOT 0.3 0.3  --  0.1* 0.3 0.5  ?PROT 6.7 6.5  --  6.2* 5.7* 5.6*  ?ALBUMIN 2.4* 2.3*  --  2.1* 1.9* 1.9*  ?INR  --   --  1.2  --  1.5*  --   ? ? ? ? ?INFECTIOUS ?Recent Labs  ?Lab 02/18/22 ?2349 02/19/22 ?0848 02/20/22 ?0654  ?LATICACIDVEN 1.5 1.4 1.0  ?PROCALCITON 0.16 0.25  --   ? ? ? ? ?ENDOCRINE ?CBG (last 3)  ?Recent Labs  ?  02/19/22 ?2155 02/20/22 ?0415 02/20/22 ?0757  ?GLUCAP 295* 260* 250*  ? ? ? ? ? ? ? ? ?IMAGING x48h  - image(s) personally visualized  -   highlighted in bold ?DG CHEST PORT 1 VIEW ? ?Result Date: 02/18/2022 ?CLINICAL DATA:  Central line positioning film. EXAM: PORTABLE CHEST 1 VIEW COMPARISON:  CTA chest yesterday at 9:50 p.m. FINDINGS: 11:08 p.m., 02/18/2022. Scattered hazy airspace disease in the lung fields is again noted but was better seen on CT. 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 No vascular congestion is seen. No pleural effusion. A right IJ central line is in place. The tip is in the right atrium. There is osteopenia. No pneumothorax. IMPRESSION: Right IJ central line tip in the right atrium. No pneumothorax. Scattered hazy airspace disease. Electronically Signed   By: Telford Nab M.D.   On: 02/18/2022 23:44  ? ?ECHOCARDIOGRAM LIMITED ? ?Result Date: 02/19/2022 ?   ECHOCARDIOGRAM LIMITED REPORT   Patient Name:   TORAL BESTER Date of Exam: 02/19/2022 Medical Rec #:  [id number]        Height:       61.0 in Accession #:    [id number]       Weight:       190.7 lb Date of Birth:  [birth year]         BSA:          1.851 m? Patient Age:    57 years         BP:           115/51 mmHg Patient Gender: F                HR:           104 bpm. Exam Location:  Inpatient Procedure: Cardiac Doppler, Color Doppler, Limited Echo and 3D Echo Indications:    Bacteremia  History:         Patient has prior history of Echocardiogram examinations, most                 recent 02/15/2022. CAD, Arrythmias:Atrial Flutter; Risk                 Factors:Hypertension, Diabetes and Dyslipidemia. Septic s

## 2022-02-20 NOTE — Progress Notes (Signed)
?   02/20/22 1525  ?Clinical Encounter Type  ?Visited With Patient  ?Visit Type Initial;Spiritual support;Social support  ?Referral From Other (Comment) ?(rounding)  ?Spiritual Encounters  ?Spiritual Needs Emotional  ? ?Chaplain Burris engaged Pt to check on her well-being. Chaplain Burris offered compassionate, non-anxious presence and active listening. Pt shared about her health concerns and life stressors. Chaplain B offered words of encouragement and also offered prayer at Pt request. The visit seemed to help offer some relief and was appreciated. ?

## 2022-02-21 ENCOUNTER — Inpatient Hospital Stay (HOSPITAL_COMMUNITY): Payer: Medicare Other | Admitting: General Practice

## 2022-02-21 ENCOUNTER — Encounter (HOSPITAL_COMMUNITY): Payer: Self-pay | Admitting: Internal Medicine

## 2022-02-21 ENCOUNTER — Inpatient Hospital Stay (HOSPITAL_COMMUNITY): Payer: Medicare Other

## 2022-02-21 ENCOUNTER — Encounter (HOSPITAL_COMMUNITY)
Admission: EM | Disposition: A | Discharge status: Home Health | Payer: Self-pay | Source: Home / Self Care | Attending: Family Medicine

## 2022-02-21 DIAGNOSIS — I25810 Atherosclerosis of coronary artery bypass graft(s) without angina pectoris: Secondary | ICD-10-CM

## 2022-02-21 DIAGNOSIS — I2510 Atherosclerotic heart disease of native coronary artery without angina pectoris: Secondary | ICD-10-CM

## 2022-02-21 DIAGNOSIS — I361 Nonrheumatic tricuspid (valve) insufficiency: Secondary | ICD-10-CM

## 2022-02-21 DIAGNOSIS — R7881 Bacteremia: Secondary | ICD-10-CM | POA: Diagnosis not present

## 2022-02-21 DIAGNOSIS — B9561 Methicillin susceptible Staphylococcus aureus infection as the cause of diseases classified elsewhere: Secondary | ICD-10-CM

## 2022-02-21 DIAGNOSIS — T50905S Adverse effect of unspecified drugs, medicaments and biological substances, sequela: Secondary | ICD-10-CM | POA: Diagnosis not present

## 2022-02-21 DIAGNOSIS — I340 Nonrheumatic mitral (valve) insufficiency: Secondary | ICD-10-CM | POA: Diagnosis not present

## 2022-02-21 DIAGNOSIS — I10 Essential (primary) hypertension: Secondary | ICD-10-CM

## 2022-02-21 DIAGNOSIS — J189 Pneumonia, unspecified organism: Secondary | ICD-10-CM | POA: Diagnosis not present

## 2022-02-21 DIAGNOSIS — Z7901 Long term (current) use of anticoagulants: Secondary | ICD-10-CM | POA: Diagnosis not present

## 2022-02-21 HISTORY — PX: TEE WITHOUT CARDIOVERSION: SHX5443

## 2022-02-21 LAB — CBC
HCT: 31.6 % — ABNORMAL LOW (ref 36.0–46.0)
Hemoglobin: 9.5 g/dL — ABNORMAL LOW (ref 12.0–15.0)
MCH: 27.2 pg (ref 26.0–34.0)
MCHC: 30.1 g/dL (ref 30.0–36.0)
MCV: 90.5 fL (ref 80.0–100.0)
Platelets: 445 10*3/uL — ABNORMAL HIGH (ref 150–400)
RBC: 3.49 MIL/uL — ABNORMAL LOW (ref 3.87–5.11)
RDW: 16 % — ABNORMAL HIGH (ref 11.5–15.5)
WBC: 12.5 10*3/uL — ABNORMAL HIGH (ref 4.0–10.5)
nRBC: 0 % (ref 0.0–0.2)

## 2022-02-21 LAB — GLUCOSE, CAPILLARY
Glucose-Capillary: 168 mg/dL — ABNORMAL HIGH (ref 70–99)
Glucose-Capillary: 187 mg/dL — ABNORMAL HIGH (ref 70–99)
Glucose-Capillary: 242 mg/dL — ABNORMAL HIGH (ref 70–99)

## 2022-02-21 LAB — CULTURE, BLOOD (ROUTINE X 2)
Culture: NO GROWTH
Culture: NO GROWTH

## 2022-02-21 LAB — BASIC METABOLIC PANEL
Anion gap: 4 — ABNORMAL LOW (ref 5–15)
BUN: 11 mg/dL (ref 8–23)
CO2: 28 mmol/L (ref 22–32)
Calcium: 8.2 mg/dL — ABNORMAL LOW (ref 8.9–10.3)
Chloride: 103 mmol/L (ref 98–111)
Creatinine, Ser: 0.52 mg/dL (ref 0.44–1.00)
GFR, Estimated: >60 mL/min (ref >60)
Glucose, Bld: 179 mg/dL — ABNORMAL HIGH (ref 70–99)
Potassium: 4.9 mmol/L (ref 3.5–5.1)
Sodium: 135 mmol/L (ref 135–145)

## 2022-02-21 SURGERY — ECHOCARDIOGRAM, TRANSESOPHAGEAL
Anesthesia: Monitor Anesthesia Care

## 2022-02-21 MED ORDER — SODIUM CHLORIDE 0.9 % IV SOLN: INTRAVENOUS | Status: DC

## 2022-02-21 MED ORDER — PROPOFOL 10 MG/ML IV BOLUS
INTRAVENOUS | Status: DC | PRN
  Administered 2022-02-21 (×2): 20 mg via INTRAVENOUS

## 2022-02-21 MED ORDER — PROPOFOL 500 MG/50ML IV EMUL
INTRAVENOUS | Status: DC | PRN
Start: 2022-02-21 — End: 2022-02-21
  Administered 2022-02-21: 125 ug/kg/min via INTRAVENOUS

## 2022-02-21 MED ORDER — LIDOCAINE 2% (20 MG/ML) 5 ML SYRINGE
INTRAMUSCULAR | Status: DC | PRN
  Administered 2022-02-21: 100 mg via INTRAVENOUS

## 2022-02-21 MED ORDER — SODIUM CHLORIDE 0.9 % IV SOLN
INTRAVENOUS | Status: AC | PRN
Start: 2022-02-21 — End: 2022-02-21
  Administered 2022-02-21: 20 mL via INTRAVENOUS

## 2022-02-21 MED ORDER — PHENYLEPHRINE 40 MCG/ML (10ML) SYRINGE FOR IV PUSH (FOR BLOOD PRESSURE SUPPORT)
PREFILLED_SYRINGE | INTRAVENOUS | Status: DC | PRN
Start: 2022-02-21 — End: 2022-02-21
  Administered 2022-02-21 (×3): 80 ug via INTRAVENOUS

## 2022-02-21 NOTE — Progress Notes (Signed)
?Progress Note ? ? ?Patient: Kelly Lee [id number] DOB: 10/07/1951 DOA: 02/14/2022     7 ?DOS: the patient was seen and examined on 02/21/2022 ?  ?Brief hospital course: ?71 y.o. female with medical history significant of HTN, DM2. Presenting with weakness. She reports that she has felt weak for at least the last 4 days. She has had poor PO intake during that time. She has had nausea, but no vomiting. She reports productive cough, but no fever. She tried APAP, advil and allergy meds to help; however, they didn't work. In addition, she has notice some draining from her pannus wound. When she didn't feel better today, she called EMS to take her to the ED for evaluation. ? ?Assessment and Plan: ?No notes have been filed under this hospital service. ?Service: Hospitalist ? ?RUL PNA with MSSA bacteremia with sepsis present on admission ?-Initially continued on levaquin ?-CXR findings of mid-R upper lobe airspace opacity ?-Blood cx noted to be pos for 1/4 MSSA. Incidental cor pulmonale noted on echo. Chest CTA reviewed, neg for PE, but confirms multifocal PNA ?-2d echo without evidence of vegetation ?-TEE performed on 4/11, no vegetations seen ?-Repeat blood cx neg thus far ?-ID following. Recs for total 14 days of abx with day 1 being 4/6 ?  ?Chronic pannus wound ?    - WOCN was consulted ?    - suspect source of MSSA bacteremia ?  ?Hypokalemia ?Hypocalcemia ?    - electrolytes unremarkable ? ?Hypomagnesemia ?-Most recent Mg of 1.5 on 4/9 ?-Will repeat level in AM, replace as needed with goal >2 ?  ?DM2, uncontrolled ?    - A1c, SSI, glucose checks, DM diet ?-Glycemic trends now currently stable. Recently noted to have profound hypoglycemia with glucose into the 20's ?-Home long-acting insulin remains stopped ?-Cont SSI coverage as needed ?  ?Hx of DVT ?    - continue eliquis ?  ?HTN ?    - Hypotensive recently requiring transfer to ICU and pressor support ?-Now on midodrine ?-Home metoprolol discontinued ?  ?PAF ?     - home meotprolol stopped secondary to profound hypotension requiring pressor support ?-cont eliquis, no evidence of acute blood loss ?-HR stable currently ?  ?CAD ?HLD ?    - statin per home regimen ?-Denies chest pains ?-Now off metoprolol secondary to recent profound hypotension requiring pressors ?  ?Hyperthyroidism ?    - cont methimazole per home regimen ?-TSH 0.182 ? ?New rash with hypotension ?-Concern for DRESS with beta lactams now stopped, would not give again ?-Currently on prednisone ?-Anticipation that rash would improve in the coming days ?-Hemodynamics have improved, albeit pt is on midodrine ?-Wean midodrine as tolerated over time ?  ? ?  ? ?Subjective: Complains of continued burning rash ? ?Physical Exam: ?Vitals:  ? 02/21/22 1048 02/21/22 1100 02/21/22 1215 02/21/22 1612  ?BP: 92/71 (!) 104/54    ?Pulse: (!) 110 (!) 102    ?Resp: (!) 28 14    ?Temp:   97.8 ?F (36.6 ?C) 97.9 ?F (36.6 ?C)  ?TempSrc:   Oral Oral  ?SpO2: 96% 92%    ?Weight:      ?Height:      ? ?General exam: Conversant, in no acute distress ?Respiratory system: normal chest rise, clear, no audible wheezing ?Cardiovascular system: regular rhythm, s1-s2 ?Gastrointestinal system: Nondistended, nontender, pos BS ?Central nervous system: No seizures, no tremors ?Extremities: No cyanosis, no joint deformities ?Skin: Diffuse rash across chest, limbs, no pallor ?Psychiatry: Affect normal //  no auditory hallucinations  ? ?Data Reviewed: ? ?Labs reviewed: K 4.9, WBC 12.5 ? ?Family Communication: Pt in room, family not at bedside ? ?Disposition: ?Status is: Inpatient ?Remains inpatient appropriate because: Severity of illness ? Planned Discharge Destination: Home ? ? ? ? ?Author: ?Marylu Lund, MD ?02/21/2022 6:13 PM ? ?For on call review [email or web address]  ?

## 2022-02-21 NOTE — Progress Notes (Signed)
? ? ? ? ? ? ?Subjective: ? ?Worsening rash ? ? ?Antibiotics:  ?Anti-infectives (From admission, onward)  ? ? Start     Dose/Rate Route Frequency Ordered Stop  ? 02/20/22 0600  vancomycin (VANCOCIN) IVPB 1000 mg/200 mL premix       ? 1,000 mg ?200 mL/hr over 60 Minutes Intravenous Every 24 hours 02/19/22 1552    ? 02/20/22 0200  vancomycin (VANCOCIN) IVPB 1000 mg/200 mL premix  Status:  Discontinued       ? 1,000 mg ?200 mL/hr over 60 Minutes Intravenous  Once 02/19/22 0209 02/19/22 1552  ? 02/19/22 0215  vancomycin (VANCOREADY) IVPB 1500 mg/300 mL       ? 1,500 mg ?150 mL/hr over 120 Minutes Intravenous  Once 02/19/22 0120 02/19/22 0437  ? 02/19/22 0215  ceFEPIme (MAXIPIME) 2 g in sodium chloride 0.9 % 100 mL IVPB  Status:  Discontinued       ? 2 g ?200 mL/hr over 30 Minutes Intravenous Every 8 hours 02/19/22 0128 02/19/22 0823  ? 02/15/22 1500  levofloxacin (LEVAQUIN) IVPB 750 mg  Status:  Discontinued       ? 750 mg ?100 mL/hr over 90 Minutes Intravenous Every 24 hours 02/14/22 1712 02/15/22 1222  ? 02/15/22 1400  ceFAZolin (ANCEF) IVPB 2g/100 mL premix  Status:  Discontinued       ? 2 g ?200 mL/hr over 30 Minutes Intravenous Every 8 hours 02/15/22 1222 02/19/22 0823  ? 02/14/22 1500  levofloxacin (LEVAQUIN) IVPB 750 mg       ? 750 mg ?100 mL/hr over 90 Minutes Intravenous  Once 02/14/22 1453 02/14/22 1606  ? ?  ? ? ?Medications: ?Scheduled Meds: ? apixaban  5 mg Oral BID  ? calcium carbonate  1 tablet Oral BID  ? clopidogrel  75 mg Oral Daily  ? ezetimibe  10 mg Oral QHS  ? famotidine  20 mg Oral BID  ? guaiFENesin  600 mg Oral BID  ? insulin aspart  0-20 Units Subcutaneous TID WC  ? insulin aspart  0-5 Units Subcutaneous QHS  ? methimazole  5 mg Oral Daily  ? midodrine  10 mg Oral TID WC  ? pantoprazole  40 mg Oral QAC breakfast  ? potassium chloride  40 mEq Oral Daily  ? predniSONE  30 mg Oral Q breakfast  ? ?Continuous Infusions: ? vancomycin Stopped (02/21/22 0708)  ? ?PRN Meds:.acetaminophen **OR**  acetaminophen, albuterol, chlorpheniramine-HYDROcodone, diphenhydrAMINE, fentaNYL (SUBLIMAZE) injection, guaiFENesin-dextromethorphan, hydrocerin, melatonin, ondansetron **OR** ondansetron (ZOFRAN) IV ? ? ? ?Objective: ?Weight change:  ? ?Intake/Output Summary (Last 24 hours) at 02/21/2022 1424 ?Last data filed at 02/21/2022 1011 ?Gross per 24 hour  ?Intake 468.49 ml  ?Output 800 ml  ?Net -331.51 ml  ? ? ?Blood pressure (!) 104/54, pulse (!) 102, temperature 97.8 ?F (36.6 ?C), temperature source Oral, resp. rate 14, height 5\' 1"  (1.549 m), weight 86.5 kg, SpO2 92 %. ?Temp:  [97.1 ?F (36.2 ?C)-98.4 ?F (36.9 ?C)] 97.8 ?F (36.6 ?C) (04/11 1215) ?Pulse Rate:  [59-115] 102 (04/11 1100) ?Resp:  [14-28] 14 (04/11 1100) ?BP: (92-141)/(32-80) 104/54 (04/11 1100) ?SpO2:  [92 %-100 %] 92 % (04/11 1100) ? ?Physical Exam: ?Physical Exam ?Constitutional:   ?   Appearance: She is obese.  ?Eyes:  ?   Extraocular Movements: Extraocular movements intact.  ?Cardiovascular:  ?   Rate and Rhythm: Tachycardia present.  ?   Heart sounds: No murmur heard. ?  No friction rub. No gallop.  ?Pulmonary:  ?  Effort: Pulmonary effort is normal. No respiratory distress.  ?   Breath sounds: No stridor. No wheezing.  ?Abdominal:  ?   General: There is no distension.  ?   Palpations: There is no mass.  ?   Tenderness: There is no abdominal tenderness.  ?   Hernia: No hernia is present.  ?Musculoskeletal:     ?   General: Normal range of motion.  ?Skin: ?   Coloration: Skin is pale.  ?   Findings: Erythema and rash present.  ?Neurological:  ?   General: No focal deficit present.  ?   Mental Status: She is alert and oriented to person, place, and time.  ?Psychiatric:     ?   Attention and Perception: Attention normal.     ?   Mood and Affect: Mood is not anxious.     ?   Speech: Speech normal.     ?   Behavior: Behavior normal.     ?   Thought Content: Thought content normal.     ?   Cognition and Memory: Cognition and memory normal.     ?   Judgment:  Judgment normal.  ?  ?Rash 02/20/2022: ? ? ? ? ? ?Right groin wound right shin wound and left ankle wound have been dressed daily and appears stable. ? ? ? ?CBC: ? ? ? ?BMET ?Recent Labs  ?  02/20/22 ?0655 02/21/22 ?0438  ?NA 133* 135  ?K 4.8 4.9  ?CL 102 103  ?CO2 28 28  ?GLUCOSE 276* 179*  ?BUN 7* 11  ?CREATININE 0.47 0.52  ?CALCIUM 7.9* 8.2*  ? ? ? ? ?Liver Panel ? ?Recent Labs  ?  02/19/22 ?0848 02/20/22 ?[id number]  ?PROT 5.7* 5.6*  ?ALBUMIN 1.9* 1.9*  ?AST 10* 8*  ?ALT <5 <5  ?ALKPHOS 71 70  ?BILITOT 0.3 0.5  ? ? ? ? ? ? ?Sedimentation Rate ?No results for input(s): ESRSEDRATE in the last 72 hours. ?C-Reactive Protein ?No results for input(s): CRP in the last 72 hours. ? ?Micro Results: ?Recent Results (from the past 720 hour(s))  ?Resp Panel by RT-PCR (Flu A&B, Covid) Nasopharyngeal Swab     Status: None  ? Collection Time: 02/14/22  1:45 PM  ? Specimen: Nasopharyngeal Swab; Nasopharyngeal(NP) swabs in vial transport medium  ?Result Value Ref Range Status  ? SARS Coronavirus 2 by RT PCR NEGATIVE NEGATIVE Final  ?  Comment: (NOTE) ?SARS-CoV-2 target nucleic acids are NOT DETECTED. ? ?The SARS-CoV-2 RNA is generally detectable in upper respiratory ?specimens during the acute phase of infection. The lowest ?concentration of SARS-CoV-2 viral copies this assay can detect is ?138 copies/mL. A negative result does not preclude SARS-Cov-2 ?infection and should not be used as the sole basis for treatment or ?other patient management decisions. A negative result may occur with  ?improper specimen collection/handling, submission of specimen other ?than nasopharyngeal swab, presence of viral mutation(s) within the ?areas targeted by this assay, and inadequate number of viral ?copies(<138 copies/mL). A negative result must be combined with ?clinical observations, patient history, and epidemiological ?information. The expected result is Negative. ? ?Fact Sheet for Patients:  ?EntrepreneurPulse.com.au ? ?Fact  Sheet for Healthcare Providers:  ?IncredibleEmployment.be ? ?This test is no t yet approved or cleared by the [place] FDA and  ?has been authorized for detection and/or diagnosis of SARS-CoV-2 by ?FDA under an Emergency Use Authorization (EUA). This EUA will remain  ?in effect (meaning this test can be used) for the duration of the ?  COVID-19 declaration under Section 564(b)(1) of the Act, 21 ?U.S.C.section 360bbb-3(b)(1), unless the authorization is terminated  ?or revoked sooner.  ? ? ?  ? Influenza A by PCR NEGATIVE NEGATIVE Final  ? Influenza B by PCR NEGATIVE NEGATIVE Final  ?  Comment: (NOTE) ?The Xpert Xpress SARS-CoV-2/FLU/RSV plus assay is intended as an aid ?in the diagnosis of influenza from Nasopharyngeal swab specimens and ?should not be used as a sole basis for treatment. Nasal washings and ?aspirates are unacceptable for Xpert Xpress SARS-CoV-2/FLU/RSV ?testing. ? ?Fact Sheet for Patients: ?EntrepreneurPulse.com.au ? ?Fact Sheet for Healthcare Providers: ?IncredibleEmployment.be ? ?This test is not yet approved or cleared by the [place] FDA and ?has been authorized for detection and/or diagnosis of SARS-CoV-2 by ?FDA under an Emergency Use Authorization (EUA). This EUA will remain ?in effect (meaning this test can be used) for the duration of the ?COVID-19 declaration under Section 564(b)(1) of the Act, 21 U.S.C. ?section 360bbb-3(b)(1), unless the authorization is terminated or ?revoked. ? ?Performed at [facility], [address] ?[place], [place] [address] ?  ?Culture, blood (Routine X 2) w Reflex to ID Panel     Status: Abnormal  ? Collection Time: 02/14/22  3:03 PM  ? Specimen: BLOOD  ?Result Value Ref Range Status  ? Specimen Description   Final  ?  BLOOD BLOOD LEFT WRIST ?Performed at [facility], [place] [address] [place], [place] [address] ?  ? Special Requests   Final  ?  BOTTLES DRAWN  AEROBIC AND ANAEROBIC Blood Culture adequate volume ?Performed at [facility], [place] [address] [place], [place] [address] ?  ? Culture  Setup Time   Final  ?  GRAM POSITIVE COCCI ?IN BOTH AEROBIC A

## 2022-02-21 NOTE — Progress Notes (Signed)
?  Echocardiogram ?Echocardiogram Transesophageal has been performed. ? ?Kelly Lee ?02/21/2022, 10:25 AM ?

## 2022-02-21 NOTE — Anesthesia Postprocedure Evaluation (Signed)
Anesthesia Post Note ? ?Patient: Kelly Lee ? ?Procedure(s) Performed: TRANSESOPHAGEAL ECHOCARDIOGRAM (TEE) ? ?  ? ?Patient location during evaluation: PACU ?Anesthesia Type: MAC ?Level of consciousness: awake and alert ?Pain management: pain level controlled ?Vital Signs Assessment: post-procedure vital signs reviewed and stable ?Respiratory status: spontaneous breathing ?Cardiovascular status: stable ?Anesthetic complications: no ? ? ?No notable events documented. ? ?Last Vitals:  ?Vitals:  ? 02/21/22 1048 02/21/22 1100  ?BP: 92/71 (!) 104/54  ?Pulse: (!) 110 (!) 102  ?Resp: (!) 28 14  ?Temp:    ?SpO2: 96% 92%  ?  ?Last Pain:  ?Vitals:  ? 02/21/22 1100  ?TempSrc:   ?PainSc: 0-No pain  ? ? ?  ?  ?  ?  ?  ?  ? ?Nolon Nations ? ? ? ? ?

## 2022-02-21 NOTE — Op Note (Signed)
INDICATIONS: MSSA bacteremia  PROCEDURE:   Informed consent was obtained prior to the procedure. The risks, benefits and alternatives for the procedure were discussed and the patient comprehended these risks.  Risks include, but are not limited to, cough, sore throat, vomiting, nausea, somnolence, esophageal and stomach trauma or perforation, bleeding, low blood pressure, aspiration, pneumonia, infection, trauma to the teeth and death.    After a procedural time-out, the oropharynx was anesthetized with 20% benzocaine spray.   During this procedure the patient was administered IV propofol by Anesthesiology, Dr. Lissa Hoard.  The transesophageal probe was inserted in the esophagus and stomach without difficulty and multiple views were obtained.  The patient was kept under observation until the patient left the procedure room.  The patient left the procedure room in stable condition.   Agitated microbubble saline contrast was not administered.  COMPLICATIONS:    There were no immediate complications.  FINDINGS:  No vegetations or other signs of endocarditis. The atria appear dilated, but otherwise normal TEE.  RECOMMENDATIONS:     Alternative source of bacteremia should be evaluated.  Time Spent Directly with the Patient:  30 minutes   Kelly Lee 02/21/2022, 10:06 AM

## 2022-02-21 NOTE — Anesthesia Procedure Notes (Signed)
Procedure Name: [place] ?Date/Time: 02/21/2022 9:53 AM ?Performed by: Dorann Lodge, CRNA ?Pre-anesthesia Checklist: Patient identified, Emergency Drugs available, Suction available and Patient being monitored ?Patient Re-evaluated:Patient Re-evaluated prior to induction ?Oxygen Delivery Method: Nasal cannula ?Induction Type: IV induction ?Airway Equipment and Method: Bite block ? ? ? ? ?

## 2022-02-21 NOTE — Anesthesia Preprocedure Evaluation (Addendum)
Anesthesia Evaluation  ?Patient identified by MRN, date of birth, ID band ?Patient awake ? ? ? ?Reviewed: ?Allergy & Precautions, NPO status , Patient's Chart, lab work & pertinent test results ? ?History of Anesthesia Complications ?Negative for: history of anesthetic complications ? ?Airway ?Mallampati: II ? ?TM Distance: >3 FB ?Neck ROM: Full ? ? ? Dental ? ?(+) Dental Advisory Given ?  ?Pulmonary ?asthma , pneumonia,  ?2L O2 [place] ?  ?Pulmonary exam normal ?breath sounds clear to auscultation ? ? ? ? ? ? Cardiovascular ?hypertension, Pt. on home beta blockers and Pt. on medications ?(-) angina+ CAD, + Past MI, + Cardiac Stents, + CABG (2000) and + Peripheral Vascular Disease  ?Normal cardiovascular exam+ dysrhythmias Atrial Fibrillation  ?Rhythm:Regular Rate:Normal ? ?Echo 02/2022 ??1. Left ventricular ejection fraction, by estimation, is 55 to 60%. The left ventricle has normal function. Left ventricular endocardial border not optimally defined to evaluate regional wall motion. There is the interventricular septum is flattened in systole and diastole, consistent with right ventricular pressure and volume overload.  ??2. RV remains moderately dilated but function has improved, mildly reduced. Right ventricular systolic function is mildly reduced. The right ventricular size is moderately enlarged. There is normal pulmonary artery systolic pressure. The estimated right ?ventricular systolic pressure is 03.4 mmHg.  ??3. The mitral valve is degenerative. Moderate mitral annular calcification.  ??4. 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 The aortic valve is tricuspid. There is mild calcification of the aortic valve. There is mild thickening of the aortic valve. Aortic valve regurgitation is not visualized. No aortic stenosis is present.  ??5. The inferior vena cava is normal in size with greater than 50% respiratory  variability, suggesting right atrial pressure of 3 mmHg.  ?  ?Neuro/Psych ?negative neurological ROS ? negative psych ROS  ? GI/Hepatic ?Neg liver ROS, GERD  Medicated,  ?Endo/Other  ?diabetes, Type 2, Oral Hypoglycemic Agents, Insulin DependentHypothyroidism Obesity ? ? Renal/GU ?negative Renal ROS  ? ?  ?Musculoskeletal ?negative musculoskeletal ROS ?(+)  ? Abdominal ?(+) + obese,   ?Peds ? Hematology ? ?(+) Blood dyscrasia (Eliquis), anemia ,   ?Anesthesia Other Findings ?Day of surgery medications reviewed with the patient. ? Reproductive/Obstetrics ? ?  ? ? ? ? ? ? ? ? ? ? ? ? ? ?  ?  ? ? ? ? ? ? ? ?Anesthesia Physical ? ?Anesthesia Plan ? ?ASA: 3 ? ?Anesthesia Plan: MAC  ? ?Post-op Pain Management: Minimal or no pain anticipated  ? ?Induction: Intravenous ? ?PONV Risk Score and Plan: 3 and Treatment may vary due to age or medical condition, Propofol infusion and TIVA ? ?Airway Management Planned: Simple Face Mask ? ?Additional Equipment:  ? ?Intra-op Plan:  ? ?Post-operative Plan:  ? ?Informed Consent: I have reviewed the patients History and Physical, chart, labs and discussed the procedure including the risks, benefits and alternatives for the proposed anesthesia with the patient or authorized representative who has indicated his/her understanding and acceptance.  ? ? ? ?Dental advisory given ? ?Plan Discussed with: CRNA ? ?Anesthesia Plan Comments:   ? ? ? ? ?Anesthesia Quick Evaluation ? ?

## 2022-02-21 NOTE — Transfer of Care (Signed)
Immediate Anesthesia Transfer of Care Note ? ?Patient: Kelly Lee ? ?Procedure(s) Performed: TRANSESOPHAGEAL ECHOCARDIOGRAM (TEE) ? ?Patient Location: Endoscopy Unit ? ?Anesthesia Type:MAC ? ?Level of Consciousness: awake and drowsy ? ?Airway & Oxygen Therapy: Patient Spontanous Breathing and Patient connected to nasal cannula oxygen ? ?Post-op Assessment: Report given to RN and Post -op Vital signs reviewed and stable ? ?Post vital signs: Reviewed and stable ? ?Last Vitals:  ?Vitals Value Taken Time  ?BP 103/45 02/21/22 1010  ?Temp 36.9 ?C 02/21/22 1008  ?Pulse 98 02/21/22 1011  ?Resp 16 02/21/22 1011  ?SpO2 94 % 02/21/22 1011  ?Vitals shown include unvalidated device data. ? ?Last Pain:  ?Vitals:  ? 02/21/22 1008  ?TempSrc: Temporal  ?PainSc:   ?   ? ?Patients Stated Pain Goal: 0 (02/15/22 0448) ? ?Complications: No notable events documented. ?

## 2022-02-22 ENCOUNTER — Encounter (HOSPITAL_COMMUNITY): Payer: Self-pay | Admitting: Cardiovascular Disease

## 2022-02-22 DIAGNOSIS — E119 Type 2 diabetes mellitus without complications: Secondary | ICD-10-CM | POA: Diagnosis not present

## 2022-02-22 DIAGNOSIS — J189 Pneumonia, unspecified organism: Secondary | ICD-10-CM | POA: Diagnosis not present

## 2022-02-22 DIAGNOSIS — E785 Hyperlipidemia, unspecified: Secondary | ICD-10-CM

## 2022-02-22 DIAGNOSIS — T50905A Adverse effect of unspecified drugs, medicaments and biological substances, initial encounter: Secondary | ICD-10-CM

## 2022-02-22 DIAGNOSIS — E876 Hypokalemia: Secondary | ICD-10-CM

## 2022-02-22 DIAGNOSIS — Z86718 Personal history of other venous thrombosis and embolism: Secondary | ICD-10-CM

## 2022-02-22 DIAGNOSIS — I480 Paroxysmal atrial fibrillation: Secondary | ICD-10-CM

## 2022-02-22 DIAGNOSIS — E0590 Thyrotoxicosis, unspecified without thyrotoxic crisis or storm: Secondary | ICD-10-CM

## 2022-02-22 DIAGNOSIS — R7881 Bacteremia: Secondary | ICD-10-CM | POA: Diagnosis not present

## 2022-02-22 LAB — GLUCOSE, CAPILLARY
Glucose-Capillary: 135 mg/dL — ABNORMAL HIGH (ref 70–99)
Glucose-Capillary: 205 mg/dL — ABNORMAL HIGH (ref 70–99)
Glucose-Capillary: 242 mg/dL — ABNORMAL HIGH (ref 70–99)
Glucose-Capillary: 363 mg/dL — ABNORMAL HIGH (ref 70–99)
Glucose-Capillary: 239 mg/dL — ABNORMAL HIGH (ref 70–99)

## 2022-02-22 LAB — COMPREHENSIVE METABOLIC PANEL
ALT: 6 U/L (ref 0–44)
AST: 10 U/L — ABNORMAL LOW (ref 15–41)
Albumin: 2.2 g/dL — ABNORMAL LOW (ref 3.5–5.0)
Alkaline Phosphatase: 60 U/L (ref 38–126)
Anion gap: 3 — ABNORMAL LOW (ref 5–15)
BUN: 10 mg/dL (ref 8–23)
CO2: 32 mmol/L (ref 22–32)
Calcium: 8.3 mg/dL — ABNORMAL LOW (ref 8.9–10.3)
Chloride: 99 mmol/L (ref 98–111)
Creatinine, Ser: 0.41 mg/dL — ABNORMAL LOW (ref 0.44–1.00)
GFR, Estimated: >60 mL/min (ref >60)
Glucose, Bld: 217 mg/dL — ABNORMAL HIGH (ref 70–99)
Potassium: 4.7 mmol/L (ref 3.5–5.1)
Sodium: 134 mmol/L — ABNORMAL LOW (ref 135–145)
Total Bilirubin: 0.5 mg/dL (ref 0.3–1.2)
Total Protein: 6.1 g/dL — ABNORMAL LOW (ref 6.5–8.1)

## 2022-02-22 LAB — CBC
HCT: 27.6 % — ABNORMAL LOW (ref 36.0–46.0)
Hemoglobin: 8.5 g/dL — ABNORMAL LOW (ref 12.0–15.0)
MCH: 27.6 pg (ref 26.0–34.0)
MCHC: 30.8 g/dL (ref 30.0–36.0)
MCV: 89.6 fL (ref 80.0–100.0)
Platelets: 382 10*3/uL (ref 150–400)
RBC: 3.08 MIL/uL — ABNORMAL LOW (ref 3.87–5.11)
RDW: 16 % — ABNORMAL HIGH (ref 11.5–15.5)
WBC: 8.2 10*3/uL (ref 4.0–10.5)
nRBC: 0 % (ref 0.0–0.2)

## 2022-02-22 LAB — MAGNESIUM: Magnesium: 1.9 mg/dL (ref 1.7–2.4)

## 2022-02-22 MED ORDER — INSULIN GLARGINE-YFGN 100 UNIT/ML ~~LOC~~ SOLN
20.0000 [IU] | Freq: Every day | SUBCUTANEOUS | Status: DC
  Administered 2022-02-22 – 2022-02-27 (×6): 20 [IU] via SUBCUTANEOUS
  Filled 2022-02-22 (×8): qty 0.2

## 2022-02-22 NOTE — Evaluation (Signed)
Physical Therapy Evaluation ?Patient Details ?Name: Kelly Lee ?MRN: [id number] ?DOB: 22-Jun-1951 ?Today's Date: 02/22/2022 ? ?History of Present Illness ? 71 y.o. female with medical history significant of HTN, DM2. Presenting with weakness. Patient found to have pna, drainage from pannus wound.  ?Clinical Impression ? The  patient reports that she has been  mobile using RW at home, has been receiving HHPT. Patient ambulated x 20' using RW. HR noted to be 96 to 124 during mobility. Pt admitted with above diagnosis.  Pt currently with functional limitations due to the deficits listed below (see PT Problem List). Pt will benefit from skilled PT to increase their independence and safety with mobility to allow discharge to the venue listed below.   ? ?   ? ?Recommendations for follow up therapy are one component of a multi-disciplinary discharge planning process, led by the attending physician.  Recommendations may be updated based on patient status, additional functional criteria and insurance authorization. ? ?Follow Up Recommendations Home health PT (has  been having it PTA) ? ?  ?Assistance Recommended at Discharge Intermittent Supervision/Assistance  ?Patient can return home with the following ? Help with stairs or ramp for entrance;Assistance with cooking/housework;Assist for transportation;A little help with walking and/or transfers ? ?  ?Equipment Recommendations None recommended by PT  ?Recommendations for Other Services ?    ?  ?Functional Status Assessment Patient has had a recent decline in their functional status and demonstrates the ability to make significant improvements in function in a reasonable and predictable amount of time.  ? ?  ?Precautions / Restrictions Precautions ?Precautions: None ?Restrictions ?Weight Bearing Restrictions: No  ? ?  ? ?Mobility ? Bed Mobility ?  ?  ?  ?  ?  ?  ?  ?  ?  ? ?Transfers ?  ?  ?  ?  ?  ?  ?  ?  ?  ?General transfer comment: Use of RW to ambulate in room with min  guard to supervision. No overt loss of balance. ?  ? ?Ambulation/Gait ?Ambulation/Gait assistance: Min guard ?Gait Distance (Feet): 20 Feet ?Assistive device: Rolling walker (2 wheels) ?Gait Pattern/deviations: Step-through pattern ?  ?  ?  ?General Gait Details: managed around room without any issues ? ?Stairs ?  ?  ?  ?  ?  ? ?Wheelchair Mobility ?  ? ?Modified Rankin (Stroke Patients Only) ?  ? ?  ? ?Balance Overall balance assessment: Mild deficits observed, not formally tested ?  ?  ?  ?  ?  ?  ?  ?  ?  ?  ?  ?  ?  ?  ?  ?  ?  ?  ?   ? ? ? ?Pertinent Vitals/Pain Pain Assessment ?Pain Assessment: No/denies pain  ? ? ?Home Living Family/patient expects to be discharged to:: Private residence ?Living Arrangements: Children ?Available Help at Discharge: Family;Available PRN/intermittently ?Type of Home: House ?Home Access: Ramped entrance ?  ?  ?  ?  ?Home Equipment: Wheelchair - Nurse, children's (2 wheels);Hospital bed ?Additional Comments: Reports hospital bed is broken, ca't raise it.  ?  ?Prior Function Prior Level of Function : Needs assist ?  ?  ?  ?  ?  ?  ?Mobility Comments: ambulates with RW, can go to car, scooter in grocery store ?ADLs Comments: Reports Mod I from wc level or with RW. sponge bath. reports walking more and using RW since last admission, able to walk to the car and into the store. ?  ? ? ?  Hand Dominance  ? Dominant Hand: Right ? ?  ?Extremity/Trunk Assessment  ? Upper Extremity Assessment ?Upper Extremity Assessment: Defer to OT evaluation ?RUE Deficits / Details: WFL ROM, shoulder ROM to approx 90 degrees, grossly 5/5 strength ?RUE Sensation: WNL ?RUE Coordination: WNL ?LUE Deficits / Details: WFL ROM, 3+/5 shoulder strength (reports chronic - appears to be rotator cuff related), otherwise 5/5 strength ?LUE Sensation: WNL ?LUE Coordination: WNL ?  ? ?Lower Extremity Assessment ?Lower Extremity Assessment: Generalized weakness;Overall [facility] for tasks assessed ?  ? ?Cervical /  Trunk Assessment ?Cervical / Trunk Assessment: Kyphotic  ?Communication  ? Communication: No difficulties  ?Cognition   ?  ?  ?  ?  ?  ?  ?  ?  ?  ?  ?  ?  ?  ?  ?  ?  ?  ?  ?  ?  ?  ? ?  ?General Comments   ? ?  ?Exercises    ? ?Assessment/Plan  ?  ?PT Assessment Patient needs continued PT services  ?PT Problem List Decreased strength;Decreased activity tolerance;Cardiopulmonary status limiting activity ? ?   ?  ?PT Treatment Interventions DME instruction;Therapeutic activities;Gait training;Therapeutic exercise;Patient/family education;Functional mobility training   ? ?PT Goals (Current goals can be found in the Care Plan section)  ?Acute Rehab PT Goals ?Patient Stated Goal: go home ?PT Goal Formulation: With patient ?Time For Goal Achievement: 03/08/22 ?Potential to Achieve Goals: Good ? ?  ?Frequency Min 3X/week ?  ? ? ?Co-evaluation   ?  ?  ?  ?  ? ? ?  ?AM-PAC PT "6 Clicks" Mobility  ?Outcome Measure Help needed turning from your back to your side while in a flat bed without using bedrails?: A Little ?Help needed moving from lying on your back to sitting on the side of a flat bed without using bedrails?: A Little ?Help needed moving to and from a bed to a chair (including a wheelchair)?: A Little ?Help needed standing up from a chair using your arms (e.g., wheelchair or bedside chair)?: A Little ?Help needed to walk in hospital room?: A Little ?Help needed climbing 3-5 steps with a railing? : A Lot ?6 Click Score: 17 ? ?  ?End of Session Equipment Utilized During Treatment: Gait belt ?Activity Tolerance: Patient tolerated treatment well ?Patient left: in chair;with call bell/phone within reach;with chair alarm set ?Nurse Communication: Mobility status ?PT Visit Diagnosis: Unsteadiness on feet (R26.81) ?  ? ?Time: 1208-1221 ?PT Time Calculation (min) (ACUTE ONLY): 13 min ? ? ?Charges:   PT Evaluation ?$PT Eval Low Complexity: 1 Low ?  ?  ?   ? ? ?Blanchard Kelch PT ?Acute Rehabilitation Services ?Pager  [phone] ?Office [phone] ? ? ?Latham Kinzler, Jobe Igo ?02/22/2022, 12:57 PM ? ?

## 2022-02-22 NOTE — Progress Notes (Signed)
Pharmacy Antibiotic Note ? ?Kelly Lee is a 71 y.o. female admitted on 02/14/2022 with MSSA bacteremia, [place], Leg/pannus wound.  She developed a severe rash and hypotension on Cefazolin which has been stopped.  Pharmacy is consulted to dose vancomcyin. ? ?Plan: ?Vancomycin 1000mg  q24h (AUC 523, used Scr 0.8) ?Checking Vanc levels tomorrow.  ?Follow renal function, cultures and clinical course ? ?Height: 5\' 1"  (154.9 cm) ?Weight: 86.5 kg (190 lb 11.2 oz) ?IBW/kg (Calculated) : 47.8 ? ?Temp (24hrs), Avg:98 ?F (36.7 ?C), Min:97.8 ?F (36.6 ?C), Max:98.4 ?F (36.9 ?C) ? ?Recent Labs  ?Lab 02/17/22 ?2042 02/18/22 ?0600 02/18/22 ?1903 02/18/22 ?2349 02/19/22 ?0848 02/20/22 ?0028 02/20/22 ?0654 02/20/22 ?04/22/22 02/20/22 ?1328 02/21/22 ?04/22/22 02/22/22 ?0600  ?WBC  --    < >  --  13.9* 20.9* 18.2*  --   --  13.6* 12.5* 8.2  ?CREATININE  --    < >  --  0.57 0.58  --   --  0.47  --  0.52 0.41*  ?LATICACIDVEN 2.0*  --  3.0* 1.5 1.4  --  1.0  --   --   --   --   ? < > = values in this interval not displayed.  ? ?  ?Estimated Creatinine Clearance: 63.5 mL/min (A) (by C-G formula based on SCr of 0.41 mg/dL (L)).   ? ?Allergies  ?Allergen Reactions  ? Penicillins Hives and Shortness Of Breath  ? Cefazolin In Sodium Chloride Rash  ?  Previously tolerated cefazolin in 2022 but developed full body rash in April 2023 after several days of cefazolin   ? Azithromycin Hives  ? Cephalexin Hives  ? Silvadene [Silver Sulfadiazine] Other (See Comments)  ?  "Burns" the skin  ? Chlorhexidine Rash  ? Wound Dressing Adhesive Rash  ? ? ?Antimicrobials this admission:  ?Levaquin 4/4 >> 4/5 ?Ancef 4/5 >> 4/9 ?Vanc 4/9 >> ?Cefepime 4/9 >>4/11 ?  ?Microbiology results:  ?4/4 BCx: now 2/3 MSSA, BCID w/ Staph aureus no resistance ?4/4 COVID/flu: negative ?4/5 Legionella: ?4/5 Strep pneumo: negative ?4/5 MRSA PCR: pos ?4/6 Bcx: NGTD ?4/8 MRSA PCR : ? ?Thank you for allowing pharmacy to be a part of this patient?s care. ? ?6/6 PharmD,  BCPS ?Clinical Pharmacist ?6/8 main pharmacy [phone] ?02/22/2022 10:41 AM ? ? ? ?

## 2022-02-22 NOTE — Progress Notes (Addendum)
? ?PROGRESS NOTE ? ? ? ?Kelly Lee  [id number] DOB: 02/16/1951 DOA: 02/14/2022 ?PCP: Theodosia Blender, PA-C ? ? ?Brief Narrative: ?71 y.o. female with medical history significant of HTN, DM2. Presenting with weakness. She reports that she has felt weak for at least the last 4 days. She has had poor PO intake during that time. She has had nausea, but no vomiting. She reports productive cough, but no fever. She tried APAP, advil and allergy meds to help; however, they didn't work. In addition, she has notice some draining from her pannus wound. When she didn't feel better today, she called EMS to take her to the ED for evaluation. ? ? ?Assessment and Plan: ? ?Right upper lobe pneumonia ?Noted on initial imaging.  Patient initially started on Levaquin was transitioned to antibiotics to cover MSSA bacteremia as mentioned below.  Patient has completed antibiotic course for pneumonia. ? ?Sepsis secondary to MSSA ?In setting of MSSA bacteremia and right upper lobe pneumonia in addition to chronic pannus wound.  Patient empirically treated with antibiotics, vancomycin and cefepime.  Patient transition to cefazolin but developed a rash.  She was transitioned back to vancomycin for treatment of MSSA bacteremia. ?-Continue vancomycin with goal of 14 days total antibiotics (day #1 of treatment on April 6) ? ?MSSA bacteremia ?1/2 sets of blood cultures (4/4) significant for MSSA bacteremia.  Repeat blood cultures (4/6) with no growth.  TTE and TEE without evidence of vegetations.  ID consulted as mentioned above. ?For antibiotics see problem, sepsis ? ?Chronic panus wound ?Concern for possible source of above.  Wound care consulted. ?-Continue wound care ? ?Rash ?Macular rash.  Unsure of etiology but concern for possible DRESS.  No eosinophilia on CBC with differential.  Possibly antibiotic allergic reaction as she does have a history of hives reaction to penicillin and cephalexin.  Patient started on prednisone with some  improvement of her rash.  Currently not hypotensive.  Currently no other symptoms concerning for anaphylaxis. ? ?Hypotension/Septic shock ?Uncertain etiology but in relation to rash vs infection.  Patient required Levophed for just over 24 hours.  Successfully discontinued.  Random cortisol 4.8 not diagnostic of adrenal insufficiency. Patient also started on midodrine.  Hypotension is significantly improved now. ?-Discontinue midodrine ?-Watch blood pressure while in stepdown unit ? ?Hypokalemia ?Patient given repletion.  Now resolved. ? ?Hypomagnesemia ?Patient given repletion.  Now resolved. ? ?Paroxysmal atrial fibrillation ?Currently in sinus rhythm.  Patient on metoprolol and Eliquis as an outpatient.  Metoprolol is held secondary to hypotension requiring vasopressor support. ?-Continue Eliquis ?-Continue to hold metoprolol until blood pressure is better controlled ? ?Diabetes mellitus, type 2 ?Patient is on metformin, Jardiance, Lantus as an outpatient.  As hemoglobin A1c of 9.7% on February 14, 2022.  Blood sugars uncontrolled while inpatient.  Fasting blood sugar of 205 today which is likely worsened secondary to initiation of prednisone.  Semglee was previously discontinued secondary to hypoglycemia. ?-Continue SSI ?-Restart Semglee at 20 units nightly ? ?Primary hypertension ?Patient with hypotension ? ?History of DVT ?-Continue Eliquis ? ?CAD ?Hyperlipidemia ?Patient is on Pravastatin and Zetia as an outpatient ?-Continue Zetia and Pravastatin ? ?Hyperthyroidism ?-Continue methimazole ? ? ?DVT prophylaxis: Eliquis ?Code Status:   Code Status: Full Code ?Family Communication: None at bedside ?Disposition Plan: Discharge home vs SNF pending stable blood pressure, continued improvement of rash, PT/OT recommendations, outpatient antibiotic regimen. Anticipate medical readiness for discharge in 3-5 days ? ? ?Consultants:  ?Infectious disease ?PCCM ? ?Procedures:   ? ? ?Antimicrobials: ?Vancomycin ?  Cefazolin ?Cefepime ? ? ?Subjective: ?Patient reports no issues overnight. ? ?Objective: ?BP 140/62 (BP Location: Right Arm)   Pulse (!) 101   Temp 97.8 ?F (36.6 ?C) (Oral)   Resp 16   Ht 5\' 1"  (1.549 m)   Wt 86.5 kg   SpO2 99%   BMI 36.03 kg/m?  ? ?Examination: ? ?General exam: Appears calm and comfortable ?Respiratory system: Clear to auscultation. Respiratory effort normal. ?Cardiovascular system: S1 & S2 heard, tachycardia with normal rhythm. No murmurs, rubs, gallops or clicks. ?Gastrointestinal system: Abdomen is nondistended, soft and nontender. No organomegaly or masses felt. Normal bowel sounds heard. ?Central nervous system: Alert and oriented. No focal neurological deficits. ?Musculoskeletal: No edema. No calf tenderness ?Skin: No cyanosis. Diffuse macular rash on bilateral arms/legs, upper chest.  ?Psychiatry: Judgement and insight appear normal. Mood & affect appropriate.  ? ? ?Data Reviewed: I have personally reviewed following labs and imaging studies ? ?CBC ?Lab Results  ?Component Value Date  ? WBC 8.2 02/22/2022  ? RBC 3.08 (L) 02/22/2022  ? HGB 8.5 (L) 02/22/2022  ? HCT 27.6 (L) 02/22/2022  ? MCV 89.6 02/22/2022  ? MCH 27.6 02/22/2022  ? PLT 382 02/22/2022  ? MCHC 30.8 02/22/2022  ? RDW 16.0 (H) 02/22/2022  ? LYMPHSABS 1.2 02/20/2022  ? MONOABS 0.7 02/20/2022  ? EOSABS 0.0 02/20/2022  ? BASOSABS 0.0 02/20/2022  ? ? ? ?Last metabolic panel ?Lab Results  ?Component Value Date  ? NA 134 (L) 02/22/2022  ? K 4.7 02/22/2022  ? CL 99 02/22/2022  ? CO2 32 02/22/2022  ? BUN 10 02/22/2022  ? CREATININE 0.41 (L) 02/22/2022  ? GLUCOSE 217 (H) 02/22/2022  ? GFRNONAA >60 02/22/2022  ? GFRAA >60 12/17/2019  ? CALCIUM 8.3 (L) 02/22/2022  ? PHOS 2.4 (L) 02/16/2022  ? PROT 6.1 (L) 02/22/2022  ? ALBUMIN 2.2 (L) 02/22/2022  ? BILITOT 0.5 02/22/2022  ? ALKPHOS 60 02/22/2022  ? AST 10 (L) 02/22/2022  ? ALT 6 02/22/2022  ? ANIONGAP 3 (L) 02/22/2022  ? ? ?GFR: ?Estimated  Creatinine Clearance: 63.5 mL/min (A) (by C-G formula based on SCr of 0.41 mg/dL (L)). ? ?Recent Results (from the past 240 hour(s))  ?Resp Panel by RT-PCR (Flu A&B, Covid) Nasopharyngeal Swab     Status: None  ? Collection Time: 02/14/22  1:45 PM  ? Specimen: Nasopharyngeal Swab; Nasopharyngeal(NP) swabs in vial transport medium  ?Result Value Ref Range Status  ? SARS Coronavirus 2 by RT PCR NEGATIVE NEGATIVE Final  ?  Comment: (NOTE) ?SARS-CoV-2 target nucleic acids are NOT DETECTED. ? ?The SARS-CoV-2 RNA is generally detectable in upper respiratory ?specimens during the acute phase of infection. The lowest ?concentration of SARS-CoV-2 viral copies this assay can detect is ?138 copies/mL. A negative result does not preclude SARS-Cov-2 ?infection and should not be used as the sole basis for treatment or ?other patient management decisions. A negative result may occur with  ?improper specimen collection/handling, submission of specimen other ?than nasopharyngeal swab, presence of viral mutation(s) within the ?areas targeted by this assay, and inadequate number of viral ?copies(<138 copies/mL). A negative result must be combined with ?clinical observations, patient history, and epidemiological ?information. The expected result is Negative. ? ?Fact Sheet for Patients:  ?EntrepreneurPulse.com.au ? ?Fact Sheet for Healthcare Providers:  ?IncredibleEmployment.be ? ?This test is no t yet approved or cleared by the [place] FDA and  ?has been authorized for detection and/or diagnosis of SARS-CoV-2 by ?FDA under an Emergency Use Authorization (EUA). This  EUA will remain  ?in effect (meaning this test can be used) for the duration of the ?COVID-19 declaration under Section 564(b)(1) of the Act, 21 ?U.S.C.section 360bbb-3(b)(1), unless the authorization is terminated  ?or revoked sooner.  ? ? ?  ? Influenza A by PCR NEGATIVE NEGATIVE Final  ? Influenza B by PCR NEGATIVE NEGATIVE Final   ?  Comment: (NOTE) ?The Xpert Xpress SARS-CoV-2/FLU/RSV plus assay is intended as an aid ?in the diagnosis of influenza from Nasopharyngeal swab specimens and ?should not be used as a sole basis for treatment. Nasal washings and ?aspirates are unacceptable for Xpert Xpre

## 2022-02-22 NOTE — Evaluation (Signed)
Occupational Therapy Evaluation ?Patient Details ?Name: Kelly Lee ?MRN: [id number] ?DOB: 1951/05/31 ?Today's Date: 02/22/2022 ? ? ?History of Present Illness 71 y.o. female with medical history significant of HTN, DM2. Presenting with weakness. Patient found to have pna.  ? ?Clinical Impression ?  ?Kelly Lee is a 71 year old woman admitted to hospital with pneumonia and complaints of weakness. Today on evaluation patient exhibits functional strength and ROM of upper extremities, ability to perform functional mobility and ADLs. Patient has needed DME at home and assistance of family PRN. Patient has no OT needs at this time.  ?   ? ?Recommendations for follow up therapy are one component of a multi-disciplinary discharge planning process, led by the attending physician.  Recommendations may be updated based on patient status, additional functional criteria and insurance authorization.  ? ?Follow Up Recommendations ? No OT follow up  ?  ?Assistance Recommended at Discharge PRN  ?Patient can return home with the following Assistance with cooking/housework;Help with stairs or ramp for entrance ? ?  ?Functional Status Assessment ? Patient has not had a recent decline in their functional status  ?Equipment Recommendations ? None recommended by OT  ?  ?Recommendations for Other Services   ? ? ?  ?Precautions / Restrictions Precautions ?Precautions: None ?Restrictions ?Weight Bearing Restrictions: No  ? ?  ? ?Mobility Bed Mobility ?Overal bed mobility: Modified Independent ?  ?  ?  ?  ?  ?  ?General bed mobility comments: use of bed rails ?  ? ?Transfers ?Overall transfer level: Modified independent ?Equipment used: Rolling walker (2 wheels) ?  ?  ?  ?  ?  ?  ?  ?General transfer comment: Use of RW to ambulate in room with min guard to supervision. No overt loss of balance. ?  ? ?  ?Balance Overall balance assessment: Mild deficits observed, not formally tested ?  ?  ?  ?  ?  ?  ?  ?  ?  ?  ?  ?  ?  ?  ?  ?   ?  ?  ?   ? ?ADL either performed or assessed with clinical judgement  ? ?ADL Overall ADL's : At baseline ?  ?  ?  ?  ?  ?  ?  ?  ?  ?  ?  ?  ?  ?  ?  ?  ?  ?  ?  ?   ? ? ? ?Vision Patient Visual Report: No change from baseline ?   ?   ?Perception   ?  ?Praxis   ?  ? ?Pertinent Vitals/Pain Pain Assessment ?Pain Assessment: No/denies pain  ? ? ? ?Hand Dominance Right ?  ?Extremity/Trunk Assessment Upper Extremity Assessment ?Upper Extremity Assessment: RUE deficits/detail;LUE deficits/detail ?RUE Deficits / Details: WFL ROM, shoulder ROM to approx 90 degrees, grossly 5/5 strength ?RUE Sensation: WNL ?RUE Coordination: WNL ?LUE Deficits / Details: WFL ROM, 3+/5 shoulder strength (reports chronic - appears to be rotator cuff related), otherwise 5/5 strength ?LUE Sensation: WNL ?LUE Coordination: WNL ?  ?Lower Extremity Assessment ?Lower Extremity Assessment: Defer to PT evaluation ?  ?Cervical / Trunk Assessment ?Cervical / Trunk Assessment: Normal ?  ?Communication Communication ?Communication: No difficulties ?  ?Cognition Arousal/Alertness: Awake/alert ?Behavior During Therapy: [facility] for tasks assessed/performed ?Overall Cognitive Status: Within Functional Limits for tasks assessed ?  ?  ?  ?  ?  ?  ?  ?  ?  ?  ?  ?  ?  ?  ?  ?  ?  ?  ?  ?  General Comments    ? ?  ?Exercises   ?  ?Shoulder Instructions    ? ? ?Home Living Family/patient expects to be discharged to:: Private residence ?Living Arrangements: Children ?Available Help at Discharge: Family;Available PRN/intermittently ?Type of Home: House ?Home Access: Ramped entrance ?  ?  ?  ?  ?  ?  ?  ?  ?  ?  ?Home Equipment: Wheelchair - Nurse, children's (2 wheels);Hospital bed ?  ?Additional Comments: Reports hospital bed is broken, ca't raise it. ?  ? ?  ?Prior Functioning/Environment Prior Level of Function : Needs assist ?  ?  ?  ?  ?  ?  ?Mobility Comments: ambulates with RW, can go to car, scooter in grocery store ?ADLs Comments: Reports Mod I from  wc level or with RW. sponge bath. reports walking more and using RW since last admission, able to walk to the car and into the store. ?  ? ?  ?  ?OT Problem List:   ?  ?   ?OT Treatment/Interventions:    ?  ?OT Goals(Current goals can be found in the care plan section) Acute Rehab OT Goals ?OT Goal Formulation: All assessment and education complete, DC therapy  ?OT Frequency:   ?  ? ?Co-evaluation   ?  ?  ?  ?  ? ?  ?AM-PAC OT "6 Clicks" Daily Activity     ?Outcome Measure Help from another person eating meals?: None ?Help from another person taking care of personal grooming?: None ?Help from another person toileting, which includes using toliet, bedpan, or urinal?: None ?Help from another person bathing (including washing, rinsing, drying)?: None ?Help from another person to put on and taking off regular upper body clothing?: None ?Help from another person to put on and taking off regular lower body clothing?: None ?6 Click Score: 24 ?  ?End of Session Equipment Utilized During Treatment: Rolling walker (2 wheels) ?Nurse Communication:  (okay to see) ? ?Activity Tolerance: Patient tolerated treatment well ?Patient left: in chair;with call bell/phone within reach ? ?OT Visit Diagnosis: Muscle weakness (generalized) (M62.81)  ?              ?Time: 1208-1221 ?OT Time Calculation (min): 13 min ?Charges:  OT General Charges ?$OT Visit: 1 Visit ?OT Evaluation ?$OT Eval Low Complexity: 1 Low ? ?[name], OTR/L ?Acute Care Rehab Services  ?Office [phone] ?Pager: [phone]  ? ?[name] ?02/22/2022, 12:32 PM ?

## 2022-02-23 DIAGNOSIS — E119 Type 2 diabetes mellitus without complications: Secondary | ICD-10-CM | POA: Diagnosis not present

## 2022-02-23 DIAGNOSIS — R7881 Bacteremia: Secondary | ICD-10-CM | POA: Diagnosis not present

## 2022-02-23 DIAGNOSIS — J189 Pneumonia, unspecified organism: Secondary | ICD-10-CM | POA: Diagnosis not present

## 2022-02-23 DIAGNOSIS — T50905A Adverse effect of unspecified drugs, medicaments and biological substances, initial encounter: Secondary | ICD-10-CM | POA: Diagnosis not present

## 2022-02-23 LAB — VANCOMYCIN, PEAK: Vancomycin Pk: 23 ug/mL — ABNORMAL LOW (ref 30–40)

## 2022-02-23 LAB — GLUCOSE, CAPILLARY
Glucose-Capillary: 189 mg/dL — ABNORMAL HIGH (ref 70–99)
Glucose-Capillary: 228 mg/dL — ABNORMAL HIGH (ref 70–99)
Glucose-Capillary: 365 mg/dL — ABNORMAL HIGH (ref 70–99)
Glucose-Capillary: 272 mg/dL — ABNORMAL HIGH (ref 70–99)

## 2022-02-23 LAB — VANCOMYCIN, TROUGH
Vancomycin Tr: 10 ug/mL — ABNORMAL LOW (ref 15–20)
Vancomycin Tr: 10 ug/mL — ABNORMAL LOW (ref 15–20)

## 2022-02-23 MED ORDER — METOPROLOL SUCCINATE ER 25 MG PO TB24
25.0000 mg | ORAL_TABLET | Freq: Two times a day (BID) | ORAL | Status: DC
  Administered 2022-02-23 – 2022-03-02 (×15): 25 mg via ORAL
  Filled 2022-02-23 (×15): qty 1

## 2022-02-23 MED ORDER — MELATONIN 5 MG PO TABS
5.0000 mg | ORAL_TABLET | Freq: Every evening | ORAL | Status: DC | PRN
  Administered 2022-02-24 – 2022-02-25 (×2): 5 mg via ORAL
  Filled 2022-02-23 (×2): qty 1

## 2022-02-23 MED ORDER — VANCOMYCIN HCL 1250 MG/250ML IV SOLN
1250.0000 mg | INTRAVENOUS | Status: DC
  Administered 2022-02-24 – 2022-03-01 (×6): 1250 mg via INTRAVENOUS
  Filled 2022-02-23 (×7): qty 250

## 2022-02-23 NOTE — Progress Notes (Signed)
Pharmacy Antibiotic Note ? ?Kelly Lee is a 71 y.o. female admitted on 02/14/2022 with MSSA bacteremia likely secondary to leg wounds. She developed a severe rash and hypotension on Cefazolin which has been stopped.  Pharmacy is consulted to dose vancomcyin. Scr is stable at <1 as of yesterday.  ? ?Vancomycin levels collected this morning showed a peak of 23 and a trough of 10 resulting in a subtherapeutic AUC of 383.  ? ?Plan: ?Increase Vancomycin to 1250 mg every 24 hours (Predicted AUC 478 with Cmin 11) ?Follow renal function, cultures and clinical course ?Plan Vancomycin through 4/20 ? ?Height: 5\' 1"  (154.9 cm) ?Weight: 86.5 kg (190 lb 11.2 oz) ?IBW/kg (Calculated) : 47.8 ? ?Temp (24hrs), Avg:97.7 ?F (36.5 ?C), Min:97.5 ?F (36.4 ?C), Max:98 ?F (36.7 ?C) ? ?Recent Labs  ?Lab 02/17/22 ?2042 02/18/22 ?0600 02/18/22 ?1903 02/18/22 ?2349 02/19/22 ?0848 02/20/22 ?0028 02/20/22 ?0654 02/20/22 ?[id number] 02/20/22 ?1328 02/21/22 ?[id number] 02/22/22 ?0600 02/23/22 ?[id number] 02/23/22 ?0455 02/23/22 ?1002  ?WBC  --    < >  --  13.9* 20.9* 18.2*  --   --  13.6* 12.5* 8.2  --   --   --   ?CREATININE  --    < >  --  0.57 0.58  --   --  0.47  --  0.52 0.41*  --   --   --   ?LATICACIDVEN 2.0*  --  3.0* 1.5 1.4  --  1.0  --   --   --   --   --   --   --   ?Mallory  --   --   --   --   --   --   --   --   --   --   --  10* 10*  --   ?VANCOPEAK  --   --   --   --   --   --   --   --   --   --   --   --   --  23*  ? < > = values in this interval not displayed.  ? ?  ?Estimated Creatinine Clearance: 63.5 mL/min (A) (by C-G formula based on SCr of 0.41 mg/dL (L)).   ? ?Allergies  ?Allergen Reactions  ? Penicillins Hives and Shortness Of Breath  ? Cefazolin In Sodium Chloride Rash  ?  Previously tolerated cefazolin in 2022 but developed full body rash in April 2023 after several days of cefazolin   ? Azithromycin Hives  ? Cephalexin Hives  ? Silvadene [Silver Sulfadiazine] Other (See Comments)  ?  "Burns" the skin  ? Chlorhexidine Rash  ?  Wound Dressing Adhesive Rash  ? ? ?Antimicrobials this admission:  ?Levaquin 4/4 >> 4/5 ?Ancef 4/5 >> 4/9 ?Vanc 4/9 >>(4/20) ?Cefepime 4/9 >>4/11 ?  ?Microbiology results:  ?4/4 BCx: now 2/3 MSSA, BCID w/ Staph aureus no resistance ?4/4 COVID/flu: negative ?4/5 Legionella: ?4/5 Strep pneumo: negative ?4/5 MRSA PCR: pos ?4/6 Bcx: NGF ? ? ?Thank you for allowing pharmacy to be a part of this patient?s care. ? ?Jimmy Footman, PharmD, BCPS, BCIDP ?Infectious Diseases Clinical Pharmacist ?Phone: [phone] ?02/23/2022 10:48 AM ? ? ? ?

## 2022-02-23 NOTE — Progress Notes (Signed)
? ?PROGRESS NOTE ? ? ? ?Kelly Lee  [id number] DOB: Mar 28, 1951 DOA: 02/14/2022 ?PCP: Theodosia Blender, PA-C ? ? ?Brief Narrative: ?71 y.o. female with medical history significant of HTN, DM2. Presenting with weakness. She reports that she has felt weak for at least the last 4 days. She has had poor PO intake during that time. She has had nausea, but no vomiting. She reports productive cough, but no fever. She tried APAP, advil and allergy meds to help; however, they didn't work. In addition, she has notice some draining from her pannus wound. When she didn't feel better today, she called EMS to take her to the ED for evaluation. ? ? ?Assessment and Plan: ? ?Right upper lobe pneumonia ?Noted on initial imaging.  Patient initially started on Levaquin was transitioned to antibiotics to cover MSSA bacteremia as mentioned below.  Patient has completed antibiotic course for pneumonia. ? ?Sepsis secondary to MSSA ?In setting of MSSA bacteremia and right upper lobe pneumonia in addition to chronic pannus wound.  Patient empirically treated with antibiotics, vancomycin and cefepime.  Patient transition to cefazolin but developed a rash.  She was transitioned back to vancomycin for treatment of MSSA bacteremia. ?-Continue vancomycin with goal of 14 days total antibiotics (day #14 of treatment on April 19) ? ?MSSA bacteremia ?1/2 sets of blood cultures (4/4) significant for MSSA bacteremia.  Repeat blood cultures (4/6) with no growth.  TTE and TEE without evidence of vegetations.  ID consulted as mentioned above. ?-For antibiotics see problem, sepsis ? ?Chronic panus wound ?Concern for possible source of above.  Wound care consulted. ?-Continue wound care ? ?Rash ?Macular rash.  Unsure of etiology but concern for possible DRESS.  No eosinophilia on CBC with differential.  Possibly antibiotic allergic reaction as she does have a history of hives reaction to penicillin and cephalexin.  Patient started on prednisone with some  improvement of her rash.  Currently not hypotensive.  Currently no other symptoms concerning for anaphylaxis. ? ?Hypotension/Septic shock ?Uncertain etiology but in relation to rash vs infection.  Patient required Levophed for just over 24 hours.  Successfully discontinued.  Random cortisol 4.8 not diagnostic of adrenal insufficiency. Patient also started on midodrine.  Hypotension is significantly improved now. Midodrine discontinued. Blood pressure stable. ? ?Hypokalemia ?Patient given repletion.  Now resolved. ? ?Hypomagnesemia ?Patient given repletion.  Now resolved. ? ?Paroxysmal atrial fibrillation ?Currently in sinus rhythm.  Patient on metoprolol and Eliquis as an outpatient.  Metoprolol is held secondary to hypotension requiring vasopressor support. ?-Continue Eliquis ?-Resume metoprolol ? ?Diabetes mellitus, type 2 ?Patient is on metformin, Jardiance, Lantus as an outpatient.  As hemoglobin A1c of 9.7% on February 14, 2022.  Blood sugars uncontrolled while inpatient.  Fasting blood sugar of 205 today which is likely worsened secondary to initiation of prednisone.  Semglee was previously discontinued secondary to hypoglycemia. ?-Continue SSI ?-Contineu Semglee at 20 units nightly ? ?Primary hypertension ?Patient with hypotension ? ?History of DVT ?-Continue Eliquis ? ?CAD ?Hyperlipidemia ?Patient is on Pravastatin and Zetia as an outpatient ?-Continue Zetia and Pravastatin ? ?Hyperthyroidism ?-Continue methimazole ? ? ?DVT prophylaxis: Eliquis ?Code Status:   Code Status: Full Code ?Family Communication: None at bedside ?Disposition Plan: Discharge home vs SNF pending stable blood pressure, continued improvement of rash, PT/OT recommendations, outpatient antibiotic regimen. Anticipate medical readiness for discharge in 1-3 days ? ? ?Consultants:  ?Infectious disease ?PCCM ? ?Procedures:  ? ? ?Antimicrobials: ?Vancomycin ?Cefazolin ?Cefepime ? ? ?Subjective: ?No significant issues overnight. Continues to breath  better. Cough with sputum.  ? ?Objective: ?BP (!) 142/79 (BP Location: Right Arm)   Pulse (!) 110   Temp 97.7 ?F (36.5 ?C) (Oral)   Resp (!) 25   Ht 5\' 1"  (1.549 m)   Wt 86.5 kg   SpO2 92%   BMI 36.03 kg/m?  ? ?Examination: ? ?General exam: Appears calm and comfortable  ?Respiratory system: Clear to auscultation. Respiratory effort normal. ?Cardiovascular system: S1 & S2 heard, RRR. No murmurs, rubs, gallops or clicks. ?Gastrointestinal system: Abdomen is nondistended, soft and nontender. No organomegaly or masses felt. Normal bowel sounds heard. ?Central nervous system: Alert and oriented. No focal neurological deficits. ?Musculoskeletal: No edema. No calf tenderness ?Psychiatry: Judgement and insight appear normal. Mood & affect appropriate.  ? ? ?Data Reviewed: I have personally reviewed following labs and imaging studies ? ?CBC ?Lab Results  ?Component Value Date  ? WBC 8.2 02/22/2022  ? RBC 3.08 (L) 02/22/2022  ? HGB 8.5 (L) 02/22/2022  ? HCT 27.6 (L) 02/22/2022  ? MCV 89.6 02/22/2022  ? MCH 27.6 02/22/2022  ? PLT 382 02/22/2022  ? MCHC 30.8 02/22/2022  ? RDW 16.0 (H) 02/22/2022  ? LYMPHSABS 1.2 02/20/2022  ? MONOABS 0.7 02/20/2022  ? EOSABS 0.0 02/20/2022  ? BASOSABS 0.0 02/20/2022  ? ? ? ?Last metabolic panel ?Lab Results  ?Component Value Date  ? NA 134 (L) 02/22/2022  ? K 4.7 02/22/2022  ? CL 99 02/22/2022  ? CO2 32 02/22/2022  ? BUN 10 02/22/2022  ? CREATININE 0.41 (L) 02/22/2022  ? GLUCOSE 217 (H) 02/22/2022  ? GFRNONAA >60 02/22/2022  ? GFRAA >60 12/17/2019  ? CALCIUM 8.3 (L) 02/22/2022  ? PHOS 2.4 (L) 02/16/2022  ? PROT 6.1 (L) 02/22/2022  ? ALBUMIN 2.2 (L) 02/22/2022  ? BILITOT 0.5 02/22/2022  ? ALKPHOS 60 02/22/2022  ? AST 10 (L) 02/22/2022  ? ALT 6 02/22/2022  ? ANIONGAP 3 (L) 02/22/2022  ? ? ?GFR: ?Estimated Creatinine Clearance: 63.5 mL/min (A) (by C-G formula based on SCr of 0.41 mg/dL (L)). ? ?Recent Results (from the past 240 hour(s))  ?Resp Panel by RT-PCR (Flu A&B, Covid) Nasopharyngeal  Swab     Status: None  ? Collection Time: 02/14/22  1:45 PM  ? Specimen: Nasopharyngeal Swab; Nasopharyngeal(NP) swabs in vial transport medium  ?Result Value Ref Range Status  ? SARS Coronavirus 2 by RT PCR NEGATIVE NEGATIVE Final  ?  Comment: (NOTE) ?SARS-CoV-2 target nucleic acids are NOT DETECTED. ? ?The SARS-CoV-2 RNA is generally detectable in upper respiratory ?specimens during the acute phase of infection. The lowest ?concentration of SARS-CoV-2 viral copies this assay can detect is ?138 copies/mL. A negative result does not preclude SARS-Cov-2 ?infection and should not be used as the sole basis for treatment or ?other patient management decisions. A negative result may occur with  ?improper specimen collection/handling, submission of specimen other ?than nasopharyngeal swab, presence of viral mutation(s) within the ?areas targeted by this assay, and inadequate number of viral ?copies(<138 copies/mL). A negative result must be combined with ?clinical observations, patient history, and epidemiological ?information. The expected result is Negative. ? ?Fact Sheet for Patients:  ?EntrepreneurPulse.com.au ? ?Fact Sheet for Healthcare Providers:  ?IncredibleEmployment.be ? ?This test is no t yet approved or cleared by the [place] FDA and  ?has been authorized for detection and/or diagnosis of SARS-CoV-2 by ?FDA under an Emergency Use Authorization (EUA). This EUA will remain  ?in effect (meaning this test can be used) for the duration of the ?  COVID-19 declaration under Section 564(b)(1) of the Act, 21 ?U.S.C.section 360bbb-3(b)(1), unless the authorization is terminated  ?or revoked sooner.  ? ? ?  ? Influenza A by PCR NEGATIVE NEGATIVE Final  ? Influenza B by PCR NEGATIVE NEGATIVE Final  ?  Comment: (NOTE) ?The Xpert Xpress SARS-CoV-2/FLU/RSV plus assay is intended as an aid ?in the diagnosis of influenza from Nasopharyngeal swab specimens and ?should not be used as a sole  basis for treatment. Nasal washings and ?aspirates are unacceptable for Xpert Xpress SARS-CoV-2/FLU/RSV ?testing. ? ?Fact Sheet for Patients: ?EntrepreneurPulse.com.au ? ?Fact Sheet for [place]

## 2022-02-23 NOTE — Progress Notes (Signed)
Pt stable at this time. Pt's belongings with them. Report called.  ?

## 2022-02-24 ENCOUNTER — Inpatient Hospital Stay: Payer: Self-pay

## 2022-02-24 DIAGNOSIS — E119 Type 2 diabetes mellitus without complications: Secondary | ICD-10-CM | POA: Diagnosis not present

## 2022-02-24 DIAGNOSIS — J189 Pneumonia, unspecified organism: Secondary | ICD-10-CM | POA: Diagnosis not present

## 2022-02-24 DIAGNOSIS — R7881 Bacteremia: Secondary | ICD-10-CM | POA: Diagnosis not present

## 2022-02-24 DIAGNOSIS — T50905A Adverse effect of unspecified drugs, medicaments and biological substances, initial encounter: Secondary | ICD-10-CM | POA: Diagnosis not present

## 2022-02-24 LAB — BASIC METABOLIC PANEL
Anion gap: 4 — ABNORMAL LOW (ref 5–15)
BUN: 11 mg/dL (ref 8–23)
CO2: 33 mmol/L — ABNORMAL HIGH (ref 22–32)
Calcium: 8.9 mg/dL (ref 8.9–10.3)
Chloride: 98 mmol/L (ref 98–111)
Creatinine, Ser: 0.57 mg/dL (ref 0.44–1.00)
GFR, Estimated: >60 mL/min (ref >60)
Glucose, Bld: 156 mg/dL — ABNORMAL HIGH (ref 70–99)
Potassium: 4.6 mmol/L (ref 3.5–5.1)
Sodium: 135 mmol/L (ref 135–145)

## 2022-02-24 LAB — GLUCOSE, CAPILLARY
Glucose-Capillary: 129 mg/dL — ABNORMAL HIGH (ref 70–99)
Glucose-Capillary: 198 mg/dL — ABNORMAL HIGH (ref 70–99)
Glucose-Capillary: 189 mg/dL — ABNORMAL HIGH (ref 70–99)
Glucose-Capillary: 263 mg/dL — ABNORMAL HIGH (ref 70–99)

## 2022-02-24 MED ORDER — SODIUM CHLORIDE 0.9% FLUSH: 10.0000 mL | INTRAVENOUS | Status: DC | PRN

## 2022-02-24 MED ORDER — SODIUM CHLORIDE 0.9% FLUSH
10.0000 mL | Freq: Two times a day (BID) | INTRAVENOUS | Status: DC
  Administered 2022-02-27 – 2022-03-01 (×3): 10 mL

## 2022-02-24 NOTE — TOC Initial Note (Signed)
Transition of Care (TOC) - Initial/Assessment Note  ? ? ?Patient Details  ?Name: Kelly Lee ?MRN: [id number] ?Date of Birth: 07/04/1951 ? ?Transition of Care (TOC) CM/SW Contact:    ?Kelly Tat, LCSW ?Phone Number: ?02/24/2022, 12:39 PM ? ?Clinical Narrative:                 ?Received TOC order for SNF placement assistance due to need for IV abx through 4/19 and family unable to manage this.  Met with pt and spoke with daughter today to review dc plans.  Pt confirms that her daughter does live with her, however, is working and unable to be there 24/7.  Daughter confirms that she does not feel that she can manage IV abx in the home.  Discussed with both that the home IV abx dosing might actually be simpler than imagined and asked if daughter might be willing to watch a video on how the planned Vanc is dosed in the home.  Daughter agreeable and I have spoken with Kelly Lee with [employer] who will send this video to daughter to view today.  I have ALSO sent out the [facility] to area SNFs and will work on this as the dc plan unless daughter states that she feels she can manage abx in the home and would like to change plan back to home. ? ?Expected Discharge Plan: [place] (vs. Home with [facility]) ?Barriers to Discharge: Continued Medical Work up ? ? ?Patient Goals and CMS Choice ?Patient states their goals for this hospitalization and ongoing recovery are:: return home ?  ?  ? ?Expected Discharge Plan and Services ?Expected Discharge Plan: [place] (vs. Home with [facility]) ?In-house Referral: Clinical Social Work ?  ?  ?Living arrangements for the past 2 months: [place] ?                ?  ?  ?  ?  ?  ?  ?  ?  ?  ?  ? ?Prior Living Arrangements/Services ?Living arrangements for the past 2 months: [place] ?Lives with:: Adult Children ?Patient language and need for interpreter reviewed:: Yes ?Do you feel safe going back to the place where you live?: Yes      ?Need for Family  Participation in Patient Care: Yes (Comment) ?Care giver support system in place?: Yes (comment) ?  ?Criminal Activity/Legal Involvement Pertinent to Current Situation/Hospitalization: No - Comment as needed ? ?Activities of Daily Living ?Home Assistive Devices/Equipment: None ?ADL Screening (condition at time of admission) ?Patient's cognitive ability adequate to safely complete daily activities?: Yes ?Is the patient deaf or have difficulty hearing?: No ?Does the patient have difficulty seeing, even when wearing glasses/contacts?: No ?Does the patient have difficulty concentrating, remembering, or making decisions?: No ?Patient able to express need for assistance with ADLs?: Yes ?Does the patient have difficulty dressing or bathing?: No ?Independently performs ADLs?: Yes (appropriate for developmental age) ?Does the patient have difficulty walking or climbing stairs?: No ?Weakness of Legs: None ?Weakness of Arms/Hands: None ? ?Permission Sought/Granted ?Permission sought to share information with : Facility Sport and exercise psychologist, Family Supports ?Permission granted to share information with : Yes, Verbal Permission Granted ? Share Information with NAME: Kelly Lee ?   ? Permission granted to share info w Relationship: daughter ? Permission granted to share info w Contact Information: [phone] ? ?Emotional Assessment ?Appearance:: Appears stated age ?Attitude/Demeanor/Rapport: Gracious ?Affect (typically observed): Accepting ?Orientation: : Oriented to Self, Oriented to Place, Oriented to  Time, Oriented to Situation ?Alcohol / Substance Use: Not Applicable ?Psych Involvement: No (comment) ? ?Admission diagnosis:  PNA (pneumonia) [J18.9] ?Community acquired pneumonia, unspecified laterality [J18.9] ?Patient Active Problem List  ? Diagnosis Date Noted  ? Septic shock ([place])   ? Drug reaction   ? Bacteremia   ? Fever   ? Community acquired pneumonia   ? PNA (pneumonia) 02/14/2022  ? History of DVT (deep vein  thrombosis) 02/14/2022  ? Hypokalemia 02/14/2022  ? Hypocalcemia 02/14/2022  ? Pressure injury of skin 12/21/2021  ? Critical lower limb ischemia ([place]) 12/15/2021  ? Cardiomyopathy ([place]) 12/15/2021  ? Anemia 12/11/2019  ? S/p left hip fracture 12/07/2019  ? Diabetes mellitus without complication ([place])   ? Hypertension   ? Chronic respiratory failure with hypoxia ([place]) 09/26/2018  ? Dyslipidemia 09/26/2018  ? PAF (paroxysmal atrial fibrillation) ([place]) 09/26/2018  ? Anticoagulant long-term use 02/16/2018  ? 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 Morbid obesity with BMI of 40.0-44.9, adult ([place]) 02/16/2018  ? Typical atrial flutter ([place]) 02/15/2018  ? Hyperthyroidism 01/10/2018  ? Type 2 diabetes mellitus, with long-term current use of insulin ([place]) 01/09/2018  ? ?PCP:  Kelly Blender, PA-C ?Pharmacy:   ?[id number]?[id number]?Phone: [phone]: [phone]? ? ? ? ?Social Determinants of Health (SDOH) Interventions ?  ? ?Readmission Risk Interventions ? ?  12/12/2019  ?  1:40 PM  ?Readmission Risk Prevention Plan  ?Transportation Screening Complete  ?PCP or Specialist Appt within 5-7 Days Not Complete  ?Not Complete comments plan for SNF  ?Home Care Screening Complete  ?Medication Review (RN CM) Referral to Pharmacy  ? ? ? ?

## 2022-02-24 NOTE — Care Management Important Message (Signed)
Important Message ? ?Patient Details IM Letter given to the Patient. ?Name: Kelly Lee ?MRN: [id number] ?Date of Birth: 1951-09-10 ? ? ?Medicare Important Message Given:  Yes ? ? ? ? ?Kerin Salen ?02/24/2022, 1:40 PM ?

## 2022-02-24 NOTE — NC FL2 (Signed)
?[place] MEDICAID FL2 LEVEL OF CARE SCREENING TOOL  ?  ? ?IDENTIFICATION  ?Patient Name: ?Kelly Lee Birthdate: May 14, 1951 Sex: female Admission Date (Current Location): ?02/14/2022  ?[place] and [place] Number: ? [place] ?  Facility and Address:  ?[facility],  [place], [place] ?     Provider Number: ?[id number]  ?Attending Physician Name and Address:  ?Mariel Aloe, MD ? Relative Name and Phone Number:  ?daughter, [name] [phone] ?   ?Current Level of Care: ?Hospital Recommended Level of Care: ?[place] Prior Approval Number: ?  ? ?Date Approved/Denied: ?  PASRR Number: ?[id number] ? ?Discharge Plan: ?SNF ?  ? ?Current Diagnoses: ?Patient Active Problem List  ? Diagnosis Date Noted  ? Septic shock ([place])   ? Drug reaction   ? Bacteremia   ? Fever   ? Community acquired pneumonia   ? PNA (pneumonia) 02/14/2022  ? History of DVT (deep vein thrombosis) 02/14/2022  ? Hypokalemia 02/14/2022  ? Hypocalcemia 02/14/2022  ? Pressure injury of skin 12/21/2021  ? Critical lower limb ischemia ([place]) 12/15/2021  ? Cardiomyopathy ([place]) 12/15/2021  ? Anemia 12/11/2019  ? S/p left hip fracture 12/07/2019  ? Diabetes mellitus without complication ([place])   ? Hypertension   ? Chronic respiratory failure with hypoxia ([place]) 09/26/2018  ? Dyslipidemia 09/26/2018  ? PAF (paroxysmal atrial fibrillation) ([place]) 09/26/2018  ? Anticoagulant long-term use 02/16/2018  ? 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 Morbid obesity with BMI of 40.0-44.9, adult ([place]) 02/16/2018  ? Typical atrial flutter ([place]) 02/15/2018  ? Hyperthyroidism 01/10/2018  ? Type 2 diabetes mellitus, with long-term current use of insulin ([place]) 01/09/2018  ? ? ?Orientation RESPIRATION BLADDER Height & Weight   ?  ?Self, Time, Situation, Place ? Normal Continent Weight: 190 lb 11.2 oz (86.5 kg) ?Height:  5\' 1"  (154.9 cm)  ?BEHAVIORAL  SYMPTOMS/MOOD NEUROLOGICAL BOWEL NUTRITION STATUS  ?    Continent    ?AMBULATORY STATUS COMMUNICATION OF NEEDS Skin   ?Limited Assist Verbally Other (Comment) (Chronic panus wound) ?  ?  ?  ?    ?     ?     ? ? ?Personal Care Assistance Level of Assistance  ?Bathing, Dressing Bathing Assistance: Limited assistance ?  ?Dressing Assistance: Limited assistance ?   ? ?Functional Limitations Info  ?    ?  ?   ? ? ?SPECIAL CARE FACTORS FREQUENCY  ?PT (By licensed PT), OT (By licensed OT)   ?  ?PT Frequency: 5x/wk ?OT Frequency: 5x/wk ?  ?  ?  ?   ? ? ?Contractures    ? ? ?Additional Factors Info  ?    ?  ?  ?  ?  ?   ? ?Current Medications (02/24/2022):  This is the current hospital active medication list ?Current Facility-Administered Medications  ?Medication Dose Route Frequency Provider Last Rate Last Admin  ? acetaminophen (TYLENOL) tablet 650 mg  650 mg Oral Q6H PRN Mariel Aloe, MD   650 mg at 02/22/22 2114  ? Or  ? acetaminophen (TYLENOL) suppository 650 mg  650 mg Rectal Q6H PRN Mariel Aloe, MD      ? albuterol (PROVENTIL) (2.5 MG/3ML) 0.083% nebulizer solution 2.5 mg  2.5 mg Nebulization Q2H PRN Mariel Aloe, MD      ? apixaban (ELIQUIS) tablet 5 mg  5 mg Oral BID Mariel Aloe, MD   5 mg at  02/24/22 1001  ? calcium carbonate (TUMS - dosed in mg elemental calcium) chewable tablet 200 mg of elemental calcium  1 tablet Oral BID Mariel Aloe, MD   200 mg of elemental calcium at 02/24/22 1001  ? clopidogrel (PLAVIX) tablet 75 mg  75 mg Oral Daily Mariel Aloe, MD   75 mg at 02/24/22 1002  ? diphenhydrAMINE (BENADRYL) injection 25 mg  25 mg Intravenous Q6H PRN Mariel Aloe, MD   25 mg at 02/22/22 1337  ? ezetimibe (ZETIA) tablet 10 mg  10 mg Oral QHS Mariel Aloe, MD   10 mg at 02/23/22 2216  ? famotidine (PEPCID) tablet 20 mg  20 mg Oral BID Mariel Aloe, MD   20 mg at 02/24/22 1001  ? guaiFENesin (MUCINEX) 12 hr tablet 600 mg  600 mg Oral BID Mariel Aloe, MD   600 mg at 02/24/22 1001  ?  guaiFENesin-dextromethorphan (ROBITUSSIN DM) 100-10 MG/5ML syrup 5 mL  5 mL Oral Q4H PRN Mariel Aloe, MD   5 mL at 02/23/22 1154  ? hydrocerin (EUCERIN) cream   Topical BID PRN Mariel Aloe, MD   1 application. at 02/22/22 2113  ? insulin aspart (novoLOG) injection 0-20 Units  0-20 Units Subcutaneous TID WC Mariel Aloe, MD   4 Units at 02/24/22 1151  ? insulin aspart (novoLOG) injection 0-5 Units  0-5 Units Subcutaneous QHS Mariel Aloe, MD   3 Units at 02/23/22 2218  ? insulin glargine-yfgn (SEMGLEE) injection 20 Units  20 Units Subcutaneous QHS Mariel Aloe, MD   20 Units at 02/23/22 2256  ? melatonin tablet 5 mg  5 mg Oral QHS PRN Mariel Aloe, MD      ? methimazole (TAPAZOLE) tablet 5 mg  5 mg Oral Daily Mariel Aloe, MD   5 mg at 02/24/22 1002  ? metoprolol succinate (TOPROL-XL) 24 hr tablet 25 mg  25 mg Oral BID Mariel Aloe, MD   25 mg at 02/24/22 1001  ? ondansetron (ZOFRAN) tablet 4 mg  4 mg Oral Q6H PRN Mariel Aloe, MD      ? Or  ? ondansetron (ZOFRAN) injection 4 mg  4 mg Intravenous Q6H PRN Mariel Aloe, MD   4 mg at 02/18/22 [id number]  ? pantoprazole (PROTONIX) EC tablet 40 mg  40 mg Oral QAC breakfast Mariel Aloe, MD   40 mg at 02/24/22 0749  ? vancomycin (VANCOREADY) IVPB 1250 mg/250 mL  1,250 mg Intravenous Q24H Susa Raring, RPH 166.7 mL/hr at 02/24/22 0557 1,250 mg at 02/24/22 0557  ? ? ? ?Discharge Medications: ?Please see discharge summary for a list of discharge medications. ? ?Relevant Imaging Results: ? ?Relevant Lab Results: ? ? ?Additional Information ?SSN [id number]; IV abx through 03/01/22 ? ?Preslynn Bier, LCSW ? ? ? ? ?

## 2022-02-24 NOTE — Progress Notes (Signed)
Physical Therapy Treatment ?Patient Details ?Name: Kelly Lee ?MRN: [id number] ?DOB: 12/12/1950 ?Today's Date: 02/24/2022 ? ? ?History of Present Illness 71 y.o. female Presenting with weakness on 02/14/22. Patient found to have pna, drainage from pannus wound. Pt with medical history significant of HTN, DM2. ? ?  ?PT Comments  ? ? Pt making good progress.  Does fatigue easily, but has stable gait with RW and min cues for RW proximity.  Continue to progress as able. Continue to recommend home from a mobility perspective.    ?Recommendations for follow up therapy are one component of a multi-disciplinary discharge planning process, led by the attending physician.  Recommendations may be updated based on patient status, additional functional criteria and insurance authorization. ? ?Follow Up Recommendations ? Home health PT (had prior to admission) ?  ?  ?Assistance Recommended at Discharge Intermittent Supervision/Assistance  ?Patient can return home with the following Help with stairs or ramp for entrance;Assistance with cooking/housework;Assist for transportation;A little help with walking and/or transfers ?  ?Equipment Recommendations ? None recommended by PT  ?  ?Recommendations for Other Services   ? ? ?  ?Precautions / Restrictions Precautions ?Precautions: None  ?  ? ?Mobility ? Bed Mobility ?  ?  ?  ?  ?  ?  ?  ?General bed mobility comments: in chair ?  ? ?Transfers ?Overall transfer level: Needs assistance ?Equipment used: Rolling walker (2 wheels) ?Transfers: Sit to/from Stand ?Sit to Stand: Supervision ?  ?  ?  ?  ?  ?General transfer comment: Supervision to stand from chair and toilet.  Performed ADLs independently ?  ? ?Ambulation/Gait ?Ambulation/Gait assistance: Supervision ?Gait Distance (Feet): 65 Feet (10' then 78') ?Assistive device: Rolling walker (2 wheels) ?Gait Pattern/deviations: Step-through pattern, Trunk flexed ?  ?  ?  ?General Gait Details: Min cues for posture and RW proximity (tended  to get too close) ? ? ?Stairs ?  ?  ?  ?  ?  ? ? ?Wheelchair Mobility ?  ? ?Modified Rankin (Stroke Patients Only) ?  ? ? ?  ?Balance Overall balance assessment: Needs assistance ?Sitting-balance support: No upper extremity supported ?Sitting balance-Leahy Scale: Good ?  ?  ?Standing balance support: No upper extremity supported, Bilateral upper extremity supported ?Standing balance-Leahy Scale: Fair ?Standing balance comment: RW to ambulate but could static stand and ADLs without support ?  ?  ?  ?  ?  ?  ?  ?  ?  ?  ?  ?  ? ?  ?Cognition Arousal/Alertness: Awake/alert ?Behavior During Therapy: [facility] for tasks assessed/performed ?Overall Cognitive Status: Within Functional Limits for tasks assessed ?  ?  ?  ?  ?  ?  ?  ?  ?  ?  ?  ?  ?  ?  ?  ?  ?  ?  ?  ? ?  ?Exercises   ? ?  ?General Comments General comments (skin integrity, edema, etc.): HR up to 102 bpm with activity ?  ?  ? ?Pertinent Vitals/Pain Pain Assessment ?Pain Assessment: No/denies pain  ? ? ?Home Living   ?  ?  ?  ?  ?  ?  ?  ?  ?  ?   ?  ?Prior Function    ?  ?  ?   ? ?PT Goals (current goals can now be found in the care plan section) Progress towards PT goals: Progressing toward goals ? ?  ?Frequency ? ? ? Min 3X/week ? ? ? ?  ?  PT Plan Current plan remains appropriate  ? ? ?Co-evaluation   ?  ?  ?  ?  ? ?  ?AM-PAC PT "6 Clicks" Mobility   ?Outcome Measure ? Help needed turning from your back to your side while in a flat bed without using bedrails?: A Little ?Help needed moving from lying on your back to sitting on the side of a flat bed without using bedrails?: A Little ?Help needed moving to and from a bed to a chair (including a wheelchair)?: A Little ?Help needed standing up from a chair using your arms (e.g., wheelchair or bedside chair)?: A Little ?Help needed to walk in hospital room?: A Little ?Help needed climbing 3-5 steps with a railing? : A Little ?6 Click Score: 18 ? ?  ?End of Session Equipment Utilized During Treatment: Gait  belt ?Activity Tolerance: Patient tolerated treatment well ?Patient left: in chair;with call bell/phone within reach ?Nurse Communication: Mobility status ?PT Visit Diagnosis: Unsteadiness on feet (R26.81) ?  ? ? ?Time: [id number] ?PT Time Calculation (min) (ACUTE ONLY): 11 min ? ?Charges:  $Gait Training: 8-22 mins          ?          ? ?Abran Richard, PT ?Acute Rehab Services ?Pager [phone] ?[facility] [phone] ? ? ? ?Kelly Lee ?02/24/2022, 5:52 PM ? ?

## 2022-02-24 NOTE — Progress Notes (Signed)
? ?PROGRESS NOTE ? ? ? ?Kelly Lee  [id number] DOB: August 08, 1951 DOA: 02/14/2022 ?PCP: Theodosia Blender, PA-C ? ? ?Brief Narrative: ?71 y.o. female with medical history significant of HTN, DM2. Presenting with weakness. She reports that she has felt weak for at least the last 4 days. She has had poor PO intake during that time. She has had nausea, but no vomiting. She reports productive cough, but no fever. She tried APAP, advil and allergy meds to help; however, they didn't work. In addition, she has notice some draining from her pannus wound. When she didn't feel better today, she called EMS to take her to the ED for evaluation. ? ? ?Assessment and Plan: ? ?Right upper lobe pneumonia ?Noted on initial imaging.  Patient initially started on Levaquin was transitioned to antibiotics to cover MSSA bacteremia as mentioned below.  Patient has completed antibiotic course for pneumonia. ? ?Sepsis secondary to MSSA ?In setting of MSSA bacteremia and right upper lobe pneumonia in addition to chronic pannus wound.  Patient empirically treated with antibiotics, vancomycin and cefepime.  Patient transition to cefazolin but developed a rash.  She was transitioned back to vancomycin for treatment of MSSA bacteremia. ?-Continue vancomycin with goal of 14 days total antibiotics (day #14 of treatment on April 19) ? ?MSSA bacteremia ?1/2 sets of blood cultures (4/4) significant for MSSA bacteremia.  Repeat blood cultures (4/6) with no growth.  TTE and TEE without evidence of vegetations.  ID consulted as mentioned above. ?-For antibiotics see problem, sepsis ? ?Chronic panus wound ?Concern for possible source of above.  Wound care consulted. ?-Continue wound care ? ?Rash ?Macular rash.  Unsure of etiology but concern for possible DRESS.  No eosinophilia on CBC with differential.  Possibly antibiotic allergic reaction as she does have a history of hives reaction to penicillin and cephalexin.  Patient started on prednisone with some  improvement of her rash.  Currently not hypotensive.  Currently no other symptoms concerning for anaphylaxis. ? ?Hypotension/Septic shock ?Uncertain etiology but in relation to rash vs infection.  Patient required Levophed for just over 24 hours.  Successfully discontinued.  Random cortisol 4.8 not diagnostic of adrenal insufficiency. Patient also started on midodrine.  Hypotension is significantly improved now. Midodrine discontinued. Blood pressure stable. ? ?Hypokalemia ?Patient given repletion.  Now resolved. ? ?Hypomagnesemia ?Patient given repletion.  Now resolved. ? ?Paroxysmal atrial fibrillation ?Currently in sinus rhythm.  Patient on metoprolol and Eliquis as an outpatient.  Metoprolol is held secondary to hypotension requiring vasopressor support. ?-Continue Eliquis ?-Continue metoprolol ? ?Diabetes mellitus, type 2 ?Patient is on metformin, Jardiance, Lantus as an outpatient.  As hemoglobin A1c of 9.7% on February 14, 2022.  Blood sugars uncontrolled while inpatient.  Fasting blood sugar of 205 today which is likely worsened secondary to initiation of prednisone.  Semglee was previously discontinued secondary to hypoglycemia. ?-Continue SSI ?-[place]ineu Semglee at 20 units nightly ? ?Primary hypertension ?Patient with hypotension ? ?History of DVT ?-Continue Eliquis ? ?CAD ?Hyperlipidemia ?Patient is on Pravastatin and Zetia as an outpatient ?-Continue Zetia and Pravastatin ? ?Hyperthyroidism ?-Continue methimazole ? ? ?DVT prophylaxis: Eliquis ?Code Status:   Code Status: Full Code ?Family Communication: None at bedside ?Disposition Plan: Discharge home vs SNF. Medically stable for discharge to SNF now or medically stable for home after IV antibiotics have completed. ? ? ?Consultants:  ?Infectious disease ?PCCM ? ?Procedures:  ? ? ?Antimicrobials: ?Vancomycin ?Cefazolin ?Cefepime ? ? ?Subjective: ?Continues to have productive cough. No other issues overnight. ? ?Objective: ?BP  131/75 (BP Location: Left Arm)    Pulse 90   Temp 98.6 ?F (37 ?C) (Oral)   Resp 18   Ht 5\' 1"  (1.549 m)   Wt 86.5 kg   SpO2 99%   BMI 36.03 kg/m?  ? ?Examination: ? ?General exam: Appears calm and comfortable ?Respiratory system: Diminished. Respiratory effort normal. ?Cardiovascular system: S1 & S2 heard, RRR.  ?Gastrointestinal system: Abdomen is nondistended, soft and nontender. Normal bowel sounds heard. ?Central nervous system: Alert and oriented. No focal neurological deficits. ?Psychiatry: Judgement and insight appear normal. Mood & affect appropriate.  ? ? ?Data Reviewed: I have personally reviewed following labs and imaging studies ? ?CBC ?Lab Results  ?Component Value Date  ? WBC 8.2 02/22/2022  ? RBC 3.08 (L) 02/22/2022  ? HGB 8.5 (L) 02/22/2022  ? HCT 27.6 (L) 02/22/2022  ? MCV 89.6 02/22/2022  ? MCH 27.6 02/22/2022  ? PLT 382 02/22/2022  ? MCHC 30.8 02/22/2022  ? RDW 16.0 (H) 02/22/2022  ? LYMPHSABS 1.2 02/20/2022  ? MONOABS 0.7 02/20/2022  ? EOSABS 0.0 02/20/2022  ? BASOSABS 0.0 02/20/2022  ? ? ? ?Last metabolic panel ?Lab Results  ?Component Value Date  ? NA 135 02/24/2022  ? K 4.6 02/24/2022  ? CL 98 02/24/2022  ? CO2 33 (H) 02/24/2022  ? BUN 11 02/24/2022  ? CREATININE 0.57 02/24/2022  ? GLUCOSE 156 (H) 02/24/2022  ? GFRNONAA >60 02/24/2022  ? GFRAA >60 12/17/2019  ? CALCIUM 8.9 02/24/2022  ? PHOS 2.4 (L) 02/16/2022  ? PROT 6.1 (L) 02/22/2022  ? ALBUMIN 2.2 (L) 02/22/2022  ? BILITOT 0.5 02/22/2022  ? ALKPHOS 60 02/22/2022  ? AST 10 (L) 02/22/2022  ? ALT 6 02/22/2022  ? ANIONGAP 4 (L) 02/24/2022  ? ? ?GFR: ?Estimated Creatinine Clearance: 63.5 mL/min (by C-G formula based on SCr of 0.57 mg/dL). ? ?Recent Results (from the past 240 hour(s))  ?Resp Panel by RT-PCR (Flu A&B, Covid) Nasopharyngeal Swab     Status: None  ? Collection Time: 02/14/22  1:45 PM  ? Specimen: Nasopharyngeal Swab; Nasopharyngeal(NP) swabs in vial transport medium  ?Result Value Ref Range Status  ? SARS Coronavirus 2 by RT PCR NEGATIVE NEGATIVE Final  ?   Comment: (NOTE) ?SARS-CoV-2 target nucleic acids are NOT DETECTED. ? ?The SARS-CoV-2 RNA is generally detectable in upper respiratory ?specimens during the acute phase of infection. The lowest ?concentration of SARS-CoV-2 viral copies this assay can detect is ?138 copies/mL. A negative result does not preclude SARS-Cov-2 ?infection and should not be used as the sole basis for treatment or ?other patient management decisions. A negative result may occur with  ?improper specimen collection/handling, submission of specimen other ?than nasopharyngeal swab, presence of viral mutation(s) within the ?areas targeted by this assay, and inadequate number of viral ?copies(<138 copies/mL). A negative result must be combined with ?clinical observations, patient history, and epidemiological ?information. The expected result is Negative. ? ?Fact Sheet for Patients:  ?EntrepreneurPulse.com.au ? ?Fact Sheet for Healthcare Providers:  ?IncredibleEmployment.be ? ?This test is no t yet approved or cleared by the [place] FDA and  ?has been authorized for detection and/or diagnosis of SARS-CoV-2 by ?FDA under an Emergency Use Authorization (EUA). This EUA will remain  ?in effect (meaning this test can be used) for the duration of the ?COVID-19 declaration under Section 564(b)(1) of the Act, 21 ?U.S.C.section 360bbb-3(b)(1), unless the authorization is terminated  ?or revoked sooner.  ? ? ?  ? Influenza A by PCR NEGATIVE NEGATIVE Final  ?  Influenza B by PCR NEGATIVE NEGATIVE Final  ?  Comment: (NOTE) ?The Xpert Xpress SARS-CoV-2/FLU/RSV plus assay is intended as an aid ?in the diagnosis of influenza from Nasopharyngeal swab specimens and ?should not be used as a sole basis for treatment. Nasal washings and ?aspirates are unacceptable for Xpert Xpress SARS-CoV-2/FLU/RSV ?testing. ? ?Fact Sheet for Patients: ?EntrepreneurPulse.com.au ? ?Fact Sheet for Healthcare  Providers: ?IncredibleEmployment.be ? ?This test is not yet approved or cleared by the [place] FDA and ?has been authorized for detection and/or diagnosis of SARS-CoV-2 by ?FDA under an Emergency Use Autho

## 2022-02-24 NOTE — Progress Notes (Signed)
Peripherally Inserted Central Catheter Placement ? ?The IV Nurse has discussed with the patient and/or persons authorized to consent for the patient, the purpose of this procedure and the potential benefits and risks involved with this procedure.  The benefits include less needle sticks, lab draws from the catheter, and the patient may be discharged home with the catheter. Risks include, but not limited to, infection, bleeding, blood clot (thrombus formation), and puncture of an artery; nerve damage and irregular heartbeat and possibility to perform a PICC exchange if needed/ordered by physician.  Alternatives to this procedure were also discussed.  Bard Power PICC patient education guide, fact sheet on infection prevention and patient information card has been provided to patient /or left at bedside.   ? ?PICC Placement Documentation  ?PICC Single Lumen [id number] Right Basilic 39 cm 0 cm (Active)  ?Indication for Insertion or Continuance of Line Limited venous access - need for IV therapy >5 days (PICC only) 02/24/22 2235  ?Exposed Catheter (cm) 0 cm 02/24/22 2235  ?Site Assessment Clean, Dry, Intact 02/24/22 2235  ?Line Status Flushed;Saline locked;Blood return noted 02/24/22 2235  ?Dressing Type Securing device;Transparent 02/24/22 2235  ?Dressing Status Antimicrobial disc in place;Clean, Dry, Intact 02/24/22 2235  ?Dressing Intervention New dressing 02/24/22 2235  ?Dressing Change Due 03/03/22 02/24/22 2235  ? ? ? ? ? ?Shon Hale ?02/24/2022, 10:53 PM ? ?

## 2022-02-25 DIAGNOSIS — J189 Pneumonia, unspecified organism: Secondary | ICD-10-CM | POA: Diagnosis not present

## 2022-02-25 DIAGNOSIS — T50905A Adverse effect of unspecified drugs, medicaments and biological substances, initial encounter: Secondary | ICD-10-CM | POA: Diagnosis not present

## 2022-02-25 DIAGNOSIS — R7881 Bacteremia: Secondary | ICD-10-CM | POA: Diagnosis not present

## 2022-02-25 DIAGNOSIS — E119 Type 2 diabetes mellitus without complications: Secondary | ICD-10-CM | POA: Diagnosis not present

## 2022-02-25 LAB — GLUCOSE, CAPILLARY
Glucose-Capillary: 179 mg/dL — ABNORMAL HIGH (ref 70–99)
Glucose-Capillary: 246 mg/dL — ABNORMAL HIGH (ref 70–99)
Glucose-Capillary: 189 mg/dL — ABNORMAL HIGH (ref 70–99)
Glucose-Capillary: 251 mg/dL — ABNORMAL HIGH (ref 70–99)

## 2022-02-25 MED ORDER — POLYETHYLENE GLYCOL 3350 17 G PO PACK
17.0000 g | PACK | Freq: Two times a day (BID) | ORAL | Status: DC
  Administered 2022-02-25 – 2022-02-26 (×3): 17 g via ORAL
  Filled 2022-02-25 (×3): qty 1

## 2022-02-25 NOTE — TOC Progression Note (Signed)
Transition of Care (TOC) - Progression Note  ? ? ?Patient Details  ?Name: Kelly Lee ?MRN: [id number] ?Date of Birth: [birth year] ? ?Transition of Care (TOC) CM/SW Contact  ?Kaleisha Bhargava, Marta Lamas, LCSW ?Phone Number: ?02/25/2022, 10:37 AM ? ?Clinical Narrative:    ? ?CSW received request from [facility] colleague, Lennart Pall to follow-up with patient's daughter, [name] [phone]) to discuss whether or not she feels comfortable administering IV antibiotics to patient in the home, after reviewing video.  Ms. Ninfa Meeker was reluctant, but agreed to watch video on how the planned Vancomycin is dosed in the home.  Patient will require IV antibiotic administration through 03/01/2022.  Ms. Ninfa Meeker currently resides with patient, but continues to work full-time, limiting her ability to provide 24 hour care and supervision.  Patient and daughter realize the alternative is SNF placement.  Carolynn Sayers, RN with [facility], sent the video to Ms. Goforth for review on 02/24/2022.  Ms. Donata Clay faxed patient's FL-2 Form to all area SNF's to try and pursue bed offers, in the event that daughter states not feeling comfortable managing IV antibiotics in the home.  CSW has made several attempts to try and contact Ms. Goforth today, without success.  CSW is unable to leave HIPAA compliant messages, as mailbox has not been set up.  CSW will continue to try and outreach Ms. Goforth throughout the day today. ? ? ?Expected Discharge Plan: [place] (vs. Home with [facility]) ?Barriers to Discharge: Continued Medical Work up ? ?Expected Discharge Plan and Services ?Expected Discharge Plan: [place] (vs. Home with [facility]) ?In-house Referral: Clinical Social Work ?  ?  ?Living arrangements for the past 2 months: [place] ?                ? ?SNF versus home with home health RN for IV antibiotic administration. ? ?Social Determinants of Health (SDOH) Interventions ?  ? ?Readmission Risk Interventions ? ?  12/12/2019   ?  1:40 PM  ?Readmission Risk Prevention Plan  ?Transportation Screening Complete  ?PCP or Specialist Appt within 5-7 Days Not Complete  ?Not Complete comments plan for SNF  ?Home Care Screening Complete  ?Medication Review (RN CM) Referral to Pharmacy  ? ? ?

## 2022-02-25 NOTE — Progress Notes (Signed)
? ?PROGRESS NOTE ? ? ? ?Kelly Lee  [id number] DOB: May 06, 1951 DOA: 02/14/2022 ?PCP: Theodosia Blender, PA-C ? ? ?Brief Narrative: ?71 y.o. female with medical history significant of HTN, DM2. Presenting with weakness. She reports that she has felt weak for at least the last 4 days. She has had poor PO intake during that time. She has had nausea, but no vomiting. She reports productive cough, but no fever. She tried APAP, advil and allergy meds to help; however, they didn't work. In addition, she has notice some draining from her pannus wound. When she didn't feel better today, she called EMS to take her to the ED for evaluation. ? ? ?Assessment and Plan: ? ?Right upper lobe pneumonia ?Noted on initial imaging.  Patient initially started on Levaquin was transitioned to antibiotics to cover MSSA bacteremia as mentioned below.  Patient has completed antibiotic course for pneumonia. ? ?Sepsis secondary to MSSA ?In setting of MSSA bacteremia and right upper lobe pneumonia in addition to chronic pannus wound.  Patient empirically treated with antibiotics, vancomycin and cefepime.  Patient transition to cefazolin but developed a rash.  She was transitioned back to vancomycin for treatment of MSSA bacteremia. ?-Continue vancomycin with goal of 14 days total antibiotics (day #14 of treatment on April 19) ? ?MSSA bacteremia ?1/2 sets of blood cultures (4/4) significant for MSSA bacteremia.  Repeat blood cultures (4/6) with no growth.  TTE and TEE without evidence of vegetations.  ID consulted as mentioned above. ?-For antibiotics see problem, sepsis ? ?Chronic panus wound ?Concern for possible source of above.  Wound care consulted. ?-Continue wound care ? ?Rash ?Macular rash.  Unsure of etiology but concern for possible DRESS.  No eosinophilia on CBC with differential.  Possibly antibiotic allergic reaction as she does have a history of hives reaction to penicillin and cephalexin.  Patient started on prednisone with some  improvement of her rash.  Currently not hypotensive.  Currently no other symptoms concerning for anaphylaxis. ? ?Hypotension/Septic shock ?Uncertain etiology but in relation to rash vs infection.  Patient required Levophed for just over 24 hours.  Successfully discontinued.  Random cortisol 4.8 not diagnostic of adrenal insufficiency. Patient also started on midodrine.  Hypotension is significantly improved now. Midodrine discontinued. Blood pressure stable. ? ?Hypokalemia ?Patient given repletion.  Now resolved. ? ?Hypomagnesemia ?Patient given repletion.  Now resolved. ? ?Paroxysmal atrial fibrillation ?Currently in sinus rhythm.  Patient on metoprolol and Eliquis as an outpatient.  Metoprolol is held secondary to hypotension requiring vasopressor support. ?-Continue Eliquis ?-Continue metoprolol ? ?Diabetes mellitus, type 2 ?Patient is on metformin, Jardiance, Lantus as an outpatient.  As hemoglobin A1c of 9.7% on February 14, 2022.  Blood sugars uncontrolled while inpatient.  Fasting blood sugar of 205 today which is likely worsened secondary to initiation of prednisone.  Semglee was previously discontinued secondary to hypoglycemia. ?-Continue SSI ?-Continue Semglee at 20 units nightly ? ?Primary hypertension ?Patient with hypotension ? ?History of DVT ?-Continue Eliquis ? ?CAD ?Hyperlipidemia ?Patient is on Pravastatin and Zetia as an outpatient ?-Continue Zetia and Pravastatin ? ?Hyperthyroidism ?-Continue methimazole ? ?Constipation ?-MiraLAX ? ? ?DVT prophylaxis: Eliquis ?Code Status:   Code Status: Full Code ?Family Communication: None at bedside ?Disposition Plan: Discharge home vs SNF. Medically stable for discharge to SNF now or medically stable for home after IV antibiotics have completed. ? ? ?Consultants:  ?Infectious disease ?PCCM ? ?Procedures:  ? ? ?Antimicrobials: ?Vancomycin ?Cefazolin ?Cefepime ? ? ?Subjective: ?Concerned about constipation. No other issues. ? ?Objective: ?BP  122/75 (BP Location:  Left Arm)   Pulse (!) 107   Temp 97.9 ?F (36.6 ?C) (Oral)   Resp 18   Ht 5\' 1"  (1.549 m)   Wt 86.5 kg   SpO2 96%   BMI 36.03 kg/m?  ? ?Examination: ? ?General exam: Appears calm and comfortable ?Respiratory system: Clear to auscultation. Respiratory effort normal. ?Cardiovascular system: S1 & S2 heard, RRR. ?Gastrointestinal system: Abdomen is nondistended, soft and nontender. No organomegaly or masses felt. Normal bowel sounds heard. ?Central nervous system: Alert and oriented. No focal neurological deficits. ?Musculoskeletal: No calf tenderness ?Skin: No cyanosis. No rashes ?Psychiatry: Judgement and insight appear normal. Mood & affect appropriate.  ? ? ?Data Reviewed: I have personally reviewed following labs and imaging studies ? ?CBC ?Lab Results  ?Component Value Date  ? WBC 8.2 02/22/2022  ? RBC 3.08 (L) 02/22/2022  ? HGB 8.5 (L) 02/22/2022  ? HCT 27.6 (L) 02/22/2022  ? MCV 89.6 02/22/2022  ? MCH 27.6 02/22/2022  ? PLT 382 02/22/2022  ? MCHC 30.8 02/22/2022  ? RDW 16.0 (H) 02/22/2022  ? LYMPHSABS 1.2 02/20/2022  ? MONOABS 0.7 02/20/2022  ? EOSABS 0.0 02/20/2022  ? BASOSABS 0.0 02/20/2022  ? ? ? ?Last metabolic panel ?Lab Results  ?Component Value Date  ? NA 135 02/24/2022  ? K 4.6 02/24/2022  ? CL 98 02/24/2022  ? CO2 33 (H) 02/24/2022  ? BUN 11 02/24/2022  ? CREATININE 0.57 02/24/2022  ? GLUCOSE 156 (H) 02/24/2022  ? GFRNONAA >60 02/24/2022  ? GFRAA >60 12/17/2019  ? CALCIUM 8.9 02/24/2022  ? PHOS 2.4 (L) 02/16/2022  ? PROT 6.1 (L) 02/22/2022  ? ALBUMIN 2.2 (L) 02/22/2022  ? BILITOT 0.5 02/22/2022  ? ALKPHOS 60 02/22/2022  ? AST 10 (L) 02/22/2022  ? ALT 6 02/22/2022  ? ANIONGAP 4 (L) 02/24/2022  ? ? ?GFR: ?Estimated Creatinine Clearance: 63.5 mL/min (by C-G formula based on SCr of 0.57 mg/dL). ? ?Recent Results (from the past 240 hour(s))  ?Culture, blood (routine x 2)     Status: None  ? Collection Time: 02/16/22  4:49 AM  ? Specimen: BLOOD RIGHT HAND  ?Result Value Ref Range Status  ? Specimen  Description   Final  ?  BLOOD RIGHT HAND ?Performed at [facility], [place] [address], [place], [place] [address] ?  ? Special Requests   Final  ?  BOTTLES DRAWN AEROBIC ONLY Blood Culture results may not be optimal due to an inadequate volume of blood received in culture bottles ?Performed at [facility], [place] [address] [place], [place] [address] ?  ? Culture   Final  ?  NO GROWTH 5 DAYS ?Performed at [facility], [place] [address] [place], [place] [address] ?  ? Report Status 02/21/2022 FINAL  Final  ?Culture, blood (routine x 2)     Status: None  ? Collection Time: 02/16/22  4:49 AM  ? Specimen: BLOOD RIGHT ARM  ?Result Value Ref Range Status  ? Specimen Description   Final  ?  BLOOD RIGHT ARM ?Performed at [facility], [place] [address], [place], [place] [address] ?  ? Special Requests   Final  ?  BOTTLES DRAWN AEROBIC ONLY Blood Culture results may not be optimal due to an inadequate volume of blood received in culture bottles ?Performed at [facility], [place] [address], [place], [place] [address] ?  ? Culture   Final  ?  NO GROWTH 5  DAYS ?Performed at [facility], [place] [address] [place], [place] [address] ?  ? Report Status 02/21/2022 FINAL  Final  ?MRSA Next Gen by PCR, Nasal     Status: Abnormal  ? Collection Time: 02/18/22 11:08 PM  ? Specimen: Nasal Mucosa; Nasal Swab  ?Result Value Ref Range Status  ? MRSA by PCR Next Gen DETECTED (A) NOT DETECTED Final  ?  Comment: (NOTE) ?The GeneXpert MRSA Assay (FDA approved for NASAL specimens only), ?is one component of a comprehensive MRSA colonization surveillance ?program. It is not intended to diagnose MRSA infection nor to guide ?or monitor treatment for MRSA infections. ?Test performance is not FDA approved in patients less than 2 years ?old. ?Performed at [facility], [address] ?[place], [place] [address] ?  ?  ? ? ?Radiology Studies: ?[place] EKG SITE  RITE ? ?Result Date: 02/24/2022 ?If [employer] not attached, placement could not be confirmed due to current cardiac rhythm.  ? ? ? LOS: 11 days  ? ? ?Cordelia Poche, MD ?[facility] ?02/25/2022, 3:50 PM ?

## 2022-02-26 DIAGNOSIS — R7881 Bacteremia: Secondary | ICD-10-CM | POA: Diagnosis not present

## 2022-02-26 DIAGNOSIS — Z7901 Long term (current) use of anticoagulants: Secondary | ICD-10-CM | POA: Diagnosis not present

## 2022-02-26 DIAGNOSIS — J189 Pneumonia, unspecified organism: Secondary | ICD-10-CM | POA: Diagnosis not present

## 2022-02-26 LAB — GLUCOSE, CAPILLARY
Glucose-Capillary: 175 mg/dL — ABNORMAL HIGH (ref 70–99)
Glucose-Capillary: 177 mg/dL — ABNORMAL HIGH (ref 70–99)
Glucose-Capillary: 206 mg/dL — ABNORMAL HIGH (ref 70–99)
Glucose-Capillary: 234 mg/dL — ABNORMAL HIGH (ref 70–99)
Glucose-Capillary: 240 mg/dL — ABNORMAL HIGH (ref 70–99)
Glucose-Capillary: 282 mg/dL — ABNORMAL HIGH (ref 70–99)

## 2022-02-26 LAB — CREATININE, SERUM
Creatinine, Ser: 0.55 mg/dL (ref 0.44–1.00)
GFR, Estimated: >60 mL/min (ref >60)

## 2022-02-26 LAB — CBC
HCT: 33.7 % — ABNORMAL LOW (ref 36.0–46.0)
Hemoglobin: 10.4 g/dL — ABNORMAL LOW (ref 12.0–15.0)
MCH: 27.1 pg (ref 26.0–34.0)
MCHC: 30.9 g/dL (ref 30.0–36.0)
MCV: 87.8 fL (ref 80.0–100.0)
Platelets: 546 10*3/uL — ABNORMAL HIGH (ref 150–400)
RBC: 3.84 MIL/uL — ABNORMAL LOW (ref 3.87–5.11)
RDW: 16.5 % — ABNORMAL HIGH (ref 11.5–15.5)
WBC: 8.2 10*3/uL (ref 4.0–10.5)
nRBC: 0 % (ref 0.0–0.2)

## 2022-02-26 MED ORDER — POLYETHYLENE GLYCOL 3350 17 G PO PACK: 17.0000 g | PACK | Freq: Every day | ORAL | Status: DC | PRN

## 2022-02-26 NOTE — Progress Notes (Signed)
? ?PROGRESS NOTE ? ? ? ?Kelly Lee  [id number] DOB: 1951-08-06 DOA: 02/14/2022 ?PCP: Theodosia Blender, PA-C ? ? ?Brief Narrative: ?71 y.o. female with medical history significant of HTN, DM2. Presenting with weakness. She reports that she has felt weak for at least the last 4 days. She has had poor PO intake during that time. She has had nausea, but no vomiting. She reports productive cough, but no fever. She tried APAP, advil and allergy meds to help; however, they didn't work. In addition, she has notice some draining from her pannus wound. When she didn't feel better today, she called EMS to take her to the ED for evaluation. ? ? ?Assessment and Plan: ? ?Right upper lobe pneumonia ?Noted on initial imaging.  Patient initially started on Levaquin was transitioned to antibiotics to cover MSSA bacteremia as mentioned below.  Patient has completed antibiotic course for pneumonia. ? ?Sepsis secondary to MSSA ?In setting of MSSA bacteremia and right upper lobe pneumonia in addition to chronic pannus wound.  Patient empirically treated with antibiotics, vancomycin and cefepime.  Patient transition to cefazolin but developed a rash.  She was transitioned back to vancomycin for treatment of MSSA bacteremia. ?-Continue vancomycin with goal of 14 days total antibiotics (day #14 of treatment on April 19) ? ?MSSA bacteremia ?1/2 sets of blood cultures (4/4) significant for MSSA bacteremia.  Repeat blood cultures (4/6) with no growth.  TTE and TEE without evidence of vegetations.  ID consulted as mentioned above. ?-For antibiotics see problem, sepsis ? ?Chronic panus wound ?Concern for possible source of above.  Wound care consulted. ?-Continue wound care ? ?Rash ?Macular rash.  Unsure of etiology but concern for possible DRESS.  No eosinophilia on CBC with differential.  Possibly antibiotic allergic reaction as she does have a history of hives reaction to penicillin and cephalexin.  Patient started on prednisone with some  improvement of her rash.  Currently not hypotensive.  Currently no other symptoms concerning for anaphylaxis. ? ?Hypotension/Septic shock ?Uncertain etiology but in relation to rash vs infection.  Patient required Levophed for just over 24 hours.  Successfully discontinued.  Random cortisol 4.8 not diagnostic of adrenal insufficiency. Patient also started on midodrine.  Hypotension is significantly improved now. Midodrine discontinued. Blood pressure stable. ? ?Hypokalemia ?Patient given repletion.  Now resolved. ? ?Hypomagnesemia ?Patient given repletion.  Now resolved. ? ?Paroxysmal atrial fibrillation ?Currently in sinus rhythm.  Patient on metoprolol and Eliquis as an outpatient.  Metoprolol is held secondary to hypotension requiring vasopressor support. ?-Continue Eliquis ?-Continue metoprolol ? ?Diabetes mellitus, type 2 ?Patient is on metformin, Jardiance, Lantus as an outpatient.  As hemoglobin A1c of 9.7% on February 14, 2022.  Blood sugars uncontrolled while inpatient.  Fasting blood sugar of 205 today which is likely worsened secondary to initiation of prednisone.  Semglee was previously discontinued secondary to hypoglycemia. ?-Continue SSI ?-Continue Semglee at 20 units nightly ? ?Primary hypertension ?Patient with hypotension ? ?History of DVT ?-Continue Eliquis ? ?CAD ?Hyperlipidemia ?Patient is on Pravastatin and Zetia as an outpatient ?-Continue Zetia and Pravastatin ? ?Hyperthyroidism ?-Continue methimazole ? ?Constipation ?Patient produced multiple bowel movements ?-Switch to MiraLAX prn ? ? ?DVT prophylaxis: Eliquis ?Code Status:   Code Status: Full Code ?Family Communication: None at bedside ?Disposition Plan: Discharge home vs SNF. Medically stable for discharge to SNF now or medically stable for home after IV antibiotics have completed. ? ? ?Consultants:  ?Infectious disease ?PCCM ? ?Procedures:  ? ? ?Antimicrobials: ?Vancomycin ?Cefazolin ?Cefepime ? ? ?Subjective: ?No  issues noted  overnight. ? ?Objective: ?BP 103/63 (BP Location: Left Arm)   Pulse 89   Temp 97.8 ?F (36.6 ?C) (Oral)   Resp 16   Ht 5\' 1"  (1.549 m)   Wt 86.5 kg   SpO2 93%   BMI 36.03 kg/m?  ? ?Examination: ? ?General: Well appearing, no distress ? ? ?Data Reviewed: I have personally reviewed following labs and imaging studies ? ?CBC ?Lab Results  ?Component Value Date  ? WBC 8.2 02/26/2022  ? RBC 3.84 (L) 02/26/2022  ? HGB 10.4 (L) 02/26/2022  ? HCT 33.7 (L) 02/26/2022  ? MCV 87.8 02/26/2022  ? MCH 27.1 02/26/2022  ? PLT 546 (H) 02/26/2022  ? MCHC 30.9 02/26/2022  ? RDW 16.5 (H) 02/26/2022  ? LYMPHSABS 1.2 02/20/2022  ? MONOABS 0.7 02/20/2022  ? EOSABS 0.0 02/20/2022  ? BASOSABS 0.0 02/20/2022  ? ? ? ?Last metabolic panel ?Lab Results  ?Component Value Date  ? NA 135 02/24/2022  ? K 4.6 02/24/2022  ? CL 98 02/24/2022  ? CO2 33 (H) 02/24/2022  ? BUN 11 02/24/2022  ? CREATININE 0.55 02/26/2022  ? GLUCOSE 156 (H) 02/24/2022  ? GFRNONAA >60 02/26/2022  ? GFRAA >60 12/17/2019  ? CALCIUM 8.9 02/24/2022  ? PHOS 2.4 (L) 02/16/2022  ? PROT 6.1 (L) 02/22/2022  ? ALBUMIN 2.2 (L) 02/22/2022  ? BILITOT 0.5 02/22/2022  ? ALKPHOS 60 02/22/2022  ? AST 10 (L) 02/22/2022  ? ALT 6 02/22/2022  ? ANIONGAP 4 (L) 02/24/2022  ? ? ?GFR: ?Estimated Creatinine Clearance: 63.5 mL/min (by C-G formula based on SCr of 0.55 mg/dL). ? ?Recent Results (from the past 240 hour(s))  ?MRSA Next Gen by PCR, Nasal     Status: Abnormal  ? Collection Time: 02/18/22 11:08 PM  ? Specimen: Nasal Mucosa; Nasal Swab  ?Result Value Ref Range Status  ? MRSA by PCR Next Gen DETECTED (A) NOT DETECTED Final  ?  Comment: (NOTE) ?The GeneXpert MRSA Assay (FDA approved for NASAL specimens only), ?is one component of a comprehensive MRSA colonization surveillance ?program. It is not intended to diagnose MRSA infection nor to guide ?or monitor treatment for MRSA infections. ?Test performance is not FDA approved in patients less than 2 years ?old. ?Performed at [facility], [address] ?[place], [place] [address] ?  ?  ? ? ?Radiology Studies: ?[place] EKG SITE RITE ? ?Result Date: 02/24/2022 ?If [employer] not attached, placement could not be confirmed due to current cardiac rhythm.  ? ? ? LOS: 12 days  ? ? ?Cordelia Poche, MD ?[facility] ?02/26/2022, 10:25 AM ? ? ?If 7PM-7AM, please contact night-coverage ?[email or web address] ? ?

## 2022-02-26 NOTE — Progress Notes (Signed)
Pharmacy Antibiotic Note ? ?Kelly Lee is a 71 y.o. female admitted on 02/14/2022 with MSSA bacteremia likely secondary to leg wounds. She developed a severe rash and hypotension on Cefazolin which has been stopped.  Pharmacy is consulted to dose vancomcyin.  ? ?Day #10/14 Vancomycin  ?- Afebrile ?- SCr stable 0.55 ?- WBC WNL ? ?Levels/dose adjustments: ?4/13 VP 23, VT 10 >> AUC 383 >> incr dose to 1250mg  q24h  ? ?Plan: ?Continue Vancomycin 1250 mg every 24 hours (Predicted AUC 478 with Cmin 11) ?Follow renal function, cultures and clinical course ?Plan Vancomycin through 4/20 ? ?Height: 5\' 1"  (154.9 cm) ?Weight: 86.5 kg (190 lb 11.2 oz) ?IBW/kg (Calculated) : 47.8 ? ?Temp (24hrs), Avg:97.9 ?F (36.6 ?C), Min:97.8 ?F (36.6 ?C), Max:97.9 ?F (36.6 ?C) ? ?Recent Labs  ?Lab 02/20/22 ?0028 02/20/22 ?0654 02/20/22 ?04/22/22 02/20/22 ?1328 02/21/22 ?04/22/22 02/22/22 ?0600 02/23/22 ?0313 02/23/22 ?0455 02/23/22 ?1002 02/24/22 ?0428 02/26/22 ?0500  ?WBC 18.2*  --   --  13.6* 12.5* 8.2  --   --   --   --  8.2  ?CREATININE  --   --  0.47  --  0.52 0.41*  --   --   --  0.57 0.55  ?LATICACIDVEN  --  1.0  --   --   --   --   --   --   --   --   --   ?VANCOTROUGH  --   --   --   --   --   --  10* 10*  --   --   --   ?VANCOPEAK  --   --   --   --   --   --   --   --  23*  --   --   ? ?  ?Estimated Creatinine Clearance: 63.5 mL/min (by C-G formula based on SCr of 0.55 mg/dL).   ? ?Allergies  ?Allergen Reactions  ? Penicillins Hives and Shortness Of Breath  ? Cefazolin In Sodium Chloride Rash  ?  Previously tolerated cefazolin in 2022 but developed full body rash in April 2023 after several days of cefazolin   ? Azithromycin Hives  ? Cephalexin Hives  ? Silvadene [Silver Sulfadiazine] Other (See Comments)  ?  "Burns" the skin  ? Chlorhexidine Rash  ? Wound Dressing Adhesive Rash  ? ? ?Antimicrobials this admission:  ?Levaquin 4/4 x 1  ?Cefazolin 4/5 >> 4/9 ?Cefepime 4/9 x 1 ?Vanc 4/9 >> (4/20) ?  ?Microbiology results:  ?4/4 BCx: now 2/3  MSSA, BCID w/ Staph aureus no resistance ?4/4 COVID/flu: negative ?4/5 Legionella: negative ?4/5 Strep pneumo: negative ?4/5 MRSA PCR: pos ?4/6 Bcx: NGF ? ? ?Thank you for allowing pharmacy to be a part of this patient?s care. ? ?6/4, PharmD, BCPS ?Pharmacy: 6/6 ?02/26/2022 9:17 AM ? ? ? ?

## 2022-02-27 DIAGNOSIS — R7881 Bacteremia: Secondary | ICD-10-CM | POA: Diagnosis not present

## 2022-02-27 DIAGNOSIS — J189 Pneumonia, unspecified organism: Secondary | ICD-10-CM | POA: Diagnosis not present

## 2022-02-27 DIAGNOSIS — E119 Type 2 diabetes mellitus without complications: Secondary | ICD-10-CM | POA: Diagnosis not present

## 2022-02-27 DIAGNOSIS — T50905A Adverse effect of unspecified drugs, medicaments and biological substances, initial encounter: Secondary | ICD-10-CM | POA: Diagnosis not present

## 2022-02-27 LAB — GLUCOSE, CAPILLARY
Glucose-Capillary: 231 mg/dL — ABNORMAL HIGH (ref 70–99)
Glucose-Capillary: 263 mg/dL — ABNORMAL HIGH (ref 70–99)
Glucose-Capillary: 104 mg/dL — ABNORMAL HIGH (ref 70–99)
Glucose-Capillary: 305 mg/dL — ABNORMAL HIGH (ref 70–99)

## 2022-02-27 NOTE — Progress Notes (Signed)
Physical Therapy Treatment ?Patient Details ?Name: Kelly Lee ?MRN: [id number] ?DOB: 1951-04-07 ?Today's Date: 02/27/2022 ? ? ?History of Present Illness 71 y.o. female Presenting with weakness on 02/14/22. Patient found to have pna, drainage from pannus wound. Pt with medical history significant of HTN, DM2. ? ?  ?PT Comments  ? ? Pt in generally good spirits and feels she is progressing well, agreeable to be seen. Pt completed sit to stand exercises for BLE strengthening with good form and short seated rest break, supervision for safety only. Ambulated ~169ft in hallway with RW, supervision with recliner follow for safety only with two rest breaks, VSS. Feel pt is progressing well and discharge destination remains appropriate given pt's functional limitations. We will continue to follow her acutely to promote modified independence with functional mobility.  ?  ?Recommendations for follow up therapy are one component of a multi-disciplinary discharge planning process, led by the attending physician.  Recommendations may be updated based on patient status, additional functional criteria and insurance authorization. ? ?Follow Up Recommendations ? Home health PT (had HHPT PTA) ?  ?  ?Assistance Recommended at Discharge Intermittent Supervision/Assistance  ?Patient can return home with the following Help with stairs or ramp for entrance;Assistance with cooking/housework;Assist for transportation;A little help with walking and/or transfers ?  ?Equipment Recommendations ? None recommended by PT  ?  ?Recommendations for Other Services   ? ? ?  ?Precautions / Restrictions Precautions ?Precautions: None ?Restrictions ?Weight Bearing Restrictions: No  ?  ? ?Mobility ? Bed Mobility ?Overal bed mobility: Modified Independent ?  ?  ?  ?  ?  ?  ?General bed mobility comments: Pt mod I for increased time ?  ? ?Transfers ?Overall transfer level: Needs assistance ?Equipment used: Rolling walker (2 wheels) ?Transfers: Sit to/from  Stand ?Sit to Stand: Supervision ?  ?  ?  ?  ?  ?General transfer comment: Pt supervision for safety only, no physical assist required. ?  ? ?Ambulation/Gait ?Ambulation/Gait assistance: Supervision, Min guard ?Gait Distance (Feet): 130 Feet ?Assistive device: Rolling walker (2 wheels) ?Gait Pattern/deviations: Step-through pattern, Trunk flexed, Knee flexed in stance - right, Narrow base of support ?Gait velocity: decreasde ?  ?  ?General Gait Details: Pt ambulated at supervision level of assist with recliner follow for safety only, no physical assist required. Encouraged pt to lift head to pathfind, pt able to adjust posture accordingly. Standing rest break after ~19ft, 30min seated rest break after ~76ft, SpO2 97% with HR 110 via portable pulse oximeter. Demonstrated narrow BOS, trunk flexed, with L knee buckling into valgus positioning and bilateral pes planus. No overt LOB noted. Pt reported PTA they could ambulate with RW for bouts of 200+ft to get from house to car. ? ? ?Stairs ?  ?  ?  ?  ?  ? ? ?Wheelchair Mobility ?  ? ?Modified Rankin (Stroke Patients Only) ?  ? ? ?  ?Balance Overall balance assessment: Needs assistance ?Sitting-balance support: No upper extremity supported, Feet supported ?Sitting balance-Leahy Scale: Good ?  ?  ?Standing balance support: Bilateral upper extremity supported, During functional activity, Reliant on assistive device for balance ?Standing balance-Leahy Scale: Poor ?Standing balance comment: Required BUE support on RW during ambulation task but able to static stand without support. ?  ?  ?  ?  ?  ?  ?  ?  ?  ?  ?  ?  ? ?  ?Cognition Arousal/Alertness: Awake/alert ?Behavior During Therapy: [facility] for tasks assessed/performed ?Overall Cognitive Status: Within Functional  Limits for tasks assessed ?  ?  ?  ?  ?  ?  ?  ?  ?  ?  ?  ?  ?  ?  ?  ?  ?  ?  ?  ? ?  ?Exercises Other Exercises ?Other Exercises: 10x sit to stand from bed with BUE providing assist. ? ?  ?General Comments   ?  ?   ? ?Pertinent Vitals/Pain    ? ? ?Home Living   ?  ?  ?  ?  ?  ?  ?  ?  ?  ?   ?  ?Prior Function    ?  ?  ?   ? ?PT Goals (current goals can now be found in the care plan section) Acute Rehab PT Goals ?PT Goal Formulation: With patient ?Time For Goal Achievement: 03/08/22 ?Potential to Achieve Goals: Good ?Progress towards PT goals: Progressing toward goals ? ?  ?Frequency ? ? ? Min 3X/week ? ? ? ?  ?PT Plan Current plan remains appropriate  ? ? ?Co-evaluation   ?  ?  ?  ?  ? ?  ?AM-PAC PT "6 Clicks" Mobility   ?Outcome Measure ? Help needed turning from your back to your side while in a flat bed without using bedrails?: A Little ?Help needed moving from lying on your back to sitting on the side of a flat bed without using bedrails?: A Little ?Help needed moving to and from a bed to a chair (including a wheelchair)?: A Little ?Help needed standing up from a chair using your arms (e.g., wheelchair or bedside chair)?: A Little ?Help needed to walk in hospital room?: A Little ?Help needed climbing 3-5 steps with a railing? : A Little ?6 Click Score: 18 ? ?  ?End of Session Equipment Utilized During Treatment: Gait belt ?Activity Tolerance: Patient tolerated treatment well ?Patient left: in chair;with call bell/phone within reach ?Nurse Communication: Mobility status ?PT Visit Diagnosis: Unsteadiness on feet (R26.81) ?  ? ? ?Time: [id number] ?PT Time Calculation (min) (ACUTE ONLY): 24 min ? ?Charges:  $Gait Training: 8-22 mins ?$Therapeutic Exercise: 8-22 mins          ?          ?Coolidge Breeze, PT, DPT ?[facility] ?Office: [phone] ?Pager: [phone] ? ? ?Coolidge Breeze ?02/27/2022, 11:43 AM ? ?

## 2022-02-27 NOTE — Progress Notes (Signed)
? ?PROGRESS NOTE ? ? ? ?Kelly Lee  [id number] DOB: [birth year] DOA: 02/14/2022 ?PCP: Oval Linsey, PA-C ? ? ?Brief Narrative: ?71 y.o. female with medical history significant of HTN, DM2. Presenting with weakness. She reports that she has felt weak for at least the last 4 days. She has had poor PO intake during that time. She has had nausea, but no vomiting. She reports productive cough, but no fever. She tried APAP, advil and allergy meds to help; however, they didn't work. In addition, she has notice some draining from her pannus wound. When she didn't feel better today, she called EMS to take her to the ED for evaluation. ? ? ?Assessment and Plan: ? ?Right upper lobe pneumonia ?Noted on initial imaging.  Patient initially started on Levaquin was transitioned to antibiotics to cover MSSA bacteremia as mentioned below.  Patient has completed antibiotic course for pneumonia. ? ?Sepsis secondary to MSSA ?In setting of MSSA bacteremia and right upper lobe pneumonia in addition to chronic pannus wound.  Patient empirically treated with antibiotics, vancomycin and cefepime.  Patient transition to cefazolin but developed a rash.  She was transitioned back to vancomycin for treatment of MSSA bacteremia. ?-Continue vancomycin with goal of 14 days total antibiotics (day #14 of treatment on April 19) ? ?MSSA bacteremia ?1/2 sets of blood cultures (4/4) significant for MSSA bacteremia.  Repeat blood cultures (4/6) with no growth.  TTE and TEE without evidence of vegetations.  ID consulted as mentioned above. ?-For antibiotics see problem, sepsis ? ?Chronic panus wound ?Concern for possible source of above.  Wound care consulted. ?-Continue wound care ? ?Rash ?Macular rash.  Unsure of etiology but concern for possible DRESS.  No eosinophilia on CBC with differential.  Possibly antibiotic allergic reaction as she does have a history of hives reaction to penicillin and cephalexin.  Patient started on prednisone with some  improvement of her rash.  Currently not hypotensive.  Currently no other symptoms concerning for anaphylaxis. ? ?Hypotension/Septic shock ?Uncertain etiology but in relation to rash vs infection.  Patient required Levophed for just over 24 hours.  Successfully discontinued.  Random cortisol 4.8 not diagnostic of adrenal insufficiency. Patient also started on midodrine.  Hypotension is significantly improved now. Midodrine discontinued. Blood pressure stable. ? ?Hypokalemia ?Patient given repletion.  Now resolved. ? ?Hypomagnesemia ?Patient given repletion.  Now resolved. ? ?Paroxysmal atrial fibrillation ?Currently in sinus rhythm.  Patient on metoprolol and Eliquis as an outpatient.  Metoprolol is held secondary to hypotension requiring vasopressor support. ?-Continue Eliquis ?-Continue metoprolol ? ?Diabetes mellitus, type 2 ?Patient is on metformin, Jardiance, Lantus as an outpatient.  As hemoglobin A1c of 9.7% on February 14, 2022.  Blood sugars uncontrolled while inpatient.  Fasting blood sugar of 205 today which is likely worsened secondary to initiation of prednisone.  Semglee was previously discontinued secondary to hypoglycemia. ?-Continue SSI ?-Continue Semglee at 20 units nightly ? ?Primary hypertension ?Patient with hypotension ? ?History of DVT ?-Continue Eliquis ? ?CAD ?Hyperlipidemia ?Patient is on Pravastatin and Zetia as an outpatient ?-Continue Zetia and Pravastatin ? ?Hyperthyroidism ?-Continue methimazole ? ?Constipation ?Patient produced multiple bowel movements ?-Continue MiraLAX prn ? ? ?DVT prophylaxis: Eliquis ?Code Status:   Code Status: Full Code ?Family Communication: None at bedside ?Disposition Plan: Discharge home on 4/20 after completion of IV antibiotics. ? ? ?Consultants:  ?Infectious disease ?PCCM ? ?Procedures:  ? ? ?Antimicrobials: ?Vancomycin ?Cefazolin ?Cefepime ? ? ?Subjective: ?No issues this morning.  ? ?Objective: ?BP (!) 121/56 (BP Location: Left Arm)  Pulse 89   Temp 98.8  ?F (37.1 ?C) (Oral)   Resp 16   Ht 5\' 1"  (1.549 m)   Wt 86.5 kg   SpO2 94%   BMI 36.03 kg/m?  ? ?Examination: ? ?General: Well appearing, no distress. Sitting on the side of the bed. ? ? ?Data Reviewed: I have personally reviewed following labs and imaging studies ? ?CBC ?Lab Results  ?Component Value Date  ? WBC 8.2 02/26/2022  ? RBC 3.84 (L) 02/26/2022  ? HGB 10.4 (L) 02/26/2022  ? HCT 33.7 (L) 02/26/2022  ? MCV 87.8 02/26/2022  ? MCH 27.1 02/26/2022  ? PLT 546 (H) 02/26/2022  ? MCHC 30.9 02/26/2022  ? RDW 16.5 (H) 02/26/2022  ? LYMPHSABS 1.2 02/20/2022  ? MONOABS 0.7 02/20/2022  ? EOSABS 0.0 02/20/2022  ? BASOSABS 0.0 02/20/2022  ? ? ? ?Last metabolic panel ?Lab Results  ?Component Value Date  ? NA 135 02/24/2022  ? K 4.6 02/24/2022  ? CL 98 02/24/2022  ? CO2 33 (H) 02/24/2022  ? BUN 11 02/24/2022  ? CREATININE 0.55 02/26/2022  ? GLUCOSE 156 (H) 02/24/2022  ? GFRNONAA >60 02/26/2022  ? GFRAA >60 12/17/2019  ? CALCIUM 8.9 02/24/2022  ? PHOS 2.4 (L) 02/16/2022  ? PROT 6.1 (L) 02/22/2022  ? ALBUMIN 2.2 (L) 02/22/2022  ? BILITOT 0.5 02/22/2022  ? ALKPHOS 60 02/22/2022  ? AST 10 (L) 02/22/2022  ? ALT 6 02/22/2022  ? ANIONGAP 4 (L) 02/24/2022  ? ? ?GFR: ?Estimated Creatinine Clearance: 63.5 mL/min (by C-G formula based on SCr of 0.55 mg/dL). ? ?Recent Results (from the past 240 hour(s))  ?MRSA Next Gen by PCR, Nasal     Status: Abnormal  ? Collection Time: 02/18/22 11:08 PM  ? Specimen: Nasal Mucosa; Nasal Swab  ?Result Value Ref Range Status  ? MRSA by PCR Next Gen DETECTED (A) NOT DETECTED Final  ?  Comment: (NOTE) ?The GeneXpert MRSA Assay (FDA approved for NASAL specimens only), ?is one component of a comprehensive MRSA colonization surveillance ?program. It is not intended to diagnose MRSA infection nor to guide ?or monitor treatment for MRSA infections. ?Test performance is not FDA approved in patients less than 2 years ?old. ?Performed at [facility], [address] ?[place],  [place] ?  ?  ? ? ?Radiology Studies: ?No results found. ? ? ? LOS: 13 days  ? ? ?[id number], MD ?[facility] ?02/27/2022, 10:33 AM ? ? ?If 7PM-7AM, please contact night-coverage [email or web address] ? ?

## 2022-02-28 DIAGNOSIS — J189 Pneumonia, unspecified organism: Secondary | ICD-10-CM | POA: Diagnosis not present

## 2022-02-28 DIAGNOSIS — T50905A Adverse effect of unspecified drugs, medicaments and biological substances, initial encounter: Secondary | ICD-10-CM | POA: Diagnosis not present

## 2022-02-28 DIAGNOSIS — R7881 Bacteremia: Secondary | ICD-10-CM | POA: Diagnosis not present

## 2022-02-28 DIAGNOSIS — E119 Type 2 diabetes mellitus without complications: Secondary | ICD-10-CM | POA: Diagnosis not present

## 2022-02-28 LAB — CBC
HCT: 30.9 % — ABNORMAL LOW (ref 36.0–46.0)
Hemoglobin: 9.9 g/dL — ABNORMAL LOW (ref 12.0–15.0)
MCH: 26.8 pg (ref 26.0–34.0)
MCHC: 32 g/dL (ref 30.0–36.0)
MCV: 83.7 fL (ref 80.0–100.0)
Platelets: 447 10*3/uL — ABNORMAL HIGH (ref 150–400)
RBC: 3.69 MIL/uL — ABNORMAL LOW (ref 3.87–5.11)
RDW: 16.6 % — ABNORMAL HIGH (ref 11.5–15.5)
WBC: 10.1 10*3/uL (ref 4.0–10.5)
nRBC: 0 % (ref 0.0–0.2)

## 2022-02-28 LAB — GLUCOSE, CAPILLARY
Glucose-Capillary: 274 mg/dL — ABNORMAL HIGH (ref 70–99)
Glucose-Capillary: 231 mg/dL — ABNORMAL HIGH (ref 70–99)
Glucose-Capillary: 238 mg/dL — ABNORMAL HIGH (ref 70–99)
Glucose-Capillary: 238 mg/dL — ABNORMAL HIGH (ref 70–99)

## 2022-02-28 LAB — BASIC METABOLIC PANEL
Anion gap: 5 (ref 5–15)
BUN: 13 mg/dL (ref 8–23)
CO2: 27 mmol/L (ref 22–32)
Calcium: 8.8 mg/dL — ABNORMAL LOW (ref 8.9–10.3)
Chloride: 101 mmol/L (ref 98–111)
Creatinine, Ser: 0.63 mg/dL (ref 0.44–1.00)
GFR, Estimated: >60 mL/min (ref >60)
Glucose, Bld: 226 mg/dL — ABNORMAL HIGH (ref 70–99)
Potassium: 4.2 mmol/L (ref 3.5–5.1)
Sodium: 133 mmol/L — ABNORMAL LOW (ref 135–145)

## 2022-02-28 MED ORDER — INSULIN GLARGINE-YFGN 100 UNIT/ML ~~LOC~~ SOLN
30.0000 [IU] | Freq: Every day | SUBCUTANEOUS | Status: DC
  Administered 2022-02-28: 30 [IU] via SUBCUTANEOUS
  Filled 2022-02-28: qty 0.3

## 2022-02-28 NOTE — Progress Notes (Signed)
? ?PROGRESS NOTE ? ? ? ?Kelly Lee  [id number] DOB: Oct 28, 1951 DOA: 02/14/2022 ?PCP: Theodosia Blender, PA-C ? ? ?Brief Narrative: ?71 y.o. female with medical history significant of HTN, DM2. Presenting with weakness. She reports that she has felt weak for at least the last 4 days. She has had poor PO intake during that time. She has had nausea, but no vomiting. She reports productive cough, but no fever. She tried APAP, advil and allergy meds to help; however, they didn't work. In addition, she has notice some draining from her pannus wound. When she didn't feel better today, she called EMS to take her to the ED for evaluation. ? ? ?Assessment and Plan: ? ?Right upper lobe pneumonia ?Noted on initial imaging.  Patient initially started on Levaquin was transitioned to antibiotics to cover MSSA bacteremia as mentioned below.  Patient has completed antibiotic course for pneumonia. ? ?Sepsis secondary to MSSA ?In setting of MSSA bacteremia and right upper lobe pneumonia in addition to chronic pannus wound.  Patient empirically treated with antibiotics, vancomycin and cefepime.  Patient transition to cefazolin but developed a rash.  She was transitioned back to vancomycin for treatment of MSSA bacteremia. ?-Continue vancomycin with goal of 14 days total antibiotics (day #14 of treatment on April 19) ? ?MSSA bacteremia ?1/2 sets of blood cultures (4/4) significant for MSSA bacteremia.  Repeat blood cultures (4/6) with no growth.  TTE and TEE without evidence of vegetations.  ID consulted as mentioned above. ?-For antibiotics see problem, sepsis ? ?Chronic panus wound ?Concern for possible source of above.  Wound care consulted. ?-Continue wound care ? ?Rash ?Macular rash.  Unsure of etiology but concern for possible DRESS.  No eosinophilia on CBC with differential.  Possibly antibiotic allergic reaction as she does have a history of hives reaction to penicillin and cephalexin.  Patient started on prednisone with some  improvement of her rash.  Currently not hypotensive.  Currently no other symptoms concerning for anaphylaxis. ? ?Hypotension/Septic shock ?Uncertain etiology but in relation to rash vs infection.  Patient required Levophed for just over 24 hours.  Successfully discontinued.  Random cortisol 4.8 not diagnostic of adrenal insufficiency. Patient also started on midodrine.  Hypotension is significantly improved now. Midodrine discontinued. Blood pressure stable. ? ?Hypokalemia ?Patient given repletion.  Now resolved. ? ?Hypomagnesemia ?Patient given repletion.  Now resolved. ? ?Paroxysmal atrial fibrillation ?Currently in sinus rhythm.  Patient on metoprolol and Eliquis as an outpatient.  Metoprolol is held secondary to hypotension requiring vasopressor support. Patient with intermittent palpitations. ?-Continue Eliquis ?-Continue metoprolol ? ?Diabetes mellitus, type 2 ?Patient is on metformin, Jardiance, Lantus 40 units qhs as an outpatient.  As hemoglobin A1c of 9.7% on February 14, 2022.  Blood sugars uncontrolled while inpatient.  Fasting blood sugar of 274 today. Semglee was previously discontinued secondary to hypoglycemia. ?-Continue SSI ?-Increase to Semglee at 30 units nightly ? ?Primary hypertension ?Patient with hypotension ? ?History of DVT ?-Continue Eliquis ? ?CAD ?Hyperlipidemia ?Patient is on Pravastatin and Zetia as an outpatient ?-Continue Zetia and Pravastatin ? ?Hyperthyroidism ?-Continue methimazole ? ?Constipation ?Patient produced multiple bowel movements ?-Continue MiraLAX prn ? ? ?DVT prophylaxis: Eliquis ?Code Status:   Code Status: Full Code ?Family Communication: None at bedside ?Disposition Plan: Discharge home on 4/20 after completion of IV antibiotics. ? ? ?Consultants:  ?Infectious disease ?PCCM ? ?Procedures:  ? ? ?Antimicrobials: ?Vancomycin ?Cefazolin ?Cefepime ? ? ?Subjective: ?Patient reports having intermittent palpitations yesterday through the day which have now  resolved. ? ?Objective: ?  BP 105/61 (BP Location: Left Arm)   Pulse 93   Temp 97.6 ?F (36.4 ?C) (Oral)   Resp 18   Ht 5\' 1"  (1.549 m)   Wt 86.5 kg   SpO2 94%   BMI 36.03 kg/m?  ? ?Examination: ? ?General exam: Appears calm and comfortable ?Respiratory system: Clear to auscultation. Respiratory effort normal. ?Cardiovascular system: S1 & S2 heard, RRR. No murmurs, rubs, gallops or clicks. ?Gastrointestinal system: Abdomen is nondistended, soft and nontender. Normal bowel sounds heard. ?Central nervous system: Alert and oriented. No focal neurological deficits. ?Musculoskeletal: No edema. No calf tenderness ?Skin: No cyanosis. No rashes ?Psychiatry: Judgement and insight appear normal. Mood & affect appropriate.  ? ? ?Data Reviewed: I have personally reviewed following labs and imaging studies ? ?CBC ?Lab Results  ?Component Value Date  ? WBC 10.1 02/28/2022  ? RBC 3.69 (L) 02/28/2022  ? HGB 9.9 (L) 02/28/2022  ? HCT 30.9 (L) 02/28/2022  ? MCV 83.7 02/28/2022  ? MCH 26.8 02/28/2022  ? PLT 447 (H) 02/28/2022  ? MCHC 32.0 02/28/2022  ? RDW 16.6 (H) 02/28/2022  ? LYMPHSABS 1.2 02/20/2022  ? MONOABS 0.7 02/20/2022  ? EOSABS 0.0 02/20/2022  ? BASOSABS 0.0 02/20/2022  ? ? ? ?Last metabolic panel ?Lab Results  ?Component Value Date  ? NA 133 (L) 02/28/2022  ? K 4.2 02/28/2022  ? CL 101 02/28/2022  ? CO2 27 02/28/2022  ? BUN 13 02/28/2022  ? CREATININE 0.63 02/28/2022  ? GLUCOSE 226 (H) 02/28/2022  ? GFRNONAA >60 02/28/2022  ? GFRAA >60 12/17/2019  ? CALCIUM 8.8 (L) 02/28/2022  ? PHOS 2.4 (L) 02/16/2022  ? PROT 6.1 (L) 02/22/2022  ? ALBUMIN 2.2 (L) 02/22/2022  ? BILITOT 0.5 02/22/2022  ? ALKPHOS 60 02/22/2022  ? AST 10 (L) 02/22/2022  ? ALT 6 02/22/2022  ? ANIONGAP 5 02/28/2022  ? ? ?GFR: ?Estimated Creatinine Clearance: 63.5 mL/min (by C-G formula based on SCr of 0.63 mg/dL). ? ?Recent Results (from the past 240 hour(s))  ?MRSA Next Gen by PCR, Nasal     Status: Abnormal  ? Collection Time: 02/18/22 11:08 PM  ?  Specimen: Nasal Mucosa; Nasal Swab  ?Result Value Ref Range Status  ? MRSA by PCR Next Gen DETECTED (A) NOT DETECTED Final  ?  Comment: (NOTE) ?The GeneXpert MRSA Assay (FDA approved for NASAL specimens only), ?is one component of a comprehensive MRSA colonization surveillance ?program. It is not intended to diagnose MRSA infection nor to guide ?or monitor treatment for MRSA infections. ?Test performance is not FDA approved in patients less than 2 years ?old. ?Performed at [facility], [address] ?[place], [place] [address] ?  ?  ? ? ?Radiology Studies: ?No results found. ? ? ? LOS: 14 days  ? ? ?Cordelia Poche, MD ?[facility] ?02/28/2022, 8:21 AM ? ? ?If 7PM-7AM, please contact night-coverage ?[email or web address] ? ?

## 2022-03-01 DIAGNOSIS — E669 Obesity, unspecified: Secondary | ICD-10-CM

## 2022-03-01 LAB — GLUCOSE, CAPILLARY
Glucose-Capillary: 250 mg/dL — ABNORMAL HIGH (ref 70–99)
Glucose-Capillary: 292 mg/dL — ABNORMAL HIGH (ref 70–99)
Glucose-Capillary: 220 mg/dL — ABNORMAL HIGH (ref 70–99)
Glucose-Capillary: 276 mg/dL — ABNORMAL HIGH (ref 70–99)

## 2022-03-01 MED ORDER — INSULIN GLARGINE-YFGN 100 UNIT/ML ~~LOC~~ SOLN
38.0000 [IU] | Freq: Every day | SUBCUTANEOUS | Status: DC
  Administered 2022-03-01: 38 [IU] via SUBCUTANEOUS
  Filled 2022-03-01 (×2): qty 0.38

## 2022-03-01 NOTE — Plan of Care (Signed)
?  Problem: Activity: ?Goal: Ability to tolerate increased activity will improve ?Outcome: Progressing ?  ?Problem: Clinical Measurements: ?Goal: Ability to maintain a body temperature in the normal range will improve ?Outcome: Progressing ?  ?Problem: Clinical Measurements: ?Goal: Ability to maintain clinical measurements within normal limits will improve ?Outcome: Progressing ?  ?Problem: Activity: ?Goal: Risk for activity intolerance will decrease ?Outcome: Progressing ?  ?Problem: Pain Managment: ?Goal: General experience of comfort will improve ?Outcome: Progressing ?  ?Problem: Safety: ?Goal: Ability to remain free from injury will improve ?Outcome: Progressing ?  ?Problem: Skin Integrity: ?Goal: Risk for impaired skin integrity will decrease ?Outcome: Progressing ?  ?

## 2022-03-01 NOTE — Care Management Important Message (Signed)
Important Message ? ?Patient Details IM Letter given to the Patient ?Name: Kelly Lee ?MRN: [id number] ?Date of Birth: 20-Jul-1951 ? ? ?Medicare Important Message Given:  Yes ? ? ? ? ?Caren Macadam ?03/01/2022, 3:12 PM ?

## 2022-03-01 NOTE — TOC Transition Note (Signed)
Transition of Care (TOC) - CM/SW Discharge Note ? ? ?Patient Details  ?Name: Kelly Lee ?MRN: [id number] ?Date of Birth: 09/13/1951 ? ?Transition of Care (TOC) CM/SW Contact:  ?Renaldo Gornick, LCSW ?Phone Number: ?03/01/2022, 1:47 PM ? ? ?Clinical Narrative:    ? ?Per MD, pt should be medically ready for dc home tomorrow.  Pt aware and agreeable.  Orders placed for HHPT and pt reports she has been followed by [facility] - referral sent and accepted for re-start of services.  No further TOC needs. ? ?Final next level of care: Home w Home Health Services ?Barriers to Discharge: Barriers Resolved ? ? ?Patient Goals and CMS Choice ?Patient states their goals for this hospitalization and ongoing recovery are:: return home ?  ?  ? ?Discharge Placement ?  ?           ?  ?  ?  ?  ? ?Discharge Plan and Services ?In-house Referral: Clinical Social Work ?  ?           ?DME Arranged: N/A ?DME Agency: NA ?  ?  ?  ?HH Arranged: PT ?HH Agency: [facility] ?Date HH Agency Contacted: 03/01/22 ?Time HH Agency Contacted: [phone] ?Representative spoke with at [facility] Agency: Vangie Bicker ? ?Social Determinants of Health (SDOH) Interventions ?  ? ? ?Readmission Risk Interventions ? ?  12/12/2019  ?  1:40 PM  ?Readmission Risk Prevention Plan  ?Transportation Screening Complete  ?PCP or Specialist Appt within 5-7 Days Not Complete  ?Not Complete comments plan for SNF  ?Home Care Screening Complete  ?Medication Review (RN CM) Referral to Pharmacy  ? ? ? ? ? ?

## 2022-03-01 NOTE — Progress Notes (Signed)
Physical Therapy Treatment ?Patient Details ?Name: Kelly Lee ?MRN: [id number] ?DOB: [birth year] ?Today's Date: 03/01/2022 ? ? ?History of Present Illness 71 y.o. female Presenting with weakness on 02/14/22. Patient found to have pna, drainage from pannus wound. Pt with medical history significant of HTN, DM2. ? ?  ?PT Comments  ? ? General Comments: AxO x 3 very pleasant and motivated.  Assisted OOB to amb to bathroom then in hallway went well.  General Gait Details: First assisted to bathroom.  Pt did well using her walker navigating around room and in hallway. Decreased amb distance this session of 85 feet due to 2/4 dyspnea.  Avg RA was 96%.  Required 3 standing rest breaks.  Otherwise, tolerated well. Assisted to EOB as lunch arrived. ?Pt plans to return home with daughter.    ?Recommendations for follow up therapy are one component of a multi-disciplinary discharge planning process, led by the attending physician.  Recommendations may be updated based on patient status, additional functional criteria and insurance authorization. ? ?Follow Up Recommendations ? Home health PT ?  ?  ?Assistance Recommended at Discharge Intermittent Supervision/Assistance  ?Patient can return home with the following Help with stairs or ramp for entrance;Assistance with cooking/housework;Assist for transportation;A little help with walking and/or transfers ?  ?Equipment Recommendations ? None recommended by PT  ?  ?Recommendations for Other Services   ? ? ?  ?Precautions / Restrictions Precautions ?Precautions: None ?Restrictions ?Weight Bearing Restrictions: No  ?  ? ?Mobility ? Bed Mobility ?Overal bed mobility: Modified Independent ?  ?  ?  ?  ?  ?  ?General bed mobility comments: Pt mod I for increased time ?  ? ?Transfers ?Overall transfer level: Needs assistance ?Equipment used: Rolling walker (2 wheels) ?Transfers: Sit to/from Stand ?Sit to Stand: Supervision ?  ?  ?  ?  ?  ?General transfer comment: Pt supervision for safety  only, no physical assist required. ?  ? ?Ambulation/Gait ?Ambulation/Gait assistance: Supervision, Min guard ?Gait Distance (Feet): 85 Feet ?Assistive device: Rolling walker (2 wheels) ?Gait Pattern/deviations: Step-through pattern, Trunk flexed, Knee flexed in stance - right, Narrow base of support ?Gait velocity: decreasde ?  ?  ?General Gait Details: First assisted to bathroom.  Pt did well using her walker navigating around room and in hallway. Decreased amb distance this session of 85 feet due to 2/4 dyspnea.  Avg RA was 96%.  Required 3 standing rest breaks.  Otherwise, tolerated well. ? ? ?Stairs ?  ?  ?  ?  ?  ? ? ?Wheelchair Mobility ?  ? ?Modified Rankin (Stroke Patients Only) ?  ? ? ?  ?Balance   ?  ?  ?  ?  ?  ?  ?  ?  ?  ?  ?  ?  ?  ?  ?  ?  ?  ?  ?  ? ?  ?Cognition Arousal/Alertness: Awake/alert ?Behavior During Therapy: [facility] for tasks assessed/performed ?Overall Cognitive Status: Within Functional Limits for tasks assessed ?  ?  ?  ?  ?  ?  ?  ?  ?  ?  ?  ?  ?  ?  ?  ?  ?General Comments: AxO x 3 very pleasant and motivated ?  ?  ? ?  ?Exercises   ? ?  ?General Comments   ?  ?  ? ?Pertinent Vitals/Pain Pain Assessment ?Pain Assessment: No/denies pain  ? ? ?Home Living   ?  ?  ?  ?  ?  ?  ?  ?  ?  ?   ?  ?  Prior Function    ?  ?  ?   ? ?PT Goals (current goals can now be found in the care plan section) Progress towards PT goals: Progressing toward goals ? ?  ?Frequency ? ? ? Min 3X/week ? ? ? ?  ?PT Plan Current plan remains appropriate  ? ? ?Co-evaluation   ?  ?  ?  ?  ? ?  ?AM-PAC PT "6 Clicks" Mobility   ?Outcome Measure ? Help needed turning from your back to your side while in a flat bed without using bedrails?: None ?Help needed moving from lying on your back to sitting on the side of a flat bed without using bedrails?: A Little ?Help needed moving to and from a bed to a chair (including a wheelchair)?: A Little ?Help needed standing up from a chair using your arms (e.g., wheelchair or bedside  chair)?: A Little ?Help needed to walk in hospital room?: A Little ?Help needed climbing 3-5 steps with a railing? : A Little ?6 Click Score: 19 ? ?  ?End of Session Equipment Utilized During Treatment: Gait belt ?Activity Tolerance: Patient tolerated treatment well ?Patient left: in bed;with call bell/phone within reach ?Nurse Communication: Mobility status ?PT Visit Diagnosis: Unsteadiness on feet (R26.81) ?  ? ? ?Time: [id number] ?PT Time Calculation (min) (ACUTE ONLY): 13 min ? ?Charges:  $Gait Training: 8-22 mins          ?          ? ?Rica Koyanagi  PTA ?Acute  Rehabilitation Services ?Pager      [phone] ?Office      [phone] ? ? ?

## 2022-03-01 NOTE — Progress Notes (Signed)
?PROGRESS NOTE ?Kelly Lee  [id number] DOB: Nov 10, 1951 DOA: 02/14/2022 ?PCP: Theodosia Blender, PA-C  ? ?Brief Narrative/Hospital Course: ?71 y.o. female with medical history significant of HTN, DM2. Presenting with weakness. She reports that she has felt weak for at least the last 4 days. She has had poor PO intake during that time. She has had nausea, but no vomiting. She reports productive cough, but no fever. She tried APAP, advil and allergy meds to help; however, they didn't work. In addition, she has notice some draining from her pannus wound. When she didn't feel better today, she called EMS to take her to the ED for evaluation. ?Patient was treated for hypotension/septic shock,right upper lobe pneumonia sepsis MSSA suspected from pneumonia-TTE and TEE without evidence of vegetation seen by ID, advised antibiotics through April 19 to complete 14 days course.  Patient with chronic pannus wound also noted macular rash unsure etiology.  ?  ?Subjective: ?Seen and examined.  Having her breakfast, reports she only has mild itching from the rash and almost resolved. ?No fever overnight, labs blood sugar  uncontrolled in >200 ? ?Assessment and Plan: ?Principal Problem: ?  PNA (pneumonia) ?Active Problems: ?  Diabetes mellitus without complication ([place]) ?  Hypertension ?  Anticoagulant long-term use ?  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  Dyslipidemia ?  Hyperthyroidism ?  PAF (paroxysmal atrial fibrillation) ([place]) ?  Anemia ?  History of DVT (deep vein thrombosis) ?  Hypokalemia ?  Hypocalcemia ?  Community acquired pneumonia ?  Bacteremia ?  Fever ?  Septic shock ([place]) ?  Drug reaction ?  Obesity, Class II, BMI 35-39.9 ? ?Right upper lobe PNA  ?MSSA Sepsis: ?Patient had extensive work-up as outlined above with TTE, TEE, repeat blood culture-so far on remarkable.  Source likely from pneumonia.  Seen by ID.  Patient will be complete vancomycin  4/19-pharmacy dosing. ? ?Septic shock/hypotension: Unclear etiology in relation to rash versus infection, required Levophed x24 hours has been weaned off random cortisol 4.8 not diagnostic of adrenal insufficiency, was on midodrine, hypotension at this time has resolved off midodrine. ? ?Rash: Macular rash uncertain etiology concern for possible  DRESS.  No eosinophilia on CBC with differential.  Possibly antibiotic allergic reaction as she does have a history of hives reaction to penicillin and cephalexin.  Patient was started on prednisone with some improvement of her rash.  Off prednisone.  Has mild itching continue symptomatic management ? ?Hypokalemia/hypomagnesemia: Electrolytes stable ? ?PAF ?History of DVT: ?Currently NSR, rate controlled, on metoprolol, anticoagulated on Eliquis. ? ?Primary hypertension ?CAD ?HLD: ?BP well controlled at times in 90s.  No chest pain.  Continue on his Toprol 25 twice daily, Zetia, Plavix, Eliquis 5 mg ? ?Hypothyroidism-on methimazole ? ?Constipation continue MiraLAX prn ? ?Anemia hemoglobin 9.9 g range.  Likely from chronic disease.  Needs PCP follow-up ? ?Type 2 diabetes with uncontrolled hyperglycemia,HBA1C 9.7 April 4.  On metformin Jardiance and Lantus 40 units at home-increase to 38 units, cont ssi and monitor. ?Recent Labs  ?Lab 02/28/22 ?0806 02/28/22 ?1149 02/28/22 ?1637 02/28/22 ?2220 03/01/22 ?0737  ?GLUCAP 274* 231* 238* 238* 250*  ?  ?Class II Obesity:Patient's Body mass index is 36.03 kg/m?. : Will benefit with PCP follow-up, weight loss  healthy lifestyle and outpatient sleep evaluation. ? ? ?DVT prophylaxis: elliquis ?Code Status:   Code Status: Full Code ?Family Communication: plan of care discussed with patient at bedside. ?Patient status is: inpatient Level of care: Med-Surg  ?  Remains inpatient because: Ongoing IV antibiotics ?Patient currently not stable ? ?Dispo: The patient is from: Home.  Current activity level 6- independent ?           Anticipated  disposition: Home with home health, order entered. ? ?Mobility Assessment (last 72 hours)   ? ? Mobility Assessment   ? ? [place] Name 02/28/22 [phone] 02/27/22 2000 02/27/22 1135 02/27/22 0840 02/26/22 2100  ? Does patient have an order for bedrest or is patient medically unstable No - Continue assessment No - Continue assessment -- No - Continue assessment Yes- Bedfast (Level 1) - Complete  ? What is the highest level of mobility based on the progressive mobility assessment? Level 6 (Walks independently in room and hall) - Balance while walking in room without assist - Complete Level 5 (Walks with assist in room/hall) - Balance while stepping forward/back and can walk in room with assist - Complete Level 5 (Walks with assist in room/hall) - Balance while stepping forward/back and can walk in room with assist - Complete Level 5 (Walks with assist in room/hall) - Balance while stepping forward/back and can walk in room with assist - Complete Level 5 (Walks with assist in room/hall) - Balance while stepping forward/back and can walk in room with assist - Complete  ? ?  ?  ? ?  ?  ? ?Objective: ?Vitals last 24 hrs: ?Vitals:  ? 02/28/22 0639 02/28/22 1338 02/28/22 2053 03/01/22 0619  ?BP: 105/61 94/66 (!) 114/59 118/67  ?Pulse: 93 91 96 88  ?Resp: 18 18 14 16   ?Temp: 97.6 ?F (36.4 ?C) 97.6 ?F (36.4 ?C) 98.6 ?F (37 ?C) 98 ?F (36.7 ?C)  ?TempSrc: Oral Oral Oral Oral  ?SpO2: 94% 100% 100% 98%  ?Weight:      ?Height:      ? ?Weight change:  ? ?Physical Examination: ?General exam: AA,0x3older than stated age, weak appearing. ?HEENT:Oral mucosa moist, Ear/Nose WNL grossly, dentition normal. ?Respiratory system: bilaterally clear BS, no use of accessory muscle ?Cardiovascular system: S1 & S2 +, No JVD,. ?Gastrointestinal system: Abdomen soft,NT,ND, BS+ ?Nervous System:Alert, awake, moving extremities and grossly nonfocal ?Extremities: LE edema none,distal peripheral pulses palpable.  ?Skin: faint reddish macular rash on  extremities,no icterus. ?MSK: Normal muscle bulk,tone, power ? ?Medications reviewed:  ?Scheduled Meds: ? apixaban  5 mg Oral BID  ? calcium carbonate  1 tablet Oral BID  ? clopidogrel  75 mg Oral Daily  ? ezetimibe  10 mg Oral QHS  ? famotidine  20 mg Oral BID  ? guaiFENesin  600 mg Oral BID  ? insulin aspart  0-20 Units Subcutaneous TID WC  ? insulin aspart  0-5 Units Subcutaneous QHS  ? insulin glargine-yfgn  38 Units Subcutaneous QHS  ? methimazole  5 mg Oral Daily  ? metoprolol succinate  25 mg Oral BID  ? pantoprazole  40 mg Oral QAC breakfast  ? sodium chloride flush  10-40 mL Intracatheter Q12H  ? ?Continuous Infusions: ? vancomycin 1,250 mg (03/01/22 0548)  ? ? ?  ?Diet Order   ? ?       ?  Diet Heart Room service appropriate? Yes; Fluid consistency: Thin  Diet effective now       ?  ? ?  ?  ? ?  ? ?Unresulted Labs (From admission, onward)  ? ?  Start     Ordered  ? 03/02/22 0500  Creatinine, serum  Every 48 hours,   R     ?Question:  Specimen  collection method  Answer:  Lab=Lab collect  ? 02/27/22 1037  ? ?  ?  ? ?  ?Data Reviewed: I have personally reviewed following labs and imaging studies ?CBC: ?Recent Labs  ?Lab 03-01-2022 ?0500 02/28/22 ?0315  ?WBC 8.2 10.1  ?HGB 10.4* 9.9*  ?HCT 33.7* 30.9*  ?MCV 87.8 83.7  ?PLT 546* 447*  ? ?Basic Metabolic Panel: ?Recent Labs  ?Lab 02/24/22 ?0428 2022/03/01 ?0500 02/28/22 ?0315  ?NA 135  --  133*  ?K 4.6  --  4.2  ?CL 98  --  101  ?CO2 33*  --  27  ?GLUCOSE 156*  --  226*  ?BUN 11  --  13  ?CREATININE 0.57 0.55 0.63  ?CALCIUM 8.9  --  8.8*  ? ?GFR: ?Estimated Creatinine Clearance: 63.5 mL/min (by C-G formula based on SCr of 0.63 mg/dL). ?Liver Function Tests: ?No results for input(s): AST, ALT, ALKPHOS, BILITOT, PROT, ALBUMIN in the last 168 hours. ?No results for input(s): LIPASE, AMYLASE in the last 168 hours. ?No results for input(s): AMMONIA in the last 168 hours. ?Coagulation Profile: ?No results for input(s): INR, PROTIME in the last 168 hours. ?BNP (last 3  results) ?No results for input(s): PROBNP in the last 8760 hours. ?HbA1C: ?No results for input(s): HGBA1C in the last 72 hours. ?CBG: ?Recent Labs  ?Lab 02/28/22 ?0806 02/28/22 ?1149 02/28/22 ?1637 02/28/22 ?2220 03/01/22

## 2022-03-02 LAB — CREATININE, SERUM
Creatinine, Ser: 0.58 mg/dL (ref 0.44–1.00)
GFR, Estimated: >60 mL/min (ref >60)

## 2022-03-02 LAB — GLUCOSE, CAPILLARY
Glucose-Capillary: 171 mg/dL — ABNORMAL HIGH (ref 70–99)
Glucose-Capillary: 200 mg/dL — ABNORMAL HIGH (ref 70–99)

## 2022-03-02 NOTE — Progress Notes (Signed)
Discharge instructions discussed with patient and family, verbalized agreement and understanding 

## 2022-03-02 NOTE — Discharge Summary (Signed)
Physician Discharge Summary  ?Kelly Lee GYK:[id number] DOB: 1951/03/30 DOA: 02/14/2022 ? ?PCP: Oval Linsey, PA-C ? ?Admit date: 02/14/2022 ?Discharge date: 03/02/2022 ?Recommendations for Outpatient Follow-up:  ?Follow up with PCP in 1 weeks-call for appointment ?Please obtain BMP/CBC in one week ? ?Discharge Dispo: home /w hh ?Discharge Condition: Stable ?Code Status:   Code Status: Full Code ?Diet recommendation:  ?Diet Order   ? ?       ?  Diet Heart Room service appropriate? Yes; Fluid consistency: Thin  Diet effective now       ?  ? ?  ?  ? ?  ?  ? ?Brief/Interim Summary: ?71 y.o. female with medical history significant of HTN, DM2. Presenting with weakness. She reports that she has felt weak for at least the last 4 days. She has had poor PO intake during that time. She has had nausea, but no vomiting. She reports productive cough, but no fever. She tried APAP, advil and allergy meds to help; however, they didn't work. In addition, she has notice some draining from her pannus wound. When she didn't feel better today, she called EMS to take her to the ED for evaluation. ?Patient was treated for hypotension/septic shock,right upper lobe pneumonia sepsis MSSA suspected from pneumonia-TTE and TEE without evidence of vegetation seen by ID, advised antibiotics through April 19 to complete 14 days course.  Patient with chronic pannus wound also noted macular rash unsure etiology.  Patient completed IV antibiotics/19, patient is medically stable and is being discharged home with home health  ? ?Discharge Diagnoses:  ?Principal Problem: ?  PNA (pneumonia) ?Active Problems: ?  Diabetes mellitus without complication (HCC) ?  Hypertension ?  Anticoagulant long-term use ?  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  Dyslipidemia ?  Hyperthyroidism ?  PAF (paroxysmal atrial fibrillation) (HCC) ?  Anemia ?  History of DVT (deep vein thrombosis) ?  Hypokalemia ?   Hypocalcemia ?  Community acquired pneumonia ?  Bacteremia ?  Fever ?  Septic shock (HCC) ?  Drug reaction ?  Obesity, Class II, BMI 35-39.9 ? ?Right upper lobe PNA  ?MSSA Sepsis: ?Patient had extensive work-up as outlined above with TTE, TEE, repeat blood culture-so far on remarkable.  Source likely from pneumonia.  Seen by ID.  Patient Completed antibiotics 03/01/22, at this time afebrile and remains medically stable for discharge to home ?  ?Septic shock/hypotension: Unclear etiology in relation to rash versus infection, required Levophed x24 hours has been weaned off random cortisol 4.8 not diagnostic of adrenal insufficiency, was on midodrine, hypotension at this time has resolved off midodrine. ?  ?Rash: Macular rash uncertain etiology concern for possible  DRESS.  No eosinophilia on CBC with differential.  Possibly antibiotic allergic reaction as she does have a history of hives reaction to penicillin and cephalexin.  Patient was started on prednisone with some improvement of her rash.  Off prednisone.  Has mild itching continue symptomatic management.  Overall improved ?  ?Hypokalemia/hypomagnesemia: StablePAF ?History of DVT: ?Currently NSR, rate controlled, on metoprolol, anticoagulated on Eliquis. ?  ?Primary hypertension ?CAD ?HLD: ?BP well controlled at times in 90s.  No chest pain.  Continue on his Toprol 25 twice daily, Zetia, Plavix, Eliquis 5 mg ?  ?Hypothyroidism-on methimazole ?  ?Constipation continue MiraLAX prn ?  ?Anemia hemoglobin 9.9 g range.  Likely from chronic disease.  Needs PCP follow-up ?  ?Type 2 diabetes with uncontrolled hyperglycemia,HBA1C 9.7 April 4.  On  metformin Jardiance and Lantus 40 units at home-increase to 38 units-can resume home dose upon discharge ?Recent Labs  ?Lab 03/01/22 ?[id number] 03/01/22 ?1138 03/01/22 ?1617 03/01/22 ?2243 03/02/22 ?0731  ?GLUCAP 250* 292* 220* 276* 171*  ?  ?Class II Obesity:Patient's Body mass index is 36.03 kg/m?. : Will benefit with PCP follow-up,  weight loss  healthy lifestyle and outpatient sleep evaluation. ? ?Consults: ?PCCM, infectious disease, wound care. ? ?Subjective: ?Seen and examined this morning.  Resting comfortably.  Looking forward to going home today.   ? ?Discharge Exam: ?Vitals:  ? 03/01/22 2138 03/02/22 0534  ?BP: (!) 128/54 108/62  ?Pulse: 90 95  ?Resp: 16 18  ?Temp: 97.7 ?F (36.5 ?C) 97.8 ?F (36.6 ?C)  ?SpO2: (!) 84% 97%  ? ?General: Pt is alert, awake, not in acute distress ?Cardiovascular: RRR, S1/S2 +, no rubs, no gallops ?Respiratory: CTA bilaterally, no wheezing, no rhonchi ?Abdominal: Soft, NT, ND, bowel sounds + ?Extremities: no edema, no cyanosis ? ?Discharge Instructions ? ?Discharge Instructions   ? ? Discharge instructions   Complete by: As directed ?  ? PCP follow-up in 1 week, CBC BMP in 1 week. ? ?Please call call MD or return to ER for similar or worsening recurring problem that brought you to hospital or if any fever,nausea/vomiting,abdominal pain, uncontrolled pain, chest pain,  shortness of breath or any other alarming symptoms. ? ?Please follow-up your doctor as instructed in a week time and call the office for appointment. ? ?Please avoid alcohol, smoking, or any other illicit substance and maintain healthy habits including taking your regular medications as prescribed. ? ?You were cared for by a hospitalist during your hospital stay. If you have any questions about your discharge medications or the care you received while you were in the hospital after you are discharged, you can call the unit and ask to speak with the hospitalist on call if the hospitalist that took care of you is not available. ? ?Once you are discharged, your primary care physician will handle any further medical issues. Please note that NO REFILLS for any discharge medications will be authorized once you are discharged, as it is imperative that you return to your primary care physician (or establish a relationship with a primary care physician if  you do not have one) for your aftercare needs so that they can reassess your need for medications and monitor your lab values  ? Discharge wound care:   Complete by: As directed ?  ? Apply moist 2X2 gauze to right abd wound Q day, using swab to fill, then cover with foam dressing.  (Change foam dressing Q 3 days or PRN soiling.)  ? Increase activity slowly   Complete by: As directed ?  ? ?  ? ?Allergies as of 03/02/2022   ? ?   Reactions  ? Penicillins Hives, Shortness Of Breath  ? Cefazolin In Sodium Chloride Rash  ? Previously tolerated cefazolin in 2022 but developed full body rash in April 2023 after several days of cefazolin   ? Azithromycin Hives  ? Cephalexin Hives  ? Silvadene [silver Sulfadiazine] Other (See Comments)  ? "Burns" the skin  ? Chlorhexidine Rash  ? Wound Dressing Adhesive Rash  ? ?  ? ?  ?Medication List  ?  ? ?STOP taking these medications   ? ?oxyCODONE-acetaminophen 5-325 MG tablet ?Commonly known as: Percocet ?  ?rosuvastatin 20 MG tablet ?Commonly known as: CRESTOR ?  ? ?  ? ?TAKE these medications   ? ?Advil  200 MG Caps ?Generic drug: Ibuprofen ?Take 400 mg by mouth every 6 (six) hours as needed (for headaches or pain). ?  ?albuterol 108 (90 Base) MCG/ACT inhaler ?Commonly known as: VENTOLIN HFA ?Inhale 2 puffs into the lungs every 6 (six) hours as needed for wheezing or shortness of breath. ?  ?apixaban 5 MG Tabs tablet ?Commonly known as: ELIQUIS ?Take 1 tablet (5 mg total) by mouth 2 (two) times daily. ?  ?clopidogrel 75 MG tablet ?Commonly known as: PLAVIX ?Take 75 mg by mouth daily. ?  ?diphenhydrAMINE 25 MG tablet ?Commonly known as: BENADRYL ?Take 25 mg by mouth every 6 (six) hours as needed for allergies. ?  ?empagliflozin 10 MG Tabs tablet ?Commonly known as: JARDIANCE ?Take 10 mg by mouth daily. ?  ?ezetimibe 10 MG tablet ?Commonly known as: ZETIA ?Take 10 mg by mouth at bedtime. ?  ?fluticasone 50 MCG/ACT nasal spray ?Commonly known as: FLONASE ?Place 1 spray into both nostrils  daily as needed for allergies. ?  ?furosemide 40 MG tablet ?Commonly known as: LASIX ?Take 40 mg by mouth in the morning. ?  ?Lantus SoloStar 100 UNIT/ML Solostar Pen ?Generic drug: insulin glargine

## 2022-04-03 NOTE — Progress Notes (Unsigned)
[facility] [place] PROGRESS NOTE  ASSESSMENT / PLAN: Kelly Lee is a 71 y.o. female status post right lower extremity arterial thrombectomy and calf fasciotomy  12/14/21 for acute limb threatening ischemia from likely cardiac thrombus. Wounds healing appropriately. I removed the staples from the groin and medial calf incision. The lateral calf is healing slowly but appropriately.  Continue wet-to-dry dressings to the lateral calf.  Follow-up with me in 6 months with ABI.  Patient must continue anticoagulation indefinitely.  SUBJECTIVE: Patient continues to recover slowly.  She reports slow healing of the lateral calf wound.  She is a bit debilitated.  She reports a fungal infection in her right groin.  She has no other complaints.  OBJECTIVE: There were no vitals taken for this visit.  Constitutional: Elderly.  Chronically ill-appearing.  In no distress Cardiac: Regular rate and rhythm. Pulmonary: Unlabored breathing Abdomen: Soft abdomen Vascular: Right groin incision healed.  Calf fasciotomy is healing appropriately.  Medial wound healed.  Lateral wound healing.  Healthy granulation tissue at the base of the wound.  2+ dorsalis pedis pulse.     Latest Ref Rng & Units 02/28/2022    3:15 AM 02/26/2022    5:00 AM 02/22/2022    6:00 AM  CBC  WBC 4.0 - 10.5 K/uL 10.1   8.2   8.2    Hemoglobin 12.0 - 15.0 g/dL 9.9   10.4   8.5    Hematocrit 36.0 - 46.0 % 30.9   33.7   27.6    Platelets 150 - 400 K/uL 447   546   382          Latest Ref Rng & Units 03/02/2022    5:24 AM 02/28/2022    3:15 AM 02/26/2022    5:00 AM  CMP  Glucose 70 - 99 mg/dL  226     BUN 8 - 23 mg/dL  13     Creatinine 0.44 - 1.00 mg/dL 0.58   0.63   0.55    Sodium 135 - 145 mmol/L  133     Potassium 3.5 - 5.1 mmol/L  4.2     Chloride 98 - 111 mmol/L  101     CO2 22 - 32 mmol/L  27     Calcium 8.9 - 10.3 mg/dL  8.8       +-------+-----------+-----------+------------+------------+   ABI/TBIToday's ABIToday's TBIPrevious ABIPrevious TBI  +-------+-----------+-----------+------------+------------+  Right  1.13       0.71                                 +-------+-----------+-----------+------------+------------+  Left   1.17       0.83                                 +-------+-----------+-----------+------------+------------+   Kelly Aline. Stanford Breed, MD [facility] Phone Number: [phone] 04/03/2022 10:28 PM

## 2022-04-04 ENCOUNTER — Ambulatory Visit (HOSPITAL_COMMUNITY)
Admission: RE | Admit: 2022-04-04 | Discharge: 2022-04-04 | Disposition: A | Discharge status: Home / Self Care | Payer: Medicare Other | Source: Ambulatory Visit | Attending: Vascular Surgery | Admitting: Vascular Surgery

## 2022-04-04 ENCOUNTER — Ambulatory Visit (INDEPENDENT_AMBULATORY_CARE_PROVIDER_SITE_OTHER): Payer: Medicare Other | Admitting: Vascular Surgery

## 2022-04-04 ENCOUNTER — Encounter: Payer: Self-pay | Admitting: Vascular Surgery

## 2022-04-04 VITALS — BP 137/61 | HR 58 | Temp 98.3°F | Resp 20 | Ht 61.0 in | Wt 169.0 lb

## 2022-04-04 DIAGNOSIS — I70229 Atherosclerosis of native arteries of extremities with rest pain, unspecified extremity: Secondary | ICD-10-CM | POA: Diagnosis present

## 2022-04-04 DIAGNOSIS — Z9889 Other specified postprocedural states: Secondary | ICD-10-CM | POA: Diagnosis not present

## 2022-04-04 DIAGNOSIS — I739 Peripheral vascular disease, unspecified: Secondary | ICD-10-CM | POA: Diagnosis not present

## 2022-04-07 ENCOUNTER — Other Ambulatory Visit: Payer: Self-pay | Admitting: *Deleted

## 2022-04-07 DIAGNOSIS — I70229 Atherosclerosis of native arteries of extremities with rest pain, unspecified extremity: Secondary | ICD-10-CM

## 2022-04-07 DIAGNOSIS — I739 Peripheral vascular disease, unspecified: Secondary | ICD-10-CM

## 2022-05-15 IMAGING — CT CT ANGIO CHEST
2 of 7 series · 17 of 46 positions shown · IV contrast (APPLIED)
Comparison: CT a chest 11/20/20 at [HOSPITAL] [place]

CLINICAL DATA: Pulmonary embolism suspected. New cor pulmonale on
2D echo concerning for pulmonary embolus.

EXAM:
CT ANGIOGRAPHY CHEST WITH CONTRAST
TECHNIQUE: Multidetector CT imaging of the chest was performed using the
standard protocol during bolus administration of intravenous
contrast. Multiplanar CT image reconstructions and MIPs were
obtained to evaluate the vascular anatomy.

[Series 5: thins · axial · 0.80mm/px · z∈[-433,-171]mm · 15 of 300 slices shown]
[im 19/300  lung]
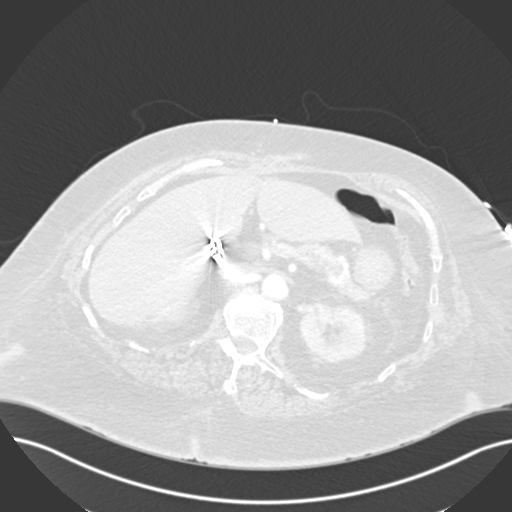
[im 38/300  soft-tissue]
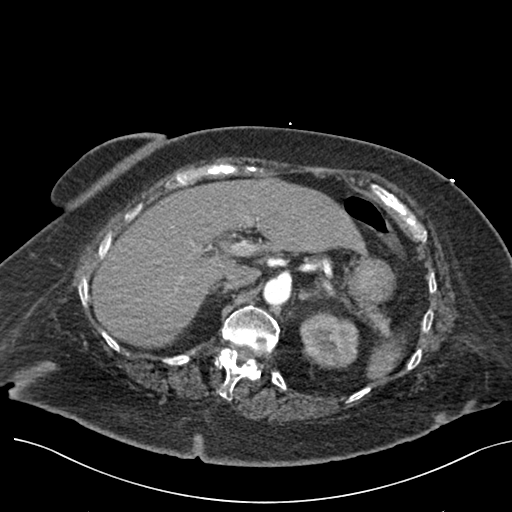
[im 57/300  lung]
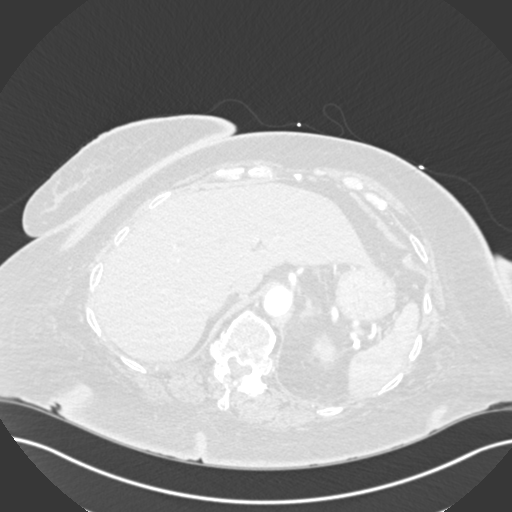
[im 75/300  soft-tissue]
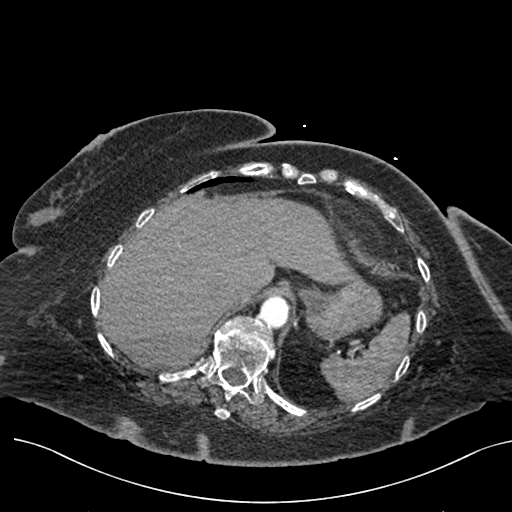
[im 94/300  lung]
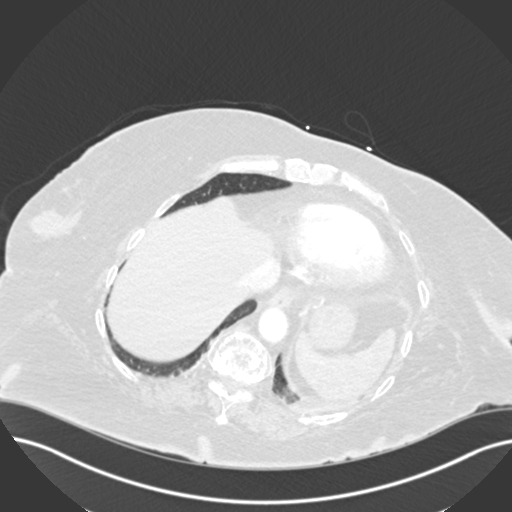
[im 113/300  soft-tissue]
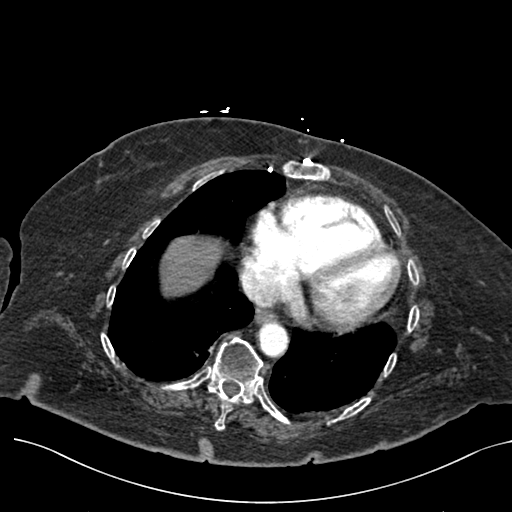
[im 131/300  lung]
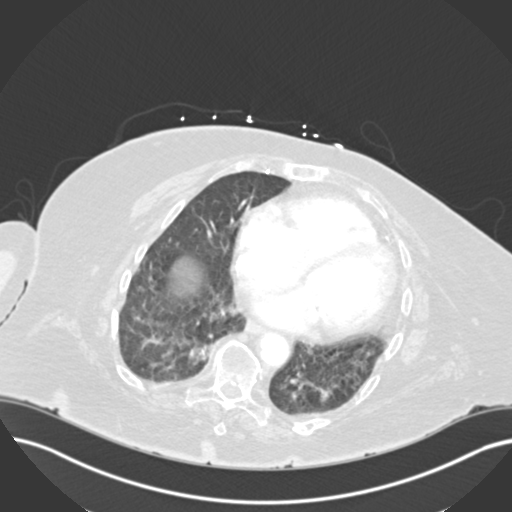
[im 150/300  soft-tissue]
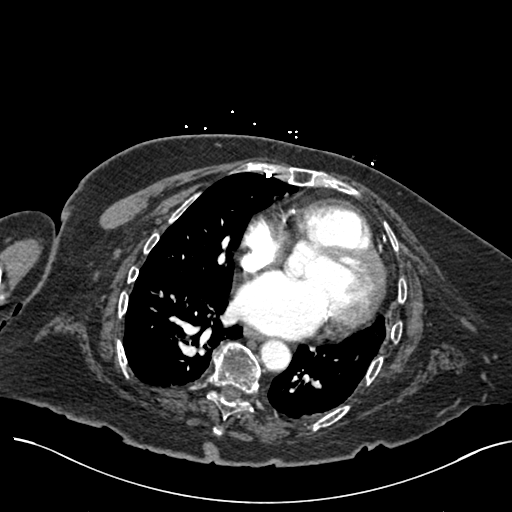
[im 169/300  lung]
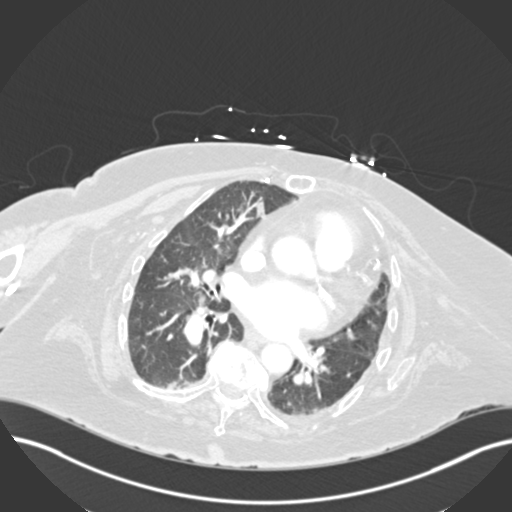
[im 187/300  soft-tissue]
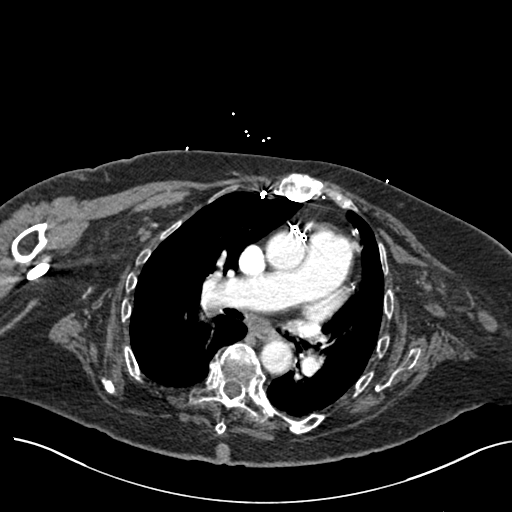
[im 206/300  lung]
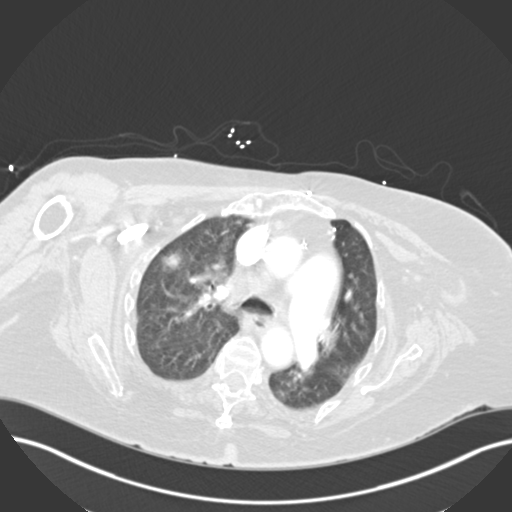
[im 225/300  soft-tissue]
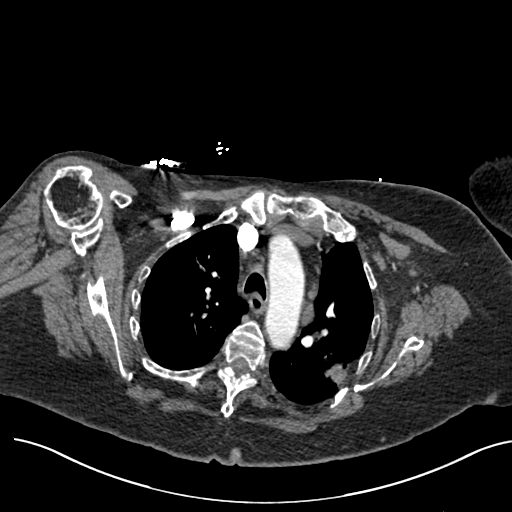
[im 243/300  lung]
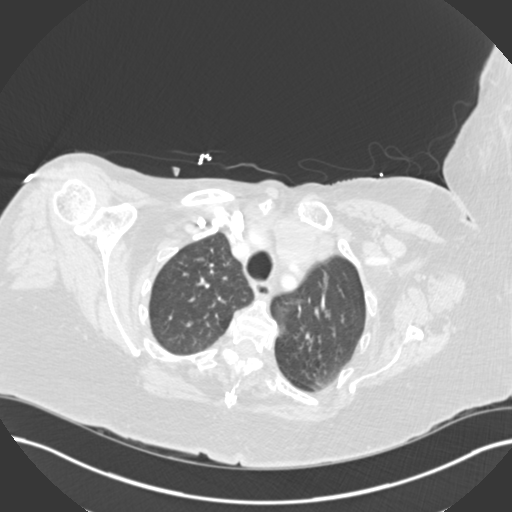
[im 262/300  soft-tissue]
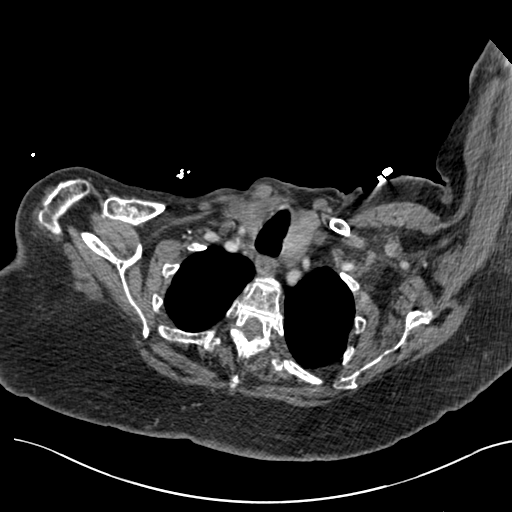
[im 281/300  lung]
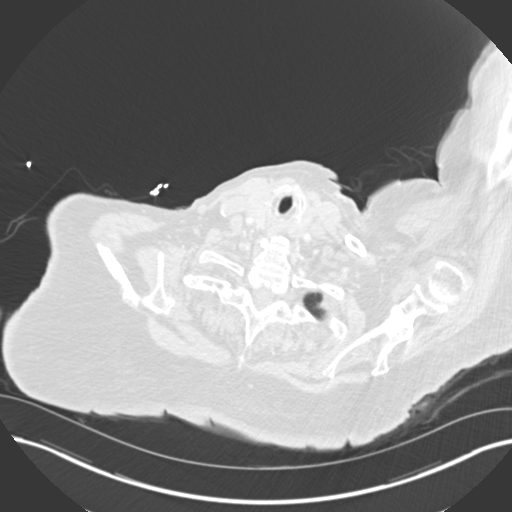

[Series 6: coronal mpr · coronal · 0.62mm/px · 2 of 91 slices shown]
[im 31/91  soft-tissue]
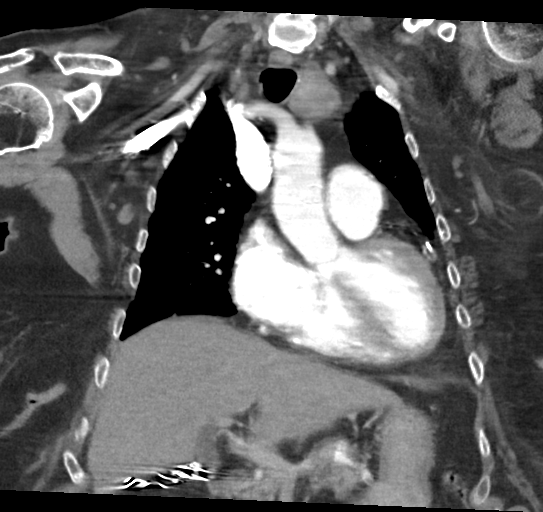
[im 61/91  soft-tissue]
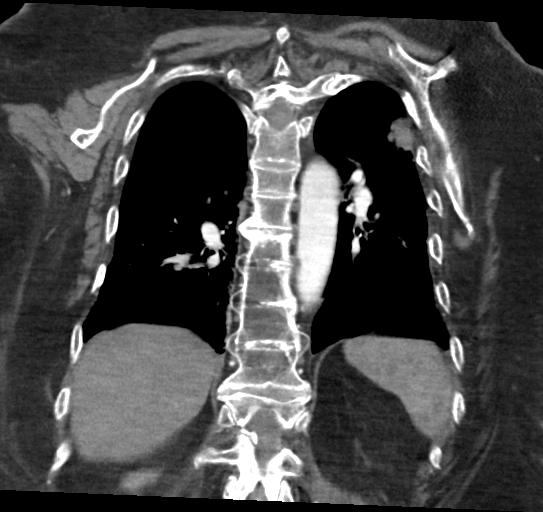

[17 of 46 positions shown; findings below may reference images not displayed]

RADIATION DOSE REDUCTION: This exam was performed according to the
departmental dose-optimization program which includes automated
exposure control, adjustment of the mA and/or kV according to
patient size and/or use of iterative reconstruction technique.

CONTRAST:  75mL OMNIPAQUE IOHEXOL 350 MG/ML SOLN
FINDINGS: Cardiovascular: Overall heart size is normal. Prominent right
ventricle noted is suggested by ECHO. Patient is status post median
sternotomy for CABG. Dense coronary artery calcifications are
present. No significant pericardial effusion is present. Nerve root
arch and great vessels are within normal limits.

Pulmonary artery opacification is excellent. No focal filling
defects are present to suggest pulmonary emboli. Pulmonary artery
size is within normal limits.

Mediastinum/Nodes: Enlarged mediastinal nodes are stable.
Prevascular node measures 10 mm short axis. Precarinal node measures
14 mm. No new adenopathy is present. No significant hilar adenopathy
or axillary adenopathy is present.

Lungs/Pleura: Patchy airspace opacities are present bilaterally.
Focal consolidation is present in the posterolateral left upper lobe
along the fissure. No discrete nodules are present. A small left
pleural effusion is present. No pneumothorax is present.

Upper Abdomen: Patient is status post cholecystectomy. Upper abdomen
is otherwise unremarkable.

Musculoskeletal: Degenerative changes are again noted within the
thoracic and upper lumbar spine. Exaggerated thoracic kyphosis
noted. T12 superior endplate fracture is stable. No new fractures
are present. Remote left-sided rib fractures are present. No acute
rib fractures are present. No chest wall mass.

Review of the MIP images confirms the above findings.
IMPRESSION: 1. No pulmonary embolus.
2. Patchy airspace opacities bilaterally with more focal
consolidation in the posterolateral left upper lobe. Findings are
consistent with multifocal pneumonia. Question viral pneumonia.
3. Small left pleural effusion.
4. Stable mediastinal adenopathy, likely reactive.
5. Coronary artery disease.
6. Remote left-sided rib fractures.
7. Stable T12 superior endplate fracture.

## 2022-05-16 IMAGING — DX DG CHEST 1V PORT
1 series · 1 of 1 positions shown · non-contrast
Comparison: CTA chest yesterday at [DATE] p.m.

CLINICAL DATA: Central line positioning film.

EXAM:
PORTABLE CHEST 1 VIEW

[chest ap]
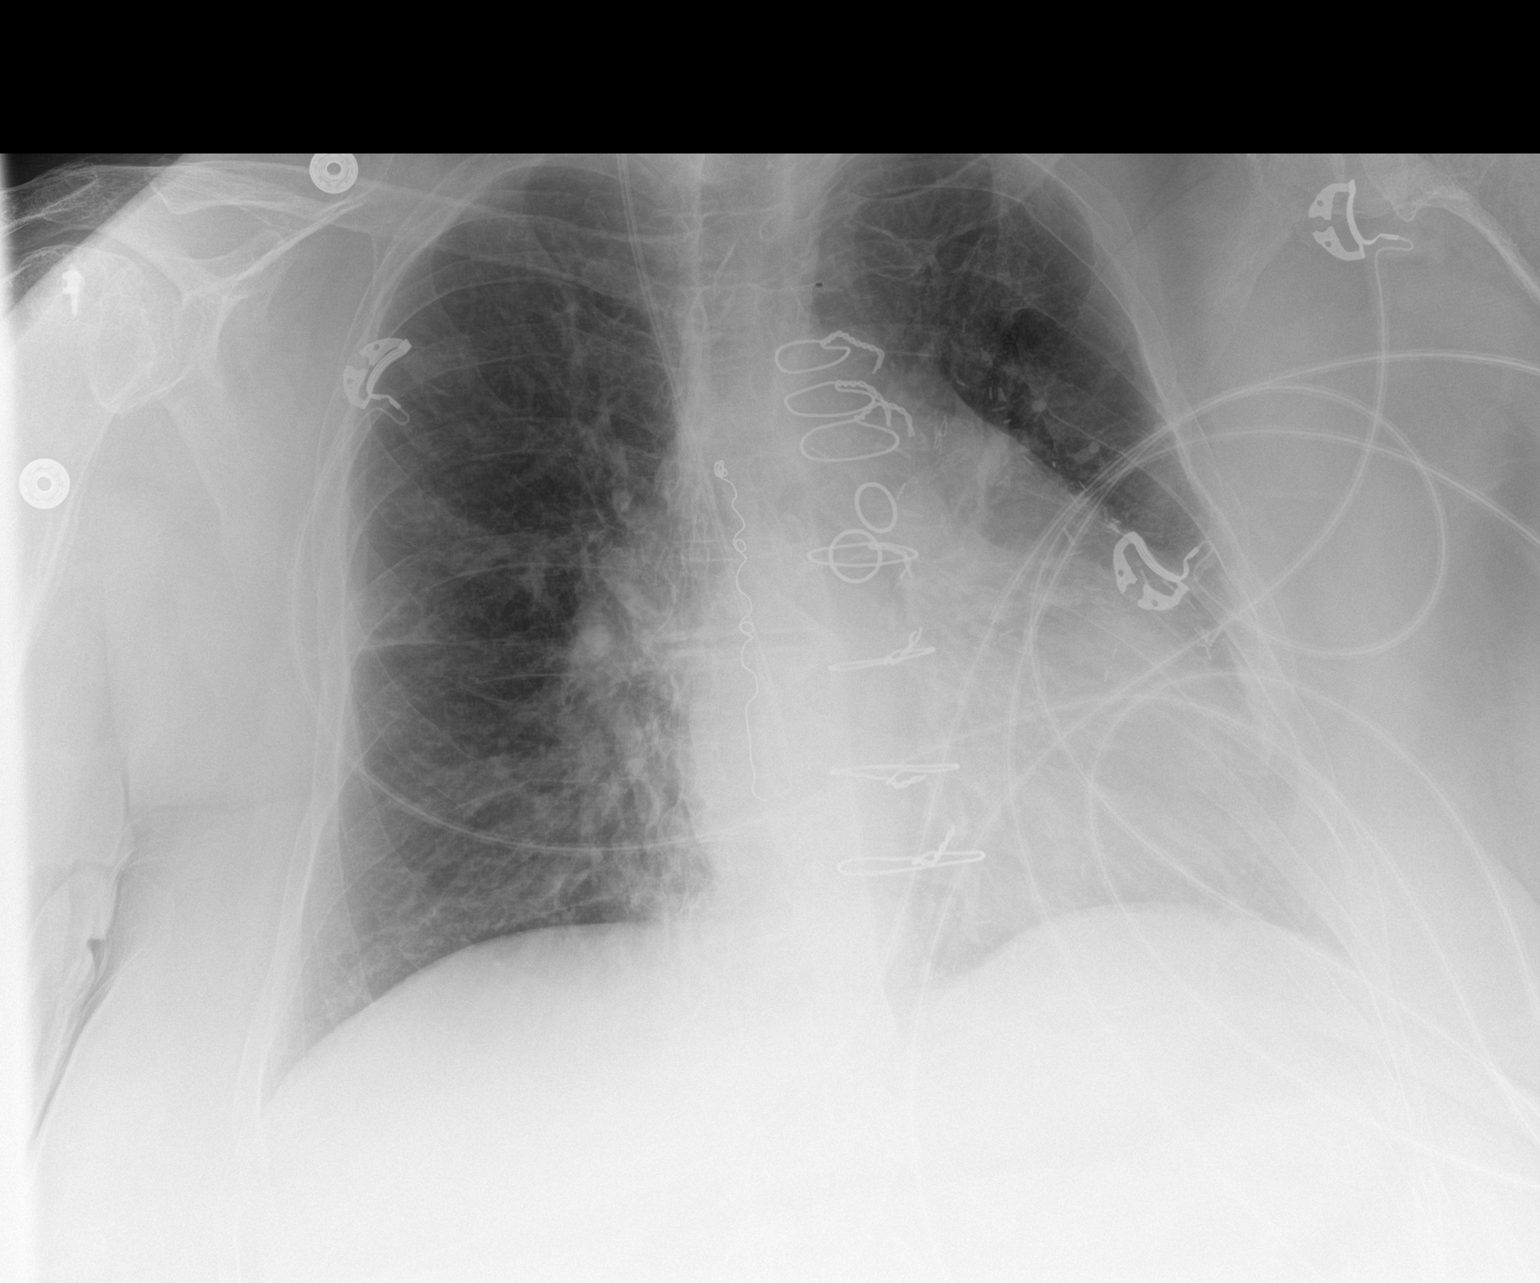

[1 of 1 positions shown; findings below may reference images not displayed]

FINDINGS: [DATE] p.m., 02/18/2022. Scattered hazy airspace disease in the lung
fields is again noted but was better seen on CT.

There is moderate cardiomegaly with old CABG and an embolization
coil in the right internal mammary artery. No vascular congestion is
seen. No pleural effusion.

A right IJ central line is in place. The tip is in the right atrium.
There is osteopenia. No pneumothorax.
IMPRESSION: Right IJ central line tip in the right atrium. No pneumothorax.
Scattered hazy airspace disease.

## 2022-12-02 ENCOUNTER — Encounter (HOSPITAL_COMMUNITY): Payer: Self-pay

## 2022-12-02 ENCOUNTER — Inpatient Hospital Stay: Admit: 2022-12-02 | Payer: 59 | Admitting: Student

## 2022-12-02 DIAGNOSIS — E119 Type 2 diabetes mellitus without complications: Secondary | ICD-10-CM | POA: Diagnosis not present

## 2022-12-02 DIAGNOSIS — I10 Essential (primary) hypertension: Secondary | ICD-10-CM

## 2022-12-02 DIAGNOSIS — L03116 Cellulitis of left lower limb: Secondary | ICD-10-CM | POA: Diagnosis not present

## 2022-12-02 DIAGNOSIS — I4891 Unspecified atrial fibrillation: Secondary | ICD-10-CM | POA: Diagnosis not present

## 2022-12-02 DIAGNOSIS — N183 Chronic kidney disease, stage 3 unspecified: Secondary | ICD-10-CM | POA: Diagnosis not present

## 2022-12-02 DIAGNOSIS — I2510 Atherosclerotic heart disease of native coronary artery without angina pectoris: Secondary | ICD-10-CM

## 2022-12-03 DIAGNOSIS — I4891 Unspecified atrial fibrillation: Secondary | ICD-10-CM | POA: Diagnosis not present

## 2022-12-03 DIAGNOSIS — L03116 Cellulitis of left lower limb: Secondary | ICD-10-CM | POA: Diagnosis not present

## 2022-12-03 DIAGNOSIS — N183 Chronic kidney disease, stage 3 unspecified: Secondary | ICD-10-CM | POA: Diagnosis not present

## 2022-12-03 DIAGNOSIS — Z950 Presence of cardiac pacemaker: Secondary | ICD-10-CM

## 2022-12-03 DIAGNOSIS — E119 Type 2 diabetes mellitus without complications: Secondary | ICD-10-CM | POA: Diagnosis not present

## 2022-12-03 DIAGNOSIS — I4510 Unspecified right bundle-branch block: Secondary | ICD-10-CM

## 2023-01-12 DEATH — deceased
